# Patient Record
Sex: Male | Born: 1965 | Race: White | Hispanic: No | Marital: Married | Smoking: Never smoker
Health system: Southern US, Community
[De-identification: ages and names within clinical notes are randomized; demographics above are authoritative.]

## PROBLEM LIST (undated history)

## (undated) DIAGNOSIS — Z9989 Dependence on other enabling machines and devices: Secondary | ICD-10-CM

## (undated) DIAGNOSIS — G4730 Sleep apnea, unspecified: Secondary | ICD-10-CM

## (undated) DIAGNOSIS — E785 Hyperlipidemia, unspecified: Secondary | ICD-10-CM

## (undated) DIAGNOSIS — E669 Obesity, unspecified: Secondary | ICD-10-CM

## (undated) DIAGNOSIS — G4733 Obstructive sleep apnea (adult) (pediatric): Secondary | ICD-10-CM

## (undated) DIAGNOSIS — E119 Type 2 diabetes mellitus without complications: Secondary | ICD-10-CM

## (undated) DIAGNOSIS — T7840XA Allergy, unspecified, initial encounter: Secondary | ICD-10-CM

## (undated) HISTORY — DX: Obesity, unspecified: E66.9

## (undated) HISTORY — DX: Sleep apnea, unspecified: G47.30

## (undated) HISTORY — PX: TRIGGER FINGER RELEASE: SHX641

## (undated) HISTORY — DX: Type 2 diabetes mellitus without complications: E11.9

## (undated) HISTORY — DX: Hyperlipidemia, unspecified: E78.5

## (undated) HISTORY — DX: Allergy, unspecified, initial encounter: T78.40XA

## (undated) HISTORY — PX: ANAL FISSURE REPAIR: SHX2312

## (undated) HISTORY — DX: Obstructive sleep apnea (adult) (pediatric): G47.33

## (undated) HISTORY — DX: Dependence on other enabling machines and devices: Z99.89

---
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
## 2004-09-01 ENCOUNTER — Other Ambulatory Visit: Payer: Self-pay

## 2004-09-08 ENCOUNTER — Ambulatory Visit: Payer: Self-pay | Admitting: Specialist

## 2007-01-29 DIAGNOSIS — I10 Essential (primary) hypertension: Secondary | ICD-10-CM | POA: Insufficient documentation

## 2007-01-29 DIAGNOSIS — E785 Hyperlipidemia, unspecified: Secondary | ICD-10-CM | POA: Insufficient documentation

## 2007-01-29 HISTORY — DX: Essential (primary) hypertension: I10

## 2007-11-19 ENCOUNTER — Ambulatory Visit: Payer: Self-pay | Admitting: Family Medicine

## 2007-11-19 DIAGNOSIS — L709 Acne, unspecified: Secondary | ICD-10-CM | POA: Insufficient documentation

## 2008-05-21 DIAGNOSIS — Z8042 Family history of malignant neoplasm of prostate: Secondary | ICD-10-CM | POA: Insufficient documentation

## 2010-06-12 DIAGNOSIS — E119 Type 2 diabetes mellitus without complications: Secondary | ICD-10-CM | POA: Insufficient documentation

## 2010-12-01 ENCOUNTER — Ambulatory Visit: Payer: Self-pay | Admitting: Gastroenterology

## 2010-12-01 LAB — HM COLONOSCOPY

## 2010-12-05 LAB — PATHOLOGY REPORT

## 2011-04-11 ENCOUNTER — Ambulatory Visit: Payer: Self-pay | Admitting: Gastroenterology

## 2012-06-13 ENCOUNTER — Ambulatory Visit: Payer: Self-pay | Admitting: Gastroenterology

## 2012-06-18 LAB — PATHOLOGY REPORT

## 2013-08-11 ENCOUNTER — Ambulatory Visit: Payer: Self-pay | Admitting: Family Medicine

## 2013-10-09 ENCOUNTER — Ambulatory Visit: Payer: Self-pay | Admitting: Family Medicine

## 2014-01-22 LAB — HEMOGLOBIN A1C: Hgb A1c MFr Bld: 7.4 % — AB (ref 4.0–6.0)

## 2014-08-01 HISTORY — PX: OTHER SURGICAL HISTORY: SHX169

## 2014-10-18 LAB — LIPID PANEL
Cholesterol: 99 mg/dL (ref 0–200)
HDL: 25 mg/dL — AB (ref 35–70)
LDL Cholesterol: 43 mg/dL
Triglycerides: 154 mg/dL (ref 40–160)

## 2015-03-05 ENCOUNTER — Other Ambulatory Visit: Payer: Self-pay | Admitting: Family Medicine

## 2015-03-14 ENCOUNTER — Encounter: Payer: Self-pay | Admitting: Family Medicine

## 2015-03-14 ENCOUNTER — Encounter (INDEPENDENT_AMBULATORY_CARE_PROVIDER_SITE_OTHER): Payer: Self-pay

## 2015-03-14 ENCOUNTER — Ambulatory Visit (INDEPENDENT_AMBULATORY_CARE_PROVIDER_SITE_OTHER): Payer: BC Managed Care – PPO | Admitting: Family Medicine

## 2015-03-14 VITALS — BP 122/82 | HR 78 | Temp 98.8°F | Resp 16 | Ht 72.0 in | Wt 256.1 lb

## 2015-03-14 DIAGNOSIS — E785 Hyperlipidemia, unspecified: Secondary | ICD-10-CM | POA: Diagnosis not present

## 2015-03-14 DIAGNOSIS — E349 Endocrine disorder, unspecified: Secondary | ICD-10-CM

## 2015-03-14 DIAGNOSIS — I10 Essential (primary) hypertension: Secondary | ICD-10-CM | POA: Diagnosis not present

## 2015-03-14 DIAGNOSIS — Z8371 Family history of colonic polyps: Secondary | ICD-10-CM | POA: Insufficient documentation

## 2015-03-14 DIAGNOSIS — E669 Obesity, unspecified: Secondary | ICD-10-CM

## 2015-03-14 DIAGNOSIS — E119 Type 2 diabetes mellitus without complications: Secondary | ICD-10-CM

## 2015-03-14 DIAGNOSIS — E668 Other obesity: Secondary | ICD-10-CM | POA: Insufficient documentation

## 2015-03-14 DIAGNOSIS — K2100 Gastro-esophageal reflux disease with esophagitis, without bleeding: Secondary | ICD-10-CM | POA: Insufficient documentation

## 2015-03-14 DIAGNOSIS — Z8639 Personal history of other endocrine, nutritional and metabolic disease: Secondary | ICD-10-CM

## 2015-03-14 DIAGNOSIS — R809 Proteinuria, unspecified: Secondary | ICD-10-CM | POA: Insufficient documentation

## 2015-03-14 HISTORY — DX: Obesity, unspecified: E66.9

## 2015-03-14 LAB — GLUCOSE, POCT (MANUAL RESULT ENTRY): POC Glucose: 169 mg/dl — AB (ref 70–99)

## 2015-03-14 LAB — POCT GLYCOSYLATED HEMOGLOBIN (HGB A1C): Hemoglobin A1C: 7.2

## 2015-03-14 MED ORDER — LOSARTAN POTASSIUM 100 MG PO TABS
100.0000 mg | ORAL_TABLET | Freq: Every day | ORAL | Status: DC
Start: 2015-03-14 — End: 2015-12-05

## 2015-03-14 MED ORDER — ICOSAPENT ETHYL 1 G PO CAPS: 2.0000 mg | ORAL_CAPSULE | Freq: Two times a day (BID) | ORAL | Status: DC

## 2015-03-14 MED ORDER — GLIMEPIRIDE 2 MG PO TABS: 2.0000 mg | ORAL_TABLET | Freq: Every day | ORAL | Status: DC

## 2015-03-14 MED ORDER — INVOKANA 300 MG PO TABS
300.0000 mg | ORAL_TABLET | Freq: Every day | ORAL | Status: DC
Start: 2015-03-14 — End: 2015-11-15

## 2015-03-14 NOTE — Patient Instructions (Signed)
sietDiabetes Mellitus and Food It is important for you to manage your blood sugar (glucose) level. Your blood glucose level can be greatly affected by what you eat. Eating healthier foods in the appropriate amounts throughout the day at about the same time each day will help you control your blood glucose level. It can also help slow or prevent worsening of your diabetes mellitus. Healthy eating may even help you improve the level of your blood pressure and reach or maintain a healthy weight.  HOW CAN FOOD AFFECT ME? Carbohydrates Carbohydrates affect your blood glucose level more than any other type of food. Your dietitian will help you determine how many carbohydrates to eat at each meal and teach you how to count carbohydrates. Counting carbohydrates is important to keep your blood glucose at a healthy level, especially if you are using insulin or taking certain medicines for diabetes mellitus. Alcohol Alcohol can cause sudden decreases in blood glucose (hypoglycemia), especially if you use insulin or take certain medicines for diabetes mellitus. Hypoglycemia can be a life-threatening condition. Symptoms of hypoglycemia (sleepiness, dizziness, and disorientation) are similar to symptoms of having too much alcohol.  If your health care provider has given you approval to drink alcohol, do so in moderation and use the following guidelines:  Women should not have more than one drink per day, and men should not have more than two drinks per day. One drink is equal to:  12 oz of beer.  5 oz of wine.  1 oz of hard liquor.  Do not drink on an empty stomach.  Keep yourself hydrated. Have water, diet soda, or unsweetened iced tea.  Regular soda, juice, and other mixers might contain a lot of carbohydrates and should be counted. WHAT FOODS ARE NOT RECOMMENDED? As you make food choices, it is important to remember that all foods are not the same. Some foods have fewer nutrients per serving than other  foods, even though they might have the same number of calories or carbohydrates. It is difficult to get your body what it needs when you eat foods with fewer nutrients. Examples of foods that you should avoid that are high in calories and carbohydrates but low in nutrients include:  Trans fats (most processed foods list trans fats on the Nutrition Facts label).  Regular soda.  Juice.  Candy.  Sweets, such as cake, pie, doughnuts, and cookies.  Fried foods. WHAT FOODS CAN I EAT? Have nutrient-rich foods, which will nourish your body and keep you healthy. The food you should eat also will depend on several factors, including:  The calories you need.  The medicines you take.  Your weight.  Your blood glucose level.  Your blood pressure level.  Your cholesterol level. You also should eat a variety of foods, including:  Protein, such as meat, poultry, fish, tofu, nuts, and seeds (lean animal proteins are best).  Fruits.  Vegetables.  Dairy products, such as milk, cheese, and yogurt (low fat is best).  Breads, grains, pasta, cereal, rice, and beans.  Fats such as olive oil, trans fat-free margarine, canola oil, avocado, and olives. DOES EVERYONE WITH DIABETES MELLITUS HAVE THE SAME MEAL PLAN? Because every person with diabetes mellitus is different, there is not one meal plan that works for everyone. It is very important that you meet with a dietitian who will help you create a meal plan that is just right for you. Document Released: 06/14/2005 Document Revised: 09/22/2013 Document Reviewed: 08/14/2013 [facility] Patient Information 2015 [place], [place]. This  information is not intended to replace advice given to you by your health care provider. Make sure you discuss any questions you have with your health care provider.  

## 2015-03-14 NOTE — Progress Notes (Signed)
Name: Ryan Ritter   MRN: [id number]    DOB: [birth year]   Date:03/14/2015       Progress Note  Subjective  Chief Complaint  Chief Complaint  Patient presents with  . Hypertension  . Diabetes  . Hyperlipidemia  . Obesity  . Sleep Apnea    Hypertension This is a chronic problem. The current episode started more than 1 year ago. The problem is controlled. Pertinent negatives include no blurred vision, chest pain, headaches, neck pain, orthopnea, palpitations or shortness of breath. There are no associated agents to hypertension. Risk factors for coronary artery disease include diabetes mellitus, dyslipidemia, obesity and sedentary lifestyle. Past treatments include angiotensin blockers. The current treatment provides moderate improvement. There is no history of kidney disease, CAD/MI, CVA or PVD.  Diabetes He presents for his follow-up diabetic visit. He has type 2 diabetes mellitus. His disease course has been worsening. There are no hypoglycemic associated symptoms. Pertinent negatives for hypoglycemia include no dizziness, headaches, nervousness/anxiousness, seizures or tremors. There are no diabetic associated symptoms. Pertinent negatives for diabetes include no blurred vision, no chest pain, no weakness and no weight loss. Symptoms are worsening. There are no diabetic complications. Pertinent negatives for diabetic complications include no CVA or PVD. Current diabetic treatment includes oral agent (triple therapy). He is compliant with treatment all of the time. His weight is decreasing steadily. He is following a diabetic diet. He rarely participates in exercise. His home blood glucose trend is decreasing steadily. His breakfast blood glucose range is generally 130-140 mg/dl. His highest blood glucose is 180-200 mg/dl. An ACE inhibitor/angiotensin II receptor blocker is being taken. He does not see a podiatrist.Eye exam is current.  Hyperlipidemia This is a chronic problem. The current episode  started more than 1 year ago. The problem is uncontrolled. Recent lipid tests were reviewed and are variable. Exacerbating diseases include diabetes and obesity. Pertinent negatives include no chest pain, focal weakness, myalgias or shortness of breath. Current antihyperlipidemic treatment includes statins (VASCEPA).      Past Medical History  Diagnosis Date  . Sleep apnea   . Allergy   . Hyperlipidemia   . Diabetes mellitus without complication   . Hypertension     History  Substance Use Topics  . Smoking status: Never Smoker   . Smokeless tobacco: Not on file  . Alcohol Use: No     Current outpatient prescriptions:  .  ANDROGEL PUMP 20.25 MG/ACT (1.62%) GEL, , Disp: , Rfl:  .  CRESTOR 20 MG tablet, , Disp: , Rfl:  .  doxycycline (VIBRAMYCIN) 100 MG capsule, , Disp: , Rfl:  .  glimepiride (AMARYL) 2 MG tablet, , Disp: , Rfl:  .  INVOKANA 300 MG TABS tablet, , Disp: , Rfl:  .  KOMBIGLYZE XR 01-999 MG TB24, , Disp: , Rfl:  .  levocetirizine (XYZAL) 5 MG tablet, , Disp: , Rfl:  .  losartan (COZAAR) 100 MG tablet, , Disp: , Rfl:  .  VASCEPA 1 G CAPS, TAKE 2 CAPSULES EVERY MORNING AND 2 CAPSULES EVERY EVENING, Disp: 120 capsule, Rfl: 1 .  VICTOZA 18 MG/3ML SOPN, , Disp: , Rfl:   Allergies  Allergen Reactions  . Benicar [Olmesartan] Other (See Comments)  . Lisinopril Other (See Comments)    Review of Systems  Constitutional: Negative for fever, chills and weight loss.  HENT: Negative for congestion, hearing loss, sore throat and tinnitus.   Eyes: Negative for blurred vision, double vision and redness.  Respiratory: Negative for  cough, hemoptysis and shortness of breath.   Cardiovascular: Negative for chest pain, palpitations, orthopnea, claudication and leg swelling.  Gastrointestinal: Negative for heartburn, nausea, vomiting, diarrhea, constipation and blood in stool.  Genitourinary: Negative for dysuria, urgency, frequency and hematuria.  Musculoskeletal: Negative for  myalgias, back pain, joint pain, falls and neck pain.  Skin: Negative for itching.  Neurological: Negative for dizziness, tingling, tremors, focal weakness, seizures, loss of consciousness, weakness and headaches.  Endo/Heme/Allergies: Does not bruise/bleed easily.  Psychiatric/Behavioral: Negative for depression and substance abuse. The patient is not nervous/anxious and does not have insomnia.      Objective  Filed Vitals:   03/14/15 0835  BP: 122/82  Pulse: 78  Temp: 98.8 F (37.1 C)  Resp: 16  Height: 6' (1.829 m)  Weight: 256 lb 1 oz (116.149 kg)  SpO2: 99%     Physical Exam  Constitutional: He is oriented to person, place, and time and well-developed, well-nourished, and in no distress.  HENT:  Head: Normocephalic.  Eyes: EOM are normal. Pupils are equal, round, and reactive to light.  Neck: Normal range of motion. Neck supple. No thyromegaly present.  Cardiovascular: Normal rate, regular rhythm and normal heart sounds.   No murmur heard. Pulmonary/Chest: Effort normal and breath sounds normal. No respiratory distress. He has no wheezes.  Abdominal: Soft. Bowel sounds are normal.  Musculoskeletal: Normal range of motion. He exhibits no edema.  Lymphadenopathy:    He has no cervical adenopathy.  Neurological: He is alert and oriented to person, place, and time. No cranial nerve deficit. Gait normal. Coordination normal.  Skin: Skin is warm and dry. No rash noted.  Psychiatric: Affect and judgment normal.      Assessment & Plan   1. Type 2 diabetes mellitus without complication Encourage diet compliance - POCT HgB A1C - POCT Glucose (CBG)  2. Benign essential HTN stable   3. Esophagitis, reflux stable   4. Hypogonadal obesity Encourage diet compliance

## 2015-04-08 ENCOUNTER — Ambulatory Visit: Payer: BC Managed Care – PPO | Admitting: Family Medicine

## 2015-04-08 ENCOUNTER — Ambulatory Visit (INDEPENDENT_AMBULATORY_CARE_PROVIDER_SITE_OTHER): Payer: BC Managed Care – PPO | Admitting: Family Medicine

## 2015-04-08 ENCOUNTER — Telehealth: Payer: Self-pay | Admitting: Emergency Medicine

## 2015-04-08 ENCOUNTER — Ambulatory Visit
Admission: RE | Admit: 2015-04-08 | Discharge: 2015-04-08 | Disposition: A | Discharge status: Home / Self Care | Payer: BC Managed Care – PPO | Source: Ambulatory Visit | Attending: Family Medicine | Admitting: Family Medicine

## 2015-04-08 ENCOUNTER — Encounter: Payer: Self-pay | Admitting: Family Medicine

## 2015-04-08 VITALS — BP 144/84 | HR 85 | Temp 97.8°F | Resp 16 | Ht 72.0 in | Wt 247.0 lb

## 2015-04-08 DIAGNOSIS — R109 Unspecified abdominal pain: Secondary | ICD-10-CM | POA: Diagnosis not present

## 2015-04-08 DIAGNOSIS — R1013 Epigastric pain: Secondary | ICD-10-CM

## 2015-04-08 DIAGNOSIS — R197 Diarrhea, unspecified: Secondary | ICD-10-CM

## 2015-04-08 LAB — POCT URINALYSIS DIPSTICK
Bilirubin, UA: NEGATIVE
Glucose, UA: 100
Ketones, UA: 2
Leukocytes, UA: NEGATIVE
Nitrite, UA: NEGATIVE
Protein, UA: NEGATIVE
Spec Grav, UA: 1.01
Urobilinogen, UA: 0.2
pH, UA: 5.5

## 2015-04-08 MED ORDER — LEVOFLOXACIN 500 MG PO TABS: 500.0000 mg | ORAL_TABLET | Freq: Every day | ORAL | Status: DC

## 2015-04-08 NOTE — Patient Instructions (Addendum)
DASH Eating Plan DASH stands for "Dietary Approaches to Stop Hypertension." The DASH eating plan is a healthy eating plan that has been shown to reduce high blood pressure (hypertension). Additional health benefits may include reducing the risk of type 2 diabetes mellitus, heart disease, and stroke. The DASH eating plan may also help with weight loss. WHAT DO I NEED TO KNOW ABOUT THE DASH EATING PLAN? For the DASH eating plan, you will follow these general guidelines:  Choose foods with a percent daily value for sodium of less than 5% (as listed on the food label).  Use salt-free seasonings or herbs instead of table salt or sea salt.  Check with your health care provider or pharmacist before using salt substitutes.  Eat lower-sodium products, often labeled as "lower sodium" or "no salt added."  Eat fresh foods.  Eat more vegetables, fruits, and low-fat dairy products.  Choose whole grains. Look for the word "whole" as the first word in the ingredient list.  Choose fish and skinless chicken or turkey more often than red meat. Limit fish, poultry, and meat to 6 oz (170 g) each day.  Limit sweets, desserts, sugars, and sugary drinks.  Choose heart-healthy fats.  Limit cheese to 1 oz (28 g) per day.  Eat more home-cooked food and less restaurant, buffet, and fast food.  Limit fried foods.  Cook foods using methods other than frying.  Limit canned vegetables. If you do use them, rinse them well to decrease the sodium.  When eating at a restaurant, ask that your food be prepared with less salt, or no salt if possible. WHAT FOODS CAN I EAT? Seek help from a dietitian for individual calorie needs. Grains Whole grain or whole wheat bread. Brown rice. Whole grain or whole wheat pasta. Quinoa, bulgur, and whole grain cereals. Low-sodium cereals. Corn or whole wheat flour tortillas. Whole grain cornbread. Whole grain crackers. Low-sodium crackers. Vegetables Fresh or frozen vegetables  (raw, steamed, roasted, or grilled). Low-sodium or reduced-sodium tomato and vegetable juices. Low-sodium or reduced-sodium tomato sauce and paste. Low-sodium or reduced-sodium canned vegetables.  Fruits All fresh, canned (in natural juice), or frozen fruits. Meat and Other Protein Products Ground beef (85% or leaner), grass-fed beef, or beef trimmed of fat. Skinless chicken or turkey. Ground chicken or turkey. Pork trimmed of fat. All fish and seafood. Eggs. Dried beans, peas, or lentils. Unsalted nuts and seeds. Unsalted canned beans. Dairy Low-fat dairy products, such as skim or 1% milk, 2% or reduced-fat cheeses, low-fat ricotta or cottage cheese, or plain low-fat yogurt. Low-sodium or reduced-sodium cheeses. Fats and Oils Tub margarines without trans fats. Light or reduced-fat mayonnaise and salad dressings (reduced sodium). Avocado. Safflower, olive, or canola oils. Natural peanut or almond butter. Other Unsalted popcorn and pretzels. The items listed above may not be a complete list of recommended foods or beverages. Contact your dietitian for more options. WHAT FOODS ARE NOT RECOMMENDED? Grains White bread. White pasta. White rice. Refined cornbread. Bagels and croissants. Crackers that contain trans fat. Vegetables Creamed or fried vegetables. Vegetables in a cheese sauce. Regular canned vegetables. Regular canned tomato sauce and paste. Regular tomato and vegetable juices. Fruits Dried fruits. Canned fruit in light or heavy syrup. Fruit juice. Meat and Other Protein Products Fatty cuts of meat. Ribs, chicken wings, bacon, sausage, bologna, salami, chitterlings, fatback, hot dogs, bratwurst, and packaged luncheon meats. Salted nuts and seeds. Canned beans with salt. Dairy Whole or 2% milk, cream, half-and-half, and cream cheese. Whole-fat or sweetened yogurt. Full-fat   cheeses or blue cheese. Nondairy creamers and whipped toppings. Processed cheese, cheese spreads, or cheese  curds. Condiments Onion and garlic salt, seasoned salt, table salt, and sea salt. Canned and packaged gravies. Worcestershire sauce. Tartar sauce. Barbecue sauce. Teriyaki sauce. Soy sauce, including reduced sodium. Steak sauce. Fish sauce. Oyster sauce. Cocktail sauce. Horseradish. Ketchup and mustard. Meat flavorings and tenderizers. Bouillon cubes. Hot sauce. Tabasco sauce. Marinades. Taco seasonings. Relishes. Fats and Oils Butter, stick margarine, lard, shortening, ghee, and bacon fat. Coconut, palm kernel, or palm oils. Regular salad dressings. Other Pickles and olives. Salted popcorn and pretzels. The items listed above may not be a complete list of foods and beverages to avoid. Contact your dietitian for more information. WHERE CAN I FIND MORE INFORMATION? National Heart, Lung, and Blood Institute: [employer].itwww.nhlbi.nih.gov/health/health-topics/topics/dash/ Document Released: 09/06/2011 Document Revised: 02/01/2014 Document Reviewed: 07/22/2013 [facility] Patient Information 2015 [place], [place]. This information is not intended to replace advice given to you by your health care provider. Make sure you discuss any questions you have with your health care provider. Chronic Diarrhea Diarrhea is frequent loose and watery bowel movements. It can cause you to feel weak and dehydrated. Dehydration can cause you to become tired and thirsty and to have a dry mouth, decreased urination, and dark yellow urine. Diarrhea is a sign of another problem, most often an infection that will not last long. In most cases, diarrhea lasts 2-3 days. Diarrhea that lasts longer than 4 weeks is called long-lasting (chronic) diarrhea. It is important to treat your diarrhea as directed by your health care provider to lessen or prevent future episodes of diarrhea.  CAUSES  There are many causes of chronic diarrhea. The following are some possible causes:   Gastrointestinal infections caused by viruses, bacteria, or parasites.    Food poisoning or food allergies.   Certain medicines, such as antibiotics, chemotherapy, and laxatives.   Artificial sweeteners and fructose.   Digestive disorders, such as celiac disease and inflammatory bowel diseases.   Irritable bowel syndrome.  Some disorders of the pancreas.  Disorders of the thyroid.  Reduced blood flow to the intestines.  Cancer. Sometimes the cause of chronic diarrhea is unknown. RISK FACTORS  Having a severely weakened immune system, such as from HIV or AIDS.   Taking certain types of cancer-fighting drugs (such as with chemotherapy) or other medicines.   Having had a recent organ transplant.   Having a portion of the stomach or small bowel removed.   Traveling to countries where food and water supplies are often contaminated.  SYMPTOMS  In addition to frequent, loose stools, diarrhea may cause:   Cramping.   Abdominal pain.   Nausea.   Fever.  Fatigue.  Urgent need to use the bathroom.  Loss of bowel control. DIAGNOSIS  Your health care provider must take a careful history and perform a physical exam. Tests given are based on your symptoms and history. Tests may include:   Blood or stool tests. Three or more stool samples may be examined. Stool cultures may be used to test for bacteria or parasites.   X-rays.   A procedure in which a thin tube is inserted into the mouth or rectum (endoscopy). This allows the health care provider to look inside the intestine.  TREATMENT   Treatment is aimed at correcting the cause of the diarrhea when possible.  Diarrhea caused by an infection can often be treated with antibiotic medicines.  Diarrhea not caused by an infection may require you to take long-term  medicine or have surgery. Specific treatment should be discussed with your health care provider.  If the cause cannot be determined, treatment aims to relieve symptoms and prevent dehydration. Serious health problems  can occur if you do not maintain proper fluid levels. Treatment may include:  Taking an oral rehydration solution (ORS).  Not drinking beverages that contain caffeine (such as tea, coffee, and soft drinks).  Not drinking alcohol.  Maintaining well-balanced nutrition to help you recover faster. HOME CARE INSTRUCTIONS   Drink enough fluids to keep urine clear or pale yellow. Drink 1 cup (8 oz) of fluid for each diarrhea episode. Avoid fluids that contain simple sugars, fruit juices, whole milk products, and sodas. Hydrate with an ORS. You may purchase the ORS or prepare it at home by mixing the following ingredients together:   - tsp (1.7-3  mL) table salt.   tsp (3  mL) baking soda.   tsp (1.7 mL) salt substitute containing potassium chloride.  1 tbsp (20 mL) sugar.  4.2 c (1 L) of water.   Certain foods and beverages may increase the speed at which food moves through the gastrointestinal (GI) tract. These foods and beverages should be avoided. They include:  Caffeinated and alcoholic beverages.  High-fiber foods, such as raw fruits and vegetables, nuts, seeds, and whole grain breads and cereals.  Foods and beverages sweetened with sugar alcohols, such as xylitol, sorbitol, and mannitol.   Some foods may be well tolerated and may help thicken stool. These include:  Starchy foods, such as rice, toast, pasta, low-sugar cereal, oatmeal, grits, baked potatoes, crackers, and bagels.  Bananas.  Applesauce.  Add probiotic-rich foods to help increase healthy bacteria in the GI tract. These include yogurt and fermented milk products.  Wash your hands well after each diarrhea episode.  Only take over-the-counter or prescription medicines as directed by your health care provider.  Take a warm bath to relieve any burning or pain from frequent diarrhea episodes. SEEK MEDICAL CARE IF:   You are not urinating as often.  Your urine is a dark color.  You become very tired or  dizzy.  You have severe pain in the abdomen or rectum.  Your have blood or pus in your stools.  Your stools look black and tarry. SEEK IMMEDIATE MEDICAL CARE IF:   You are unable to keep fluids down.  You have persistent vomiting.  You have blood in your stool.  Your stools are black and tarry.  You do not urinate in 6-8 hours, or there is only a small amount of very dark urine.  You have abdominal pain that increases or localizes.  You have weakness, dizziness, confusion, or lightheadedness.  You have a severe headache.  Your diarrhea gets worse or does not get better.  You have a fever or persistent symptoms for more than 2-3 days.  You have a fever and your symptoms suddenly get worse. MAKE SURE YOU:   Understand these instructions.  Will watch your condition.  Will get help right away if you are not doing well or get worse. Document Released: 12/08/2003 Document Revised: 09/22/2013 Document Reviewed: 03/12/2013 [facility] Patient Information 2015 [place], [place]. This information is not intended to replace advice given to you by your health care provider. Make sure you discuss any questions you have with your health care provider.

## 2015-04-08 NOTE — Progress Notes (Signed)
Name: Ryan Ritter   MRN: [id number]    DOB: [birth year]   Date:04/08/2015       Progress Note  Subjective  Chief Complaint  Chief Complaint  Patient presents with  . Abdominal Pain    pt family has had stomach bug within the last week  . Nausea  . Fever    Abdominal Pain This is a new problem. The current episode started in the past 7 days. The onset quality is sudden. The problem occurs constantly. The problem has been gradually worsening. The pain is located in the generalized abdominal region. The pain is moderate. The quality of the pain is aching. Associated symptoms include constipation, diarrhea, a fever and weight loss. Pertinent negatives include no dysuria, frequency, headaches, hematochezia, hematuria, melena, myalgias, nausea or vomiting. The pain is aggravated by eating. The pain is relieved by nothing. He has tried antacids for the symptoms. The treatment provided no relief.  Fever  This is a new problem. The current episode started in the past 7 days. The problem occurs daily. The problem has been unchanged. The maximum temperature noted was 100 to 100.9 F. The temperature was taken using an oral thermometer. Associated symptoms include abdominal pain and diarrhea. Pertinent negatives include no chest pain, congestion, coughing, headaches, nausea, sore throat, urinary pain or vomiting. Associated symptoms comments: Diarrhea. The treatment provided no relief.      Past Medical History  Diagnosis Date  . Sleep apnea   . Allergy   . Hyperlipidemia   . Diabetes mellitus without complication   . Hypertension     History  Substance Use Topics  . Smoking status: Never Smoker   . Smokeless tobacco: Not on file  . Alcohol Use: No     Current outpatient prescriptions:  .  ANDROGEL PUMP 20.25 MG/ACT (1.62%) GEL, , Disp: , Rfl:  .  CRESTOR 20 MG tablet, , Disp: , Rfl:  .  doxycycline (VIBRAMYCIN) 100 MG capsule, , Disp: , Rfl:  .  glimepiride (AMARYL) 2 MG tablet, Take 1  tablet (2 mg total) by mouth daily with breakfast., Disp: 30 tablet, Rfl: 5 .  Icosapent Ethyl (VASCEPA) 1 G CAPS, Take 2 mg by mouth 2 (two) times daily., Disp: 120 capsule, Rfl: 1 .  INVOKANA 300 MG TABS tablet, Take 300 mg by mouth daily before breakfast., Disp: 30 tablet, Rfl: 5 .  KOMBIGLYZE XR 01-999 MG TB24, , Disp: , Rfl:  .  levocetirizine (XYZAL) 5 MG tablet, , Disp: , Rfl:  .  losartan (COZAAR) 100 MG tablet, Take 1 tablet (100 mg total) by mouth daily., Disp: 30 tablet, Rfl: 5 .  triamcinolone cream (KENALOG) 0.1 %, APPLY A SMALL AMOUNT TO RASH OR INSECT BITES FOUR TIMES A DAY, Disp: , Rfl: 0 .  VICTOZA 18 MG/3ML SOPN, , Disp: , Rfl:   Allergies  Allergen Reactions  . Benicar [Olmesartan] Other (See Comments)  . Lisinopril Other (See Comments)    Review of Systems  Constitutional: Positive for fever and weight loss. Negative for chills.  HENT: Negative for congestion, hearing loss, sore throat and tinnitus.   Eyes: Negative for blurred vision, double vision and redness.  Respiratory: Negative for cough, hemoptysis and shortness of breath.   Cardiovascular: Negative for chest pain, palpitations, orthopnea, claudication and leg swelling.  Gastrointestinal: Positive for abdominal pain, diarrhea and constipation. Negative for heartburn, nausea, vomiting, blood in stool, melena and hematochezia.  Genitourinary: Negative for dysuria, urgency, frequency and hematuria.  Musculoskeletal: Negative  for myalgias, back pain, joint pain, falls and neck pain.  Skin: Negative for itching.  Neurological: Negative for dizziness, tingling, tremors, focal weakness, seizures, loss of consciousness, weakness and headaches.  Endo/Heme/Allergies: Does not bruise/bleed easily.  Psychiatric/Behavioral: Negative for depression and substance abuse. The patient is not nervous/anxious and does not have insomnia.      Objective  Filed Vitals:   04/08/15 0830  BP: 144/84  Pulse: 85  Temp: 97.8 F  (36.6 C)  Resp: 16  Height: 6' (1.829 m)  Weight: 247 lb (112.038 kg)  SpO2: 97%     Physical Exam  Constitutional: He is oriented to person, place, and time and well-developed, well-nourished, and in no distress.  Obese  HENT:  Head: Normocephalic.  Eyes: EOM are normal. Pupils are equal, round, and reactive to light.  Neck: Normal range of motion. Neck supple. No thyromegaly present.  Cardiovascular: Normal rate, regular rhythm and normal heart sounds.   No murmur heard. Pulmonary/Chest: Effort normal and breath sounds normal. No respiratory distress. He has no wheezes.  Abdominal: He exhibits no mass. There is tenderness. There is no rebound.  Bowel sounds hyperactive mild generalized tenderness no rebound  Genitourinary: Rectum normal. Guaiac negative stool.  Musculoskeletal: Normal range of motion. He exhibits no edema.  Lymphadenopathy:    He has no cervical adenopathy.  Neurological: He is alert and oriented to person, place, and time. No cranial nerve deficit. Gait normal. Coordination normal.  Skin: Skin is warm and dry. No rash noted.  Psychiatric: Affect and judgment normal.      Assessment & Plan  1. Epigastric pain See below - POC Hemoccult Bld/Stl (1-Cd Office Dx) - POCT urinalysis dipstick - Stool C-Diff Toxin Assay - Stool Culture - CBC - Sed Rate (ESR) - DG Abd 2 Views; Future  2. Diarrhea Differential diagnosis includes infectious process versus inflammatory bowel disease - POC Hemoccult Bld/Stl (1-Cd Office Dx) - POCT urinalysis dipstick - Stool C-Diff Toxin Assay - Stool Culture - CBC - Sed Rate (ESR) - levofloxacin (LEVAQUIN) 500 MG tablet; Take 1 tablet (500 mg total) by mouth daily.  Dispense: 7 tablet; Refill: 0 - DG Abd 2 Views; Future

## 2015-04-08 NOTE — Telephone Encounter (Signed)
Patient notified of xray results and OTC.

## 2015-04-09 LAB — CBC
Hematocrit: 46.1 % (ref 37.5–51.0)
Hemoglobin: 16 g/dL (ref 12.6–17.7)
MCH: 29.9 pg (ref 26.6–33.0)
MCHC: 34.7 g/dL (ref 31.5–35.7)
MCV: 86 fL (ref 79–97)
Platelets: 137 10*3/uL — ABNORMAL LOW (ref 150–379)
RBC: 5.36 x10E6/uL (ref 4.14–5.80)
RDW: 13.7 % (ref 12.3–15.4)
WBC: 5.6 10*3/uL (ref 3.4–10.8)

## 2015-04-09 LAB — SEDIMENTATION RATE: Sed Rate: 2 mm/hr (ref 0–15)

## 2015-04-10 LAB — CLOSTRIDIUM DIFFICILE EIA: C difficile Toxins A+B, EIA: NEGATIVE

## 2015-04-11 ENCOUNTER — Encounter: Payer: Self-pay | Admitting: Family Medicine

## 2015-04-11 ENCOUNTER — Telehealth: Payer: Self-pay | Admitting: Family Medicine

## 2015-04-11 ENCOUNTER — Ambulatory Visit (INDEPENDENT_AMBULATORY_CARE_PROVIDER_SITE_OTHER): Payer: BC Managed Care – PPO | Admitting: Family Medicine

## 2015-04-11 VITALS — BP 118/70 | HR 100 | Temp 97.9°F | Resp 16 | Ht 72.0 in | Wt 248.4 lb

## 2015-04-11 DIAGNOSIS — K5909 Other constipation: Secondary | ICD-10-CM

## 2015-04-11 DIAGNOSIS — K5904 Chronic idiopathic constipation: Secondary | ICD-10-CM

## 2015-04-11 MED ORDER — LINACLOTIDE 145 MCG PO CAPS: 145.0000 ug | ORAL_CAPSULE | Freq: Every day | ORAL | Status: DC

## 2015-04-11 NOTE — Patient Instructions (Signed)

## 2015-04-11 NOTE — Progress Notes (Signed)
Name: Ryan Ritter   MRN: [id number]    DOB: 07-11-1966   Date:04/11/2015       Progress Note  Subjective  Chief Complaint  Chief Complaint  Patient presents with  . Abdominal Pain    pt here to discuss options his abdominal discomfort    Abdominal Pain This is a new problem. The current episode started 1 to 4 weeks ago. The onset quality is gradual. The problem occurs constantly. The problem has been gradually improving. The pain is located in the generalized abdominal region. The pain is mild. Associated symptoms include constipation, diarrhea and nausea. Pertinent negatives include no dysuria, fever, headaches, melena, vomiting or weight loss. The pain is aggravated by eating. The pain is relieved by bowel movements. Treatments tried: Magnesium citrate and MiraLAX. The treatment provided mild relief.      Past Medical History  Diagnosis Date  . Sleep apnea   . Allergy   . Hyperlipidemia   . Diabetes mellitus without complication   . Hypertension     History  Substance Use Topics  . Smoking status: Never Smoker   . Smokeless tobacco: Not on file  . Alcohol Use: No     Current outpatient prescriptions:  .  ANDROGEL PUMP 20.25 MG/ACT (1.62%) GEL, , Disp: , Rfl:  .  CRESTOR 20 MG tablet, , Disp: , Rfl:  .  doxycycline (VIBRAMYCIN) 100 MG capsule, , Disp: , Rfl:  .  glimepiride (AMARYL) 2 MG tablet, Take 1 tablet (2 mg total) by mouth daily with breakfast., Disp: 30 tablet, Rfl: 5 .  Icosapent Ethyl (VASCEPA) 1 G CAPS, Take 2 mg by mouth 2 (two) times daily., Disp: 120 capsule, Rfl: 1 .  INVOKANA 300 MG TABS tablet, Take 300 mg by mouth daily before breakfast., Disp: 30 tablet, Rfl: 5 .  KOMBIGLYZE XR 01-999 MG TB24, , Disp: , Rfl:  .  levocetirizine (XYZAL) 5 MG tablet, , Disp: , Rfl:  .  levofloxacin (LEVAQUIN) 500 MG tablet, Take 1 tablet (500 mg total) by mouth daily., Disp: 7 tablet, Rfl: 0 .  losartan (COZAAR) 100 MG tablet, Take 1 tablet (100 mg total) by mouth  daily., Disp: 30 tablet, Rfl: 5 .  triamcinolone cream (KENALOG) 0.1 %, APPLY A SMALL AMOUNT TO RASH OR INSECT BITES FOUR TIMES A DAY, Disp: , Rfl: 0 .  VICTOZA 18 MG/3ML SOPN, , Disp: , Rfl:   Allergies  Allergen Reactions  . Benicar [Olmesartan] Other (See Comments)  . Lisinopril Other (See Comments)    Review of Systems  Constitutional: Negative for fever, chills and weight loss.  Respiratory: Negative.   Cardiovascular: Negative.   Gastrointestinal: Positive for nausea, abdominal pain, diarrhea and constipation. Negative for vomiting, blood in stool and melena.  Genitourinary: Negative for dysuria.  Musculoskeletal: Negative for neck pain.  Neurological: Negative for headaches.  Endo/Heme/Allergies: Does not bruise/bleed easily.     Objective  Filed Vitals:   04/11/15 1509  BP: 118/70  Pulse: 100  Temp: 97.9 F (36.6 C)  Resp: 16  Height: 6' (1.829 m)  Weight: 248 lb 7 oz (112.691 kg)  SpO2: 98%     Physical Exam  Constitutional: He is well-developed, well-nourished, and in no distress.  Cardiovascular: Normal rate and regular rhythm.   Pulmonary/Chest: Effort normal and breath sounds normal.  Abdominal: Bowel sounds are normal.  Palpable stool in the transverse and descending colon      Assessment & Plan  1. Constipation - functional DD INCLUDES  CIS, AUTONOMIC DYSFUNCTION, OBSTRUCTING LESION LESS LIKELY 2ND TO RECENT COLONSOCOPY - Linaclotide (LINZESS) 145 MCG CAPS capsule; Take 1 capsule (145 mcg total) by mouth daily.  Dispense: 30 capsule; Refill: 0

## 2015-04-11 NOTE — Telephone Encounter (Signed)
Informed pt that we had a cancellation for 3 pm and pt agreed to come in at that time and discuss his concerns.

## 2015-04-11 NOTE — Telephone Encounter (Signed)
Pt was seen Friday and was told his colon was full. He is requesting an appointment today (because he is off work) to discuss this. Patient did take magnesium citrate but isn't for sure if it helped. Please [phone]

## 2015-04-25 ENCOUNTER — Encounter: Payer: Self-pay | Admitting: Family Medicine

## 2015-04-25 ENCOUNTER — Encounter (INDEPENDENT_AMBULATORY_CARE_PROVIDER_SITE_OTHER): Payer: Self-pay

## 2015-04-25 ENCOUNTER — Ambulatory Visit (INDEPENDENT_AMBULATORY_CARE_PROVIDER_SITE_OTHER): Payer: BC Managed Care – PPO | Admitting: Family Medicine

## 2015-04-25 VITALS — BP 118/76 | HR 84 | Temp 98.0°F | Resp 18 | Ht 72.0 in | Wt 245.0 lb

## 2015-04-25 DIAGNOSIS — K5901 Slow transit constipation: Secondary | ICD-10-CM | POA: Diagnosis not present

## 2015-04-25 NOTE — Patient Instructions (Addendum)
About Constipation  Constipation Overview Constipation is the most common gastrointestinal complaint - about 4 million Americans experience constipation and make 2.5 million physician visits a year to get help for the problem.  Constipation can occur when the colon absorbs too much water, the colon's muscle contraction is slow or sluggish, and/or there is delayed transit time through the colon.  The result is stool that is hard and dry.  Indicators of constipation include straining during bowel movements greater than 25% of the time, having fewer than three bowel movements per week, and/or the feeling of incomplete evacuation.  There are established guidelines ([place] II ) for defining constipation. A person needs to have two or more of the following symptoms for at least 12 weeks (not necessarily consecutive) in the preceding 12 months: . Straining in  greater than 25% of bowel movements . Lumpy or hard stools in greater than 25% of bowel movements . Sensation of incomplete emptying in greater than 25% of bowel movements . Sensation of anorectal obstruction/blockade in greater than 25% of bowel movements . Manual maneuvers to help empty greater than 25% of bowel movements (e.g., digital evacuation, support of the pelvic floor)  . Less than  3 bowel movements/week . Loose stools are not present, and criteria for irritable bowel syndrome are insufficient  Common Causes of Constipation . Lack of fiber in your diet . Lack of physical activity . Medications, including iron and calcium supplements  . Dairy intake . Dehydration . Abuse of laxatives  Travel  Irritable Bowel Syndrome  Pregnancy  Luteal phase of menstruation (after ovulation and before menses)  Colorectal problems  Intestinal Dysfunction  Treating Constipation  There are several ways of treating constipation, including changes to diet and exercise, use of laxatives, adjustments to the pelvic floor, and scheduled toileting.   These treatments include: . increasing fiber and fluids in the diet  . increasing physical activity . learning muscle coordination   learning proper toileting techniques and toileting modifications   designing and sticking  to a toileting schedule   OTC METAMUCIL QD-BID  2007, [employer] Doc.22

## 2015-04-25 NOTE — Progress Notes (Signed)
Name: Ryan Ritter   MRN: [id number]    DOB: [birth year]   Date:04/25/2015       Progress Note  Subjective  Chief Complaint  Chief Complaint  Patient presents with  . Abdominal Pain    2 week follow up    Abdominal Pain This is a new problem. The current episode started 1 to 4 weeks ago. The onset quality is gradual. Associated symptoms include constipation and diarrhea.  Constipation This is a new problem. The current episode started 1 to 4 weeks ago. The problem has been resolved since onset. His stool frequency is 2 to 3 times per week. The stool is described as formed and loose. The patient is on a high fiber diet. He does not exercise regularly. There has been adequate water intake. Associated symptoms include abdominal pain and diarrhea. Risk factors include dietary change. He has tried stool softeners for the symptoms. The treatment provided moderate relief.      Past Medical History  Diagnosis Date  . Sleep apnea   . Allergy   . Hyperlipidemia   . Diabetes mellitus without complication   . Hypertension     History  Substance Use Topics  . Smoking status: Never Smoker   . Smokeless tobacco: Not on file  . Alcohol Use: No     Current outpatient prescriptions:  .  ANDROGEL PUMP 20.25 MG/ACT (1.62%) GEL, , Disp: , Rfl:  .  CRESTOR 20 MG tablet, , Disp: , Rfl:  .  doxycycline (VIBRAMYCIN) 100 MG capsule, , Disp: , Rfl:  .  glimepiride (AMARYL) 2 MG tablet, Take 1 tablet (2 mg total) by mouth daily with breakfast., Disp: 30 tablet, Rfl: 5 .  Icosapent Ethyl (VASCEPA) 1 G CAPS, Take 2 mg by mouth 2 (two) times daily., Disp: 120 capsule, Rfl: 1 .  INVOKANA 300 MG TABS tablet, Take 300 mg by mouth daily before breakfast., Disp: 30 tablet, Rfl: 5 .  KOMBIGLYZE XR 01-999 MG TB24, , Disp: , Rfl:  .  levocetirizine (XYZAL) 5 MG tablet, , Disp: , Rfl:  .  levofloxacin (LEVAQUIN) 500 MG tablet, Take 1 tablet (500 mg total) by mouth daily., Disp: 7 tablet, Rfl: 0 .  Linaclotide  (LINZESS) 145 MCG CAPS capsule, Take 1 capsule (145 mcg total) by mouth daily., Disp: 30 capsule, Rfl: 0 .  losartan (COZAAR) 100 MG tablet, Take 1 tablet (100 mg total) by mouth daily., Disp: 30 tablet, Rfl: 5 .  triamcinolone cream (KENALOG) 0.1 %, APPLY A SMALL AMOUNT TO RASH OR INSECT BITES FOUR TIMES A DAY, Disp: , Rfl: 0 .  VICTOZA 18 MG/3ML SOPN, , Disp: , Rfl:   Allergies  Allergen Reactions  . Benicar [Olmesartan] Other (See Comments)  . Lisinopril Other (See Comments)    Review of Systems  Gastrointestinal: Positive for abdominal pain, diarrhea and constipation.     Objective  Filed Vitals:   04/25/15 0843  BP: 118/76  Pulse: 84  Temp: 98 F (36.7 C)  TempSrc: Oral  Resp: 18  Height: 6' (1.829 m)  Weight: 245 lb (111.131 kg)  SpO2: 98%     Physical Exam  Constitutional: He is oriented to person, place, and time and well-developed, well-nourished, and in no distress.  HENT:  Head: Normocephalic.  Eyes: EOM are normal. Pupils are equal, round, and reactive to light.  Neck: Normal range of motion. Neck supple. No thyromegaly present.  Cardiovascular: Normal rate, regular rhythm and normal heart sounds.   No murmur  heard. Pulmonary/Chest: Effort normal and breath sounds normal. No respiratory distress. He has no wheezes.  Abdominal: Soft. Bowel sounds are normal. He exhibits distension. He exhibits no mass. There is no tenderness. There is no rebound and no guarding.  Musculoskeletal: Normal range of motion. He exhibits no edema.  Lymphadenopathy:    He has no cervical adenopathy.  Neurological: He is alert and oriented to person, place, and time. No cranial nerve deficit. Gait normal. Coordination normal.  Skin: Skin is warm and dry. No rash noted.  Psychiatric: Affect and judgment normal.      Assessment & Plan   1. Constipation by delayed colonic transit Handout METAMUCIL OTC PRN

## 2015-06-07 ENCOUNTER — Other Ambulatory Visit: Payer: Self-pay | Admitting: Family Medicine

## 2015-06-20 ENCOUNTER — Ambulatory Visit: Payer: BC Managed Care – PPO | Admitting: Family Medicine

## 2015-06-27 ENCOUNTER — Ambulatory Visit: Payer: BC Managed Care – PPO | Admitting: Family Medicine

## 2015-06-30 ENCOUNTER — Ambulatory Visit: Payer: BC Managed Care – PPO | Admitting: Family Medicine

## 2015-07-04 ENCOUNTER — Encounter: Payer: Self-pay | Admitting: Family Medicine

## 2015-07-04 ENCOUNTER — Ambulatory Visit (INDEPENDENT_AMBULATORY_CARE_PROVIDER_SITE_OTHER): Payer: BC Managed Care – PPO | Admitting: Family Medicine

## 2015-07-04 ENCOUNTER — Other Ambulatory Visit: Payer: Self-pay | Admitting: Family Medicine

## 2015-07-04 VITALS — BP 128/64 | HR 97 | Temp 99.6°F | Resp 16 | Ht 72.0 in | Wt 254.4 lb

## 2015-07-04 DIAGNOSIS — E0829 Diabetes mellitus due to underlying condition with other diabetic kidney complication: Secondary | ICD-10-CM

## 2015-07-04 DIAGNOSIS — Z125 Encounter for screening for malignant neoplasm of prostate: Secondary | ICD-10-CM | POA: Diagnosis not present

## 2015-07-04 DIAGNOSIS — E785 Hyperlipidemia, unspecified: Secondary | ICD-10-CM

## 2015-07-04 DIAGNOSIS — E291 Testicular hypofunction: Secondary | ICD-10-CM | POA: Diagnosis not present

## 2015-07-04 DIAGNOSIS — R809 Proteinuria, unspecified: Secondary | ICD-10-CM

## 2015-07-04 LAB — POCT UA - MICROALBUMIN: Microalbumin Ur, POC: 20 mg/L

## 2015-07-04 LAB — POCT GLYCOSYLATED HEMOGLOBIN (HGB A1C): Hemoglobin A1C: 7.4

## 2015-07-04 LAB — GLUCOSE, POCT (MANUAL RESULT ENTRY): POC Glucose: 175 mg/dl — AB (ref 70–99)

## 2015-07-04 MED ORDER — LIRAGLUTIDE 18 MG/3ML ~~LOC~~ SOPN: PEN_INJECTOR | SUBCUTANEOUS | Status: DC

## 2015-07-04 MED ORDER — SAXAGLIPTIN-METFORMIN ER 5-1000 MG PO TB24: ORAL_TABLET | ORAL | Status: DC

## 2015-07-04 MED ORDER — TESTOSTERONE 12.5 MG/ACT (1%) TD GEL: 6.0000 | Freq: Every day | TRANSDERMAL | Status: DC

## 2015-07-04 MED ORDER — DOXYCYCLINE HYCLATE 100 MG PO CAPS: 100.0000 mg | ORAL_CAPSULE | Freq: Two times a day (BID) | ORAL | Status: DC

## 2015-07-04 MED ORDER — ANDROGEL PUMP 20.25 MG/ACT (1.62%) TD GEL: 1.0000 | TRANSDERMAL | Status: DC

## 2015-07-04 NOTE — Progress Notes (Signed)
Name: Ryan Ritter   MRN: [id number]    DOB: [birth year]   Date:07/04/2015       Progress Note  Subjective  Chief Complaint  Chief Complaint  Patient presents with  . Diabetes    pt here for 3 month follow up  . Hyperlipidemia    HPI   Diabetes  Patient presents for follow-up of diabetes which is present for over 5 years. Is currently on a regimen of Kombiglyze XR of 01-999 daily Victoza 18 mg IM  daily . Patient states partially compliant with their diet and exercise. There's been no hypoglycemic episodes and there is no polyuria polydipsia polyphagia. His average fasting glucoses been in the low around 1:30 with a high around 170 . There is no end organ disease.  Last diabetic eye exam was earlier this year.   Last visit with dietitian was earlier this year. Last microalbumin was obtained today and is normal at 20.   Hypertension   Patient presents for follow-up of hypertension. It has been present for over 5 years.  Patient states that there is compliance with medical regimen which consists of losartan 100 mg daily . There is no end organ disease. Cardiac risk factors include hypertension hyperlipidemia and diabetes.  Exercise regimen consist of minimal .  Diet consist of ADA is followed intermittently .  Hyperlipidemia  Patient has a history of hyperlipidemia for over 5 years.  Current medical regimen consist of Cipro, Crestor 20 mg daily .  Compliance is good .  Diet and exercise are currently followed intermittently .  Risk factors for cardiovascular disease include hyperlipidemia , hypertension, obesity, relative Lee's sedentary lifestyle .   There have been no side effects from the medication.    Hypogonadism  Patient has been on AndroGel for several years. Has not had his level checked within the last 6 months as his PSA being checked the last 6 months. He is informed that prescription benefit plans are less and less likely to refill prior authorizations for AndroGel as her  pattern is the past     Past Medical History  Diagnosis Date  . Sleep apnea   . Allergy   . Hyperlipidemia   . Diabetes mellitus without complication (HCC)   . Hypertension     Social History  Substance Use Topics  . Smoking status: Never Smoker   . Smokeless tobacco: Not on file  . Alcohol Use: No     Current outpatient prescriptions:  .  ANDROGEL PUMP 20.25 MG/ACT (1.62%) GEL, , Disp: , Rfl:  .  CRESTOR 20 MG tablet, , Disp: , Rfl:  .  doxycycline (VIBRAMYCIN) 100 MG capsule, , Disp: , Rfl:  .  glimepiride (AMARYL) 2 MG tablet, Take 1 tablet (2 mg total) by mouth daily with breakfast., Disp: 30 tablet, Rfl: 5 .  INVOKANA 300 MG TABS tablet, Take 300 mg by mouth daily before breakfast., Disp: 30 tablet, Rfl: 5 .  KOMBIGLYZE XR 01-999 MG TB24, TAKE TWO TABLETS EVERY DAY, Disp: 60 tablet, Rfl: 1 .  levocetirizine (XYZAL) 5 MG tablet, , Disp: , Rfl:  .  levofloxacin (LEVAQUIN) 500 MG tablet, Take 1 tablet (500 mg total) by mouth daily., Disp: 7 tablet, Rfl: 0 .  Linaclotide (LINZESS) 145 MCG CAPS capsule, Take 1 capsule (145 mcg total) by mouth daily., Disp: 30 capsule, Rfl: 0 .  losartan (COZAAR) 100 MG tablet, Take 1 tablet (100 mg total) by mouth daily., Disp: 30 tablet, Rfl: 5 .  triamcinolone  cream (KENALOG) 0.1 %, APPLY A SMALL AMOUNT TO RASH OR INSECT BITES FOUR TIMES A DAY, Disp: , Rfl: 0 .  VASCEPA 1 G CAPS, TAKE 2 CAPSULES BY MOUTH TWICE DAILY, Disp: 120 capsule, Rfl: 0 .  VICTOZA 18 MG/3ML SOPN, INJCET 1.8MG  SUBCUTANEOUSLY EVERY DAY, Disp: 9 mL, Rfl: 2  Allergies  Allergen Reactions  . Benicar [Olmesartan] Other (See Comments)  . Lisinopril Other (See Comments)    Review of Systems  Constitutional: Positive for malaise/fatigue. Negative for fever, chills and weight loss.  HENT: Positive for congestion. Negative for hearing loss, sore throat and tinnitus.   Eyes: Negative for blurred vision, double vision and redness.  Respiratory: Negative for cough, hemoptysis  and shortness of breath.   Cardiovascular: Negative for chest pain, palpitations, orthopnea, claudication and leg swelling.  Gastrointestinal: Negative for heartburn, nausea, vomiting, diarrhea, constipation and blood in stool.  Genitourinary: Negative for dysuria, urgency, frequency and hematuria.       Erectile dysfunction  Musculoskeletal: Positive for joint pain. Negative for myalgias, back pain, falls and neck pain.  Skin: Negative for itching.  Neurological: Negative for dizziness, tingling, tremors, focal weakness, seizures, loss of consciousness, weakness and headaches.  Endo/Heme/Allergies: Does not bruise/bleed easily.  Psychiatric/Behavioral: Negative for depression and substance abuse. The patient is not nervous/anxious and does not have insomnia.      Objective  Filed Vitals:   07/04/15 1359  BP: 128/64  Pulse: 97  Temp: 99.6 F (37.6 C)  Resp: 16  Height: 6' (1.829 m)  Weight: 254 lb 6 oz (115.384 kg)  SpO2: 98%     Physical Exam    Assessment & Plan   1. 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 Prostate cancer screening Labs today - PSA - Testosterone  3. Hyperlipidemia Labs needed - Comprehensive Metabolic Panel (CMET) - Lipid Profile - TSH  4. Hypogonadism, male PSA and testosterone levels today - PSA - Testosterone

## 2015-07-09 LAB — COMPREHENSIVE METABOLIC PANEL
ALT: 26 IU/L (ref 0–44)
AST: 23 IU/L (ref 0–40)
Albumin/Globulin Ratio: 1.9 (ref 1.1–2.5)
Albumin: 4.6 g/dL (ref 3.5–5.5)
Alkaline Phosphatase: 35 IU/L — ABNORMAL LOW (ref 39–117)
BUN/Creatinine Ratio: 18 (ref 9–20)
BUN: 16 mg/dL (ref 6–24)
Bilirubin Total: 1.2 mg/dL (ref 0.0–1.2)
CO2: 25 mmol/L (ref 18–29)
Calcium: 9.4 mg/dL (ref 8.7–10.2)
Chloride: 98 mmol/L (ref 97–108)
Creatinine, Ser: 0.87 mg/dL (ref 0.76–1.27)
GFR calc Af Amer: 117 mL/min/{1.73_m2} (ref >59)
GFR calc non Af Amer: 101 mL/min/{1.73_m2} (ref >59)
Globulin, Total: 2.4 g/dL (ref 1.5–4.5)
Glucose: 208 mg/dL — ABNORMAL HIGH (ref 65–99)
Potassium: 4.3 mmol/L (ref 3.5–5.2)
Sodium: 137 mmol/L (ref 134–144)
Total Protein: 7 g/dL (ref 6.0–8.5)

## 2015-07-09 LAB — PSA: Prostate Specific Ag, Serum: 0.5 ng/mL (ref 0.0–4.0)

## 2015-07-09 LAB — TESTOSTERONE: Testosterone: 126 ng/dL — ABNORMAL LOW (ref 348–1197)

## 2015-07-09 LAB — LIPID PANEL
Chol/HDL Ratio: 4 ratio units (ref 0.0–5.0)
Cholesterol, Total: 109 mg/dL (ref 100–199)
HDL: 27 mg/dL — ABNORMAL LOW (ref >39)
LDL Calculated: 46 mg/dL (ref 0–99)
Triglycerides: 180 mg/dL — ABNORMAL HIGH (ref 0–149)
VLDL Cholesterol Cal: 36 mg/dL (ref 5–40)

## 2015-07-09 LAB — TSH: TSH: 1.87 u[IU]/mL (ref 0.450–4.500)

## 2015-07-15 ENCOUNTER — Telehealth: Payer: Self-pay | Admitting: Emergency Medicine

## 2015-07-15 ENCOUNTER — Encounter: Payer: BC Managed Care – PPO | Attending: Family Medicine | Admitting: *Deleted

## 2015-07-15 ENCOUNTER — Encounter: Payer: Self-pay | Admitting: *Deleted

## 2015-07-15 VITALS — BP 120/70 | Ht 73.0 in | Wt 253.5 lb

## 2015-07-15 DIAGNOSIS — E119 Type 2 diabetes mellitus without complications: Secondary | ICD-10-CM | POA: Insufficient documentation

## 2015-07-15 MED ORDER — ANDROGEL PUMP 20.25 MG/ACT (1.62%) TD GEL: 1.0000 | TRANSDERMAL | Status: DC

## 2015-07-15 NOTE — Telephone Encounter (Signed)
Called into pharmacy

## 2015-07-15 NOTE — Patient Instructions (Addendum)
Check blood sugars 2 x day before breakfast and 2 hrs after supper every day Exercise: Begin walking  for  20   minutes   3  days a week and gradually increase to 30 minutes 5 x week Eat 3 meals day,  2-3  snacks a day Space meals 4-6 hours apart Limit fried foods and desserts/sweets Bring blood sugar records to the next class Return for classes on:  Mondays Jul 25, 2015   5:30 - 8:30 pm           Aug 08, 2015   5:30 - 8:30 pm           Aug 15, 2015  5:30 - 8:30 pm

## 2015-07-15 NOTE — Progress Notes (Signed)
Diabetes Self-Management Education  Visit Type: First/Initial  Appt. Start Time: 1110 Appt. End Time: 1230  07/15/2015  [name], identified by name and date of birth, is a 49 y.o. male with a diagnosis of Diabetes: Type 2.   ASSESSMENT  Blood pressure 120/70, height  (1.854 m), weight 253 lb 8 oz (114.987 kg). Body mass index is 33.45 kg/(m^2).      Diabetes Self-Management Education - 07/15/15 1529    Visit Information   Visit Type First/Initial   Initial Visit   Diabetes Type Type 2   Are you currently following a meal plan? No   Are you taking your medications as prescribed? Yes   Date Diagnosed 15 years ago   Health Coping   How would you rate your overall health? Good   Psychosocial Assessment   Patient Belief/Attitude about Diabetes Motivated to manage diabetes   Self-care barriers None   Self-management support Doctor's office;Family   Patient Concerns Nutrition/Meal planning;Medication;Monitoring;Healthy Lifestyle;Problem Solving;Glycemic Control;Weight Control   Special Needs None   Preferred Learning Style Auditory;Visual;Hands on   Learning Readiness Ready   How often do you need to have someone help you when you read instructions, pamphlets, or other written materials from your doctor or pharmacy? 1 - Never   What is the last grade level you completed in school? college   Complications   Last HgB A1C per patient/outside source 7.4 %  07/04/15   How often do you check your blood sugar? 1-2 times/day   Fasting Blood glucose range (mg/dL) 14-782;956-213  pt reports FBG's 120-180's mg/dL   Postprandial Blood glucose range (mg/dL) 08-657  pt reports pp 120's mg/dL   Have you had a dilated eye exam in the past 12 months? Yes   Have you had a dental exam in the past 12 months? Yes   Are you checking your feet? Yes   How many days per week are you checking your feet? 7   Dietary Intake   Breakfast canadian bacon, 3 eggs with cheese   Snack (morning)  peanut butter crackers; popcorn; peanut butter cookies   Lunch fast food   Snack (afternoon) peanut butter crackers   Dinner meat and vegetables   Snack (evening) popcorn, ice cream   Beverage(s) water, unsweetened coffee and tea   Exercise   Exercise Type ADL's   Patient Education   Previous Diabetes Education Yes (please comment)  Pt reports 1 class when first diagnosed   Disease state  Definition of diabetes, type 1 and 2, and the diagnosis of diabetes;Explored patient's options for treatment of their diabetes   Nutrition management  Role of diet in the treatment of diabetes and the relationship between the three main macronutrients and blood glucose level;Food label reading, portion sizes and measuring food.   Physical activity and exercise  Role of exercise on diabetes management, blood pressure control and cardiac health.   Medications Reviewed patients medication for diabetes, action, purpose, timing of dose and side effects.   Monitoring Purpose and frequency of SMBG.;Identified appropriate SMBG and/or A1C goals.   Chronic complications Relationship between chronic complications and blood glucose control   Psychosocial adjustment Identified and addressed patients feelings and concerns about diabetes   Individualized Goals (developed by patient)   Reducing Risk Improve blood sugars Decrease medications Prevent diabetes complications Lose weight Lead a healthier lifestyle Become more fit   Outcomes   Expected Outcomes Demonstrated interest in learning. Expect positive outcomes      Individualized  Plan for Diabetes Self-Management Training:   Learning Objective:  Patient will have a greater understanding of diabetes self-management. Patient education plan is to attend individual and/or group sessions per assessed needs and concerns.   Plan:   Patient Instructions  Check blood sugars 2 x day before breakfast and 2 hrs after supper every day Exercise: Begin walking  for  20    minutes   3  days a week and gradually increase to 30 minutes 5 x week Eat 3 meals day,  2-3  snacks a day Space meals 4-6 hours apart Limit fried foods and desserts/sweets Bring blood sugar records to the next class Return for classes on:  Mondays Jul 25, 2015   5:30 - 8:30 pm           Aug 08, 2015   5:30 - 8:30 pm           Aug 15, 2015  5:30 - 8:30 pm  Expected Outcomes:  Demonstrated interest in learning. Expect positive outcomes  Education material provided:  General Meal Planning Guidelines  If problems or questions, patient to contact team via:   Sharion SettlerSheila Shotwell, RN, CCM, CDE [phone]  Future DSME appointment:  July 25, 2015 for Class 1

## 2015-07-25 ENCOUNTER — Encounter: Payer: BC Managed Care – PPO | Admitting: Dietician

## 2015-07-25 VITALS — Ht 72.0 in | Wt 255.2 lb

## 2015-07-25 DIAGNOSIS — E119 Type 2 diabetes mellitus without complications: Secondary | ICD-10-CM | POA: Diagnosis not present

## 2015-07-25 NOTE — Progress Notes (Signed)

## 2015-08-04 ENCOUNTER — Other Ambulatory Visit: Payer: Self-pay | Admitting: Family Medicine

## 2015-08-08 ENCOUNTER — Encounter: Payer: BC Managed Care – PPO | Attending: Family Medicine | Admitting: Dietician

## 2015-08-08 VITALS — Wt 256.8 lb

## 2015-08-08 DIAGNOSIS — E119 Type 2 diabetes mellitus without complications: Secondary | ICD-10-CM | POA: Diagnosis not present

## 2015-08-08 NOTE — Progress Notes (Signed)
Appt. Start Time: 1730 Appt. End Time: 2030  Class 2 Diabetes Overview - define DM; state own type of DM; identify functions of pancreas and insulin; define insulin deficiency vs insulin resistance  Psychosocial - identify DM as a source of stress; state the effects of stress on BG control; verbalize appropriate stress management techniques; identify personal stress issues   Nutritional Management - describe effects of food on blood glucose; identify sources of carbohydrate, protein and fat; verbalize the importance of balance meals in controlling blood glucose; identify meals as well balanced or not; estimate servings of carbohydrate from menus; use food labels to identify servings size, content of carbohydrate, fiber, protein, fat, saturated fat and sodium; recognize food sources of fat, saturated fat, trans fat, sodium and verbalize goals for intake; describe healthful appropriate food choices when dining out -encouraged to eat 3 meals/day and no more than 3 snacks  Exercise - describe the effects of exercise on blood glucose and importance of regular exercise in controlling diabetes; state a plan for personal exercise; verbalize contraindications for exercise  Medications - state name, dose, timing of currently prescribed medications; describe types of medications available for diabetes  Self-Monitoring - state importance of HBGM and demo procedure accurately; use HBGM results to effectively manage diabetes; identify importance of regular HbA1C testing and goals for results  Acute Complications/Sick Day Guidelines - recognize hyperglycemia and hypoglycemia with causes and effects; identify blood glucose results as high, low or in control; list steps in treating and preventing high and low blood glucose; state appropriate measure to manage blood glucose when ill (need for meds, HBGM plan, when to call physician, need for fluids)  Chronic Complications/Foot, Skin, Eye Dental Care - identify  possible long-term complications of diabetes (retinopathy, neuropathy, nephropathy, cardiovascular disease, infections); explain steps in prevention and treatment of chronic complications; state importance of daily self-foot exams; describe how to examine feet and what to look for; explain appropriate eye and dental care  Lifestyle Changes/Goals & Health/Community Resources - state benefits of making appropriate lifestyle changes; identify habits that need to change (meals, tobacco, alcohol); identify strategies to reduce risk factors for personal health; set goals for proper diabetes care; state need for and frequency of healthcare follow-up; describe appropriate community resources for good health (ADA, web sites, apps)   Pregnancy/Sexual Health - define gestational diabetes; state importance of good blood glucose control and birth control prior to pregnancy; state importance of good blood glucose control in preventing sexual problems (impotence, vaginal dryness, infections, loss of desire); state relationship of blood glucose control and pregnancy outcome; describe risk of maternal and fetal complications  Teaching Materials Used: Class 2 Slide Packet A1C Pamphlet Foot Care Literature Menu Ideas Goals for Class 2

## 2015-08-15 ENCOUNTER — Encounter: Payer: BC Managed Care – PPO | Admitting: Dietician

## 2015-08-15 VITALS — BP 120/78 | Ht 72.0 in | Wt 257.4 lb

## 2015-08-15 DIAGNOSIS — E119 Type 2 diabetes mellitus without complications: Secondary | ICD-10-CM

## 2015-08-15 NOTE — Progress Notes (Signed)

## 2015-08-18 ENCOUNTER — Encounter: Payer: Self-pay | Admitting: *Deleted

## 2015-09-05 ENCOUNTER — Other Ambulatory Visit: Payer: Self-pay | Admitting: Family Medicine

## 2015-09-29 ENCOUNTER — Ambulatory Visit (INDEPENDENT_AMBULATORY_CARE_PROVIDER_SITE_OTHER): Payer: BC Managed Care – PPO | Admitting: Podiatry

## 2015-09-29 ENCOUNTER — Encounter: Payer: Self-pay | Admitting: Podiatry

## 2015-09-29 VITALS — BP 104/65 | HR 80 | Resp 18

## 2015-09-29 DIAGNOSIS — M21969 Unspecified acquired deformity of unspecified lower leg: Secondary | ICD-10-CM

## 2015-09-29 DIAGNOSIS — M722 Plantar fascial fibromatosis: Secondary | ICD-10-CM | POA: Diagnosis not present

## 2015-09-29 DIAGNOSIS — E119 Type 2 diabetes mellitus without complications: Secondary | ICD-10-CM | POA: Diagnosis not present

## 2015-09-29 NOTE — Patient Instructions (Signed)

## 2015-09-30 ENCOUNTER — Encounter: Payer: Self-pay | Admitting: Podiatry

## 2015-09-30 DIAGNOSIS — M722 Plantar fascial fibromatosis: Secondary | ICD-10-CM | POA: Insufficient documentation

## 2015-09-30 HISTORY — DX: Plantar fascial fibromatosis: M72.2

## 2015-09-30 NOTE — Progress Notes (Signed)
Subjective:     Patient ID: Ryan Ritter, male   DOB: Jul 14, 1966, 49 y.o.   MRN: [id number]  HPI  49 year old male presents the office today for diabetic risk assessment and her foot exam as well as requesting new orthotics. He states he wears his orthotics in his work boots. Over the last several months she has would have some intermittent discomfort to the arch of the foot mostly on the left side on the bottom. No swelling or redness. No recent injury or trauma. He can perform daily activities without any pain of her after he stands for prolonged periods of time he does get some pain to the area. He was pretty treated for plantar fasciitis that shockwave. He denies any numbness or tingling to his feet denies any claudication symptoms. No history of ulceration. No other complaints.  Review of Systems  All other systems reviewed and are negative.      Objective:   Physical Exam General: AAO x3, NAD  Dermatological: Skin is warm, dry and supple bilateral. Nails x 10 are well manicured; remaining integument appears unremarkable at this time. There are no open sores, no preulcerative lesions, no rash or signs of infection present.  Vascular: Dorsalis Pedis artery and Posterior Tibial artery pedal pulses are 2/4 bilateral with immedate capillary fill time. Pedal hair growth present. No varicosities and no lower extremity edema present bilateral. There is no pain with calf compression, swelling, warmth, erythema.   Neruologic: Grossly intact via light touch bilateral. Vibratory intact via tuning fork bilateral. Protective threshold with Semmes Wienstein monofilament intact to all pedal sites bilateral. Patellar and Achilles deep tendon reflexes 2+ bilateral. No Babinski or clonus noted bilateral.   Musculoskeletal:  There is a mild decrease in medial arch height more on the right side and the left. There is no specific area of pinpoint bony tenderness or pain the vibratory sensation of bilateral lower  extremity is. Subjectively there is some tenderness on the medial band of the plantar fascia within the arch the foot however he is having no pain at this time but this is where he does get some discomfort. There is no pain on the heel. Range of motion intact. No pain, crepitus, or limitation noted with foot and ankle range of motion bilateral. Muscular strength 5/5 in all groups tested bilateral.  Gait: Unassisted, Nonantalgic.      Assessment:      49 year old male presents for diabetic risk assessment/foot exam requesting new orthotics for his work boots    Plan:     -Treatment options discussed including all alternatives, risks, and complications -Etiology of symptoms were discussed - Currently no open sores to his feet and his feet are overall doing well. Continue daily foot inspection. - He was scanned for orthotics to present to [facility]. Given his history of plantar fasciitis was intermittent arch pain which appears to be on the plantar fascia discussed construction exercises icing in shoe gear modifications. He only wears the orthotics in his work boots and I recommended him to wear them in his everyday shoes as well. At the symptoms worsen or continue to follow-up however he is having no pain at this times oh were outlined steroid injections or any further invasive treatment. -Follow-up in 3 weeks to [facility] or sooner if any problems arise. In the meantime, encouraged to call the office with any questions, concerns, change in symptoms.   Ovid CurdMatthew Wagoner, DPM

## 2015-10-20 ENCOUNTER — Encounter: Payer: BC Managed Care – PPO | Admitting: Podiatry

## 2015-11-07 ENCOUNTER — Ambulatory Visit: Payer: BC Managed Care – PPO | Admitting: Family Medicine

## 2015-11-08 ENCOUNTER — Other Ambulatory Visit: Payer: Self-pay

## 2015-11-10 ENCOUNTER — Encounter: Payer: Self-pay | Admitting: Family Medicine

## 2015-11-14 ENCOUNTER — Telehealth: Payer: Self-pay

## 2015-11-14 NOTE — Telephone Encounter (Signed)
Pt called and I see a message had been sent to you,told him that you would call him tomorrow.

## 2015-11-15 MED ORDER — DAPAGLIFLOZIN PROPANEDIOL 10 MG PO TABS: 10.0000 mg | ORAL_TABLET | Freq: Every day | ORAL | Status: DC

## 2015-11-15 MED ORDER — SITAGLIPTIN PHOS-METFORMIN HCL 50-1000 MG PO TABS: 1.0000 | ORAL_TABLET | Freq: Two times a day (BID) | ORAL | Status: DC

## 2015-11-15 NOTE — Telephone Encounter (Signed)
Spoke to patient by phone regarding medication. Per Dr.Morrisey may change Kombiglyze to Janumet 50/1000 twice a day. Invokana changed to Farxiga 10 mg qd. 1 month supply sent to [facility] to make sure patient can tolerate. Will give refills in 1 month

## 2015-11-30 ENCOUNTER — Ambulatory Visit: Payer: BC Managed Care – PPO | Admitting: Family Medicine

## 2015-12-05 ENCOUNTER — Other Ambulatory Visit: Payer: Self-pay | Admitting: Family Medicine

## 2015-12-07 ENCOUNTER — Ambulatory Visit (INDEPENDENT_AMBULATORY_CARE_PROVIDER_SITE_OTHER): Payer: BC Managed Care – PPO | Admitting: Podiatry

## 2015-12-07 ENCOUNTER — Ambulatory Visit (INDEPENDENT_AMBULATORY_CARE_PROVIDER_SITE_OTHER): Payer: BC Managed Care – PPO

## 2015-12-07 ENCOUNTER — Encounter: Payer: Self-pay | Admitting: Podiatry

## 2015-12-07 VITALS — BP 127/62 | HR 75 | Resp 16

## 2015-12-07 DIAGNOSIS — M722 Plantar fascial fibromatosis: Secondary | ICD-10-CM

## 2015-12-07 DIAGNOSIS — M7752 Other enthesopathy of left foot: Secondary | ICD-10-CM

## 2015-12-07 MED ORDER — MELOXICAM 15 MG PO TABS: 15.0000 mg | ORAL_TABLET | Freq: Every day | ORAL | Status: DC

## 2015-12-07 NOTE — Progress Notes (Signed)
He presents today with a history of diabetes. He states that his left ankle has been bothering him for quite some time from an old injury a long time ago. He states he recently has become more swollen and more painful on the anterolateral aspect as he points to the area. Also relates heel pain. He denies any trauma.  Objective: Vital signs stable alert and oriented 3. Pulses are palpable. Neurologic sensorium is intact. He has full range of motion of the ankle joint and of the subtalar joint pain but he does have pain on palpation of the medial calcaneal tubercle of the left heel. Radiographs of the ankle and heel today do demonstrate for appears to be a spur to the anterolateral aspect of the ankle though it is hard to tell and a 2-dimensional view. It also demonstrates what appears to be a periostitis on the talus that corresponds with the exostosis from the tibia. Soft tissue increase in density at the plantar fascial calcaneal insertion site is indicative of plantar fasciitis.  Assessment: Plantar fasciitis left. Capsulitis of the anterolateral ankle left.  Plan: I started him on meloxicam 15 mg 1 by mouth daily. I injected the ankle joint after sterile Betadine skin prep with Kenalog and local anesthetic as I did the plantar fascia. Also put him in a plantar fascial brace and will follow up with him in 1 month. We discussed appropriate shoe gear stretching exercises ice therapy modification.

## 2015-12-15 ENCOUNTER — Other Ambulatory Visit: Payer: Self-pay | Admitting: Family Medicine

## 2016-01-05 ENCOUNTER — Other Ambulatory Visit: Payer: Self-pay | Admitting: Family Medicine

## 2016-01-13 ENCOUNTER — Ambulatory Visit: Payer: BC Managed Care – PPO | Admitting: Family Medicine

## 2016-01-18 ENCOUNTER — Ambulatory Visit (INDEPENDENT_AMBULATORY_CARE_PROVIDER_SITE_OTHER): Payer: BC Managed Care – PPO | Admitting: Podiatry

## 2016-01-18 ENCOUNTER — Encounter: Payer: Self-pay | Admitting: Podiatry

## 2016-01-18 DIAGNOSIS — M722 Plantar fascial fibromatosis: Secondary | ICD-10-CM

## 2016-01-18 DIAGNOSIS — M7752 Other enthesopathy of left foot: Secondary | ICD-10-CM

## 2016-01-18 NOTE — Progress Notes (Signed)
He presents today for follow-up of his plantar fasciitis left foot and his painful ankle left foot. He states that he is approximately 40-50% improved. The heel is less improved but is nowhere near as painful as it was prior to his last injection. He states that he has refilled his meloxicam and continues to take it.  Objective: Vital signs are stable alert and oriented 3. Pulses are strongly palpable. Neurologic sensorium is intact. Pain on palpation medial continued tubercle of the left heel. No reproducible pain on the ankle joint.  Assessment: Pain limb secondary to plantar fasciitis left foot. Ankle joint capsulitis secondary to trauma in the past.  Plan: Injected the left heel once again today. Should this not resolve his symptoms we will consider MRI of the ankle and heel next visit. Need to also consider orthotics. They are covered by his insurance

## 2016-02-01 ENCOUNTER — Other Ambulatory Visit: Payer: Self-pay | Admitting: Family Medicine

## 2016-02-20 ENCOUNTER — Ambulatory Visit: Payer: BC Managed Care – PPO | Admitting: Podiatry

## 2016-03-03 ENCOUNTER — Other Ambulatory Visit: Payer: Self-pay | Admitting: Family Medicine

## 2016-04-02 ENCOUNTER — Telehealth: Payer: Self-pay | Admitting: Family Medicine

## 2016-04-02 ENCOUNTER — Other Ambulatory Visit: Payer: Self-pay | Admitting: Family Medicine

## 2016-04-02 NOTE — Telephone Encounter (Signed)
Dr Thana AtesMorrisey patient has scheduled appointment with you for 04-20-16. He is needing refills on Farxiga 10mg , losartan 100mg , and rosuvastatin 20mg . Please send enough to last until appointment to [facility].

## 2016-04-03 MED ORDER — ROSUVASTATIN CALCIUM 20 MG PO TABS: 20.0000 mg | ORAL_TABLET | Freq: Every day | ORAL | Status: DC

## 2016-04-03 MED ORDER — LOSARTAN POTASSIUM 100 MG PO TABS: 100.0000 mg | ORAL_TABLET | Freq: Every day | ORAL | Status: DC

## 2016-04-03 MED ORDER — DAPAGLIFLOZIN PROPANEDIOL 10 MG PO TABS: 10.0000 mg | ORAL_TABLET | Freq: Every day | ORAL | Status: DC

## 2016-04-03 NOTE — Telephone Encounter (Signed)
SGPT and Cr from October 2016 reviewed; Rxs approved Patient has two different combination pills containing metformin in his med list He also has sitagliptin and saxagliptin in his med list (two DPP-4 inhibitors) He also has a GLP-1 agonist (Victoza) in his list and it is not FDA approved to give this with a DPP-4 inhibitor -------------------- I talked with patient; he says he is not taking Kombiglyze anymore That takes care of the duplication of metformin and DPP-4 inhibitors I advised him to stop the Victoza; risk of pancreatitis with not much lowering of A1c if given with DPP-4 inhibitor I look forward to seeing him soon

## 2016-04-20 ENCOUNTER — Ambulatory Visit (INDEPENDENT_AMBULATORY_CARE_PROVIDER_SITE_OTHER): Payer: BC Managed Care – PPO | Admitting: Family Medicine

## 2016-04-20 ENCOUNTER — Encounter: Payer: Self-pay | Admitting: Family Medicine

## 2016-04-20 VITALS — BP 116/70 | HR 77 | Temp 98.4°F | Resp 14 | Wt 263.0 lb

## 2016-04-20 DIAGNOSIS — E669 Obesity, unspecified: Secondary | ICD-10-CM | POA: Diagnosis not present

## 2016-04-20 DIAGNOSIS — I10 Essential (primary) hypertension: Secondary | ICD-10-CM

## 2016-04-20 DIAGNOSIS — E291 Testicular hypofunction: Secondary | ICD-10-CM

## 2016-04-20 DIAGNOSIS — E119 Type 2 diabetes mellitus without complications: Secondary | ICD-10-CM

## 2016-04-20 DIAGNOSIS — E782 Mixed hyperlipidemia: Secondary | ICD-10-CM | POA: Diagnosis not present

## 2016-04-20 DIAGNOSIS — Z5181 Encounter for therapeutic drug level monitoring: Secondary | ICD-10-CM

## 2016-04-20 DIAGNOSIS — E349 Endocrine disorder, unspecified: Secondary | ICD-10-CM

## 2016-04-20 MED ORDER — LOSARTAN POTASSIUM 50 MG PO TABS: 50.0000 mg | ORAL_TABLET | Freq: Every day | ORAL | Status: DC

## 2016-04-20 NOTE — Patient Instructions (Addendum)
Read about Clomid for stimulation of production of testosterone If you would like a referral to a urologist, just let me know  Return next week for fasting labs  Please do see your eye doctor regularly, and have your eyes examined every year (or more often per his or her recommendation) Check your feet every night and let me know right away of any sores, infections, numbness, etc. Try to limit sweets, white bread, white rice, white potatoes It is okay with me for you to not check your fingerstick blood sugars (per [employer] Best Practices), unless you are interested and feel it would be helpful for you  Try to limit saturated fats in your diet (bologna, hot dogs, barbeque, cheeseburgers, hamburgers, steak, bacon, sausage, cheese, etc.) and get more fresh fruits, vegetables, and whole grains  Check out the information at familydoctor.org entitled "Nutrition for Weight Loss: What You Need to Know about Fad Diets" Try to lose between 1-2 pounds per week by taking in fewer calories and burning off more calories You can succeed by limiting portions, limiting foods dense in calories and fat, becoming more active, and drinking 8 glasses of water a day (64 ounces) Don't skip meals, especially breakfast, as skipping meals may alter your metabolism Do not use over-the-counter weight loss pills or gimmicks that claim rapid weight loss A healthy BMI (or body mass index) is between 18.5 and 24.9 You can calculate your ideal BMI at the NIH website [employer].huhttp://www.nhlbi.nih.gov/health/educational/lose_wt/BMI/bmicalc.htm

## 2016-04-20 NOTE — Progress Notes (Signed)
BP 116/70   Pulse 77   Temp 98.4 F (36.9 C) (Oral)   Resp 14   Wt 263 lb (119.3 kg)   SpO2 97%   BMI 35.67 kg/m     Subjective:    Patient ID: Ryan Ritter, male    DOB: 1966/09/27, 50 y.o.   MRN: [id number]  HPI: Ryan Ritter is a 50 y.o. male  Chief Complaint  Patient presents with  . Medication Refill   Patient is new to me; his usual provider is out of the office  Type 2 diabetes mellitus; runs in the family; dx 10+ years; no complications due to diabetes to his knowledge; on four oral agents; does not check sugars at home, just checking A1c; no blurred vision; no low sugars; no numbness in the feet; has gained about 5 pounds  High cholesterol; on crestor and vascepa; not fasting for labs and will return for that; he does not eat as much regular bacon, canadian bacon instead, tried to make little changes  He is on lorsartan on kidney protection; not checking BP away from the doctor; he does not recall any allergy to benicar; he is tolerating losartan just fine; he says that he does not really have high blood pressure  Low testosterone; was seeing Dr. Thana AtesMorrisey and was on androgel and it was not helping very much; then the first of this year, [employer] quit covering it  Obesity; got up 299; decided to not hit 300  Depression screen [facility] 04/20/2016 07/15/2015 07/04/2015 04/08/2015  Decreased Interest 0 0 0 0  Down, Depressed, Hopeless 0 0 0 0  PHQ - 2 Score 0 0 0 0   Relevant past medical, surgical, family and social history reviewed Past Medical History:  Diagnosis Date  . Allergy   . Diabetes mellitus without complication (HCC)   . Hyperlipidemia   . Hypertension   . Obesity 03/14/2015  . Sleep apnea    Past Surgical History:  Procedure Laterality Date  . tendinitis Left   . vascectomy  08/2014   Family History  Problem Relation Age of Onset  . Breast cancer Mother   . Colon polyps Mother   . Diabetes Father   . Pancreatic cancer Father   . Diabetes  Brother   . Diabetes Maternal Grandmother    Social History  Substance Use Topics  . Smoking status: Never Smoker  . Smokeless tobacco: Former NeurosurgeonUser    Types: Chew    Quit date: 10/01/1988  . Alcohol use No   Interim medical history since last visit reviewed. Allergies and medications reviewed  Review of Systems Per HPI unless specifically indicated above     Objective:    BP 116/70   Pulse 77   Temp 98.4 F (36.9 C) (Oral)   Resp 14   Wt 263 lb (119.3 kg)   SpO2 97%   BMI 35.67 kg/m    Wt Readings from Last 3 Encounters:  04/20/16 263 lb (119.3 kg)  08/15/15 257 lb 6.4 oz (116.8 kg)  08/08/15 256 lb 12.8 oz (116.5 kg)    Physical Exam  Constitutional: He appears well-developed and well-nourished. No distress.  HENT:  Head: Normocephalic and atraumatic.  Eyes: EOM are normal. No scleral icterus.  Neck: No thyromegaly present.  Cardiovascular: Normal rate and regular rhythm.   Pulmonary/Chest: Effort normal and breath sounds normal.  Abdominal: Soft. Bowel sounds are normal. He exhibits no distension.  Musculoskeletal: He exhibits no edema.  Neurological: He  is alert.  Skin: Skin is warm and dry. No pallor.  Psychiatric: He has a normal mood and affect. His behavior is normal. Judgment and thought content normal.   Diabetic Foot Form - Detailed   Diabetic Foot Exam - detailed Diabetic Foot exam was performed with the following findings:  Yes 04/20/2016  8:02 AM  Visual Foot Exam completed.:  Yes  Are the toenails ingrown?:  No Normal Range of Motion:  Yes Pulse Foot Exam completed.:  Yes  Right Dorsalis Pedis:  Present Left Dorsalis Pedis:  Present  Sensory Foot Exam Completed.:  Yes Swelling:  No Semmes-Weinstein Monofilament Test R Site 1-Great Toe:  Pos L Site 1-Great Toe:  Pos  R Site 4:  Pos L Site 4:  Pos  R Site 5:  Pos L Site 5:  Pos       Results for orders placed or performed in visit on 07/04/15  Comprehensive Metabolic Panel (CMET)  Result  Value Ref Range   Glucose 208 (H) 65 - 99 mg/dL   BUN 16 6 - 24 mg/dL   Creatinine, Ser 4.09 0.76 - 1.27 mg/dL   GFR calc non Af Amer 101 >59 mL/min/1.73   GFR calc Af Amer 117 >59 mL/min/1.73   BUN/Creatinine Ratio 18 9 - 20   Sodium 137 134 - 144 mmol/L   Potassium 4.3 3.5 - 5.2 mmol/L   Chloride 98 97 - 108 mmol/L   CO2 25 18 - 29 mmol/L   Calcium 9.4 8.7 - 10.2 mg/dL   Total Protein 7.0 6.0 - 8.5 g/dL   Albumin 4.6 3.5 - 5.5 g/dL   Globulin, Total 2.4 1.5 - 4.5 g/dL   Albumin/Globulin Ratio 1.9 1.1 - 2.5   Bilirubin Total 1.2 0.0 - 1.2 mg/dL   Alkaline Phosphatase 35 (L) 39 - 117 IU/L   AST 23 0 - 40 IU/L   ALT 26 0 - 44 IU/L  Lipid Profile  Result Value Ref Range   Cholesterol, Total 109 100 - 199 mg/dL   Triglycerides 811 (H) 0 - 149 mg/dL   HDL 27 (L) >91 mg/dL   VLDL Cholesterol Cal 36 5 - 40 mg/dL   LDL Calculated 46 0 - 99 mg/dL   Chol/HDL Ratio 4.0 0.0 - 5.0 ratio units  PSA  Result Value Ref Range   Prostate Specific Ag, Serum 0.5 0.0 - 4.0 ng/mL  Testosterone  Result Value Ref Range   Testosterone 126 (L) 348 - 1,197 ng/dL   Comment, Testosterone Comment   TSH  Result Value Ref Range   TSH 1.870 0.450 - 4.500 uIU/mL  POCT Glucose (CBG)  Result Value Ref Range   POC Glucose 175 (A) 70 - 99 mg/dl  POCT HgB Y7W  Result Value Ref Range   Hemoglobin A1C 7.4   POCT UA - Microalbumin  Result Value Ref Range   Microalbumin Ur, POC 20 mg/L   Creatinine, POC  mg/dL   Albumin/Creatinine Ratio, Urine, POC        Assessment & Plan:   Problem List Items Addressed This Visit      Cardiovascular and Mediastinum   Benign essential HTN    Limit salt; try DASH guidelines; weight loss encouraged; continue med      Relevant Medications   losartan (COZAAR) 50 MG tablet     Endocrine   Diabetes mellitus, type 2 (HCC) - Primary    Check A1c and microalbumin:Cr; foot exam by MD today; weight loss encouraged; healthy eating;  no need to check FSBS per ACE Best  Practices at this time      Relevant Medications   losartan (COZAAR) 50 MG tablet   Other Relevant Orders   Hemoglobin A1c   Lipid panel   Microalbumin / creatinine urine ratio     Other   Obesity    Encouraged weight loss; see AVS      Medication monitoring encounter   Relevant Orders   Comprehensive Metabolic Panel (CMET)   Hypotestosteronism    Suggested he see urologist to consider clomid      Combined fat and carbohydrate induced hyperlipemia    Limit saturated fats; monitor lipids      Relevant Medications   losartan (COZAAR) 50 MG tablet    Other Visit Diagnoses   None.     Follow up plan: Return in about 3 months (around 07/30/2016) for fasting lipids and visit.  An after-visit summary was printed and given to the patient at check-out.  Please see the patient instructions which may contain other information and recommendations beyond what is mentioned above in the assessment and plan.  Meds ordered this encounter  Medications  . doxycycline (VIBRAMYCIN) 100 MG capsule  . losartan (COZAAR) 50 MG tablet    Sig: Take 1 tablet (50 mg total) by mouth daily.    Dispense:  30 tablet    Refill:  5    Lowering his dose from 100 mg to 50 mg daily    Orders Placed This Encounter  Procedures  . Hemoglobin A1c  . Comprehensive Metabolic Panel (CMET)  . Lipid panel  . Microalbumin / creatinine urine ratio

## 2016-04-20 NOTE — Assessment & Plan Note (Addendum)
Check A1c and microalbumin:Cr; foot exam by MD today; weight loss encouraged; healthy eating; no need to check FSBS per [facility] Best Practices at this time

## 2016-04-23 ENCOUNTER — Encounter: Payer: Self-pay | Admitting: Family Medicine

## 2016-04-23 NOTE — Assessment & Plan Note (Signed)
Encouraged weight loss; see AVS 

## 2016-04-23 NOTE — Assessment & Plan Note (Signed)
Limit saturated fats; monitor lipids

## 2016-04-23 NOTE — Assessment & Plan Note (Signed)
Limit salt; try DASH guidelines; weight loss encouraged; continue med

## 2016-04-23 NOTE — Assessment & Plan Note (Signed)
Suggested he see urologist to consider clomid

## 2016-04-27 LAB — COMPREHENSIVE METABOLIC PANEL
ALT: 24 IU/L (ref 0–44)
AST: 18 IU/L (ref 0–40)
Albumin/Globulin Ratio: 1.9 (ref 1.2–2.2)
Albumin: 4.2 g/dL (ref 3.5–5.5)
Alkaline Phosphatase: 33 IU/L — ABNORMAL LOW (ref 39–117)
BUN/Creatinine Ratio: 18 (ref 9–20)
BUN: 16 mg/dL (ref 6–24)
Bilirubin Total: 0.7 mg/dL (ref 0.0–1.2)
CO2: 22 mmol/L (ref 18–29)
Calcium: 9.1 mg/dL (ref 8.7–10.2)
Chloride: 96 mmol/L (ref 96–106)
Creatinine, Ser: 0.91 mg/dL (ref 0.76–1.27)
GFR calc Af Amer: 113 mL/min/{1.73_m2} (ref >59)
GFR calc non Af Amer: 98 mL/min/{1.73_m2} (ref >59)
Globulin, Total: 2.2 g/dL (ref 1.5–4.5)
Glucose: 274 mg/dL — ABNORMAL HIGH (ref 65–99)
Potassium: 4.4 mmol/L (ref 3.5–5.2)
Sodium: 135 mmol/L (ref 134–144)
Total Protein: 6.4 g/dL (ref 6.0–8.5)

## 2016-04-27 LAB — HEMOGLOBIN A1C
Est. average glucose Bld gHb Est-mCnc: 186 mg/dL
Hgb A1c MFr Bld: 8.1 % — ABNORMAL HIGH (ref 4.8–5.6)

## 2016-04-27 LAB — LIPID PANEL
Chol/HDL Ratio: 3.9 ratio units (ref 0.0–5.0)
Cholesterol, Total: 93 mg/dL — ABNORMAL LOW (ref 100–199)
HDL: 24 mg/dL — ABNORMAL LOW (ref >39)
LDL Calculated: 29 mg/dL (ref 0–99)
Triglycerides: 202 mg/dL — ABNORMAL HIGH (ref 0–149)
VLDL Cholesterol Cal: 40 mg/dL (ref 5–40)

## 2016-04-27 LAB — MICROALBUMIN / CREATININE URINE RATIO
Creatinine, Urine: 77.3 mg/dL
MICROALB/CREAT RATIO: 36.6 mg/g creat — ABNORMAL HIGH (ref 0.0–30.0)
Microalbumin, Urine: 28.3 ug/mL

## 2016-04-29 ENCOUNTER — Other Ambulatory Visit: Payer: Self-pay | Admitting: Family Medicine

## 2016-04-29 MED ORDER — ROSUVASTATIN CALCIUM 20 MG PO TABS: 10.0000 mg | ORAL_TABLET | Freq: Every day | ORAL | 2 refills | Status: DC

## 2016-05-01 ENCOUNTER — Other Ambulatory Visit: Payer: Self-pay | Admitting: Family Medicine

## 2016-05-01 ENCOUNTER — Telehealth: Payer: Self-pay | Admitting: Family Medicine

## 2016-05-01 MED ORDER — GLIMEPIRIDE 4 MG PO TABS: 4.0000 mg | ORAL_TABLET | Freq: Every day | ORAL | 5 refills | Status: DC

## 2016-05-11 ENCOUNTER — Telehealth: Payer: Self-pay | Admitting: Family Medicine

## 2016-05-11 MED ORDER — SITAGLIPTIN PHOS-METFORMIN HCL 50-1000 MG PO TABS: 1.0000 | ORAL_TABLET | Freq: Two times a day (BID) | ORAL | 1 refills | Status: DC

## 2016-05-11 NOTE — Telephone Encounter (Signed)
rx sent, creatinine normal

## 2016-05-15 ENCOUNTER — Other Ambulatory Visit: Payer: Self-pay | Admitting: Family Medicine

## 2016-05-15 NOTE — Telephone Encounter (Signed)
COMPLETED

## 2016-05-23 ENCOUNTER — Encounter: Payer: Self-pay | Admitting: Family Medicine

## 2016-05-23 ENCOUNTER — Ambulatory Visit (INDEPENDENT_AMBULATORY_CARE_PROVIDER_SITE_OTHER): Payer: BC Managed Care – PPO | Admitting: Family Medicine

## 2016-05-23 VITALS — BP 130/68 | HR 87 | Temp 99.5°F | Resp 14 | Wt 261.4 lb

## 2016-05-23 DIAGNOSIS — J029 Acute pharyngitis, unspecified: Secondary | ICD-10-CM

## 2016-05-23 DIAGNOSIS — R509 Fever, unspecified: Secondary | ICD-10-CM

## 2016-05-23 LAB — POCT RAPID STREP A (OFFICE): Rapid Strep A Screen: NEGATIVE

## 2016-05-23 LAB — CBC WITH DIFFERENTIAL/PLATELET
Basophils Absolute: 61 cells/uL (ref 0–200)
Basophils Relative: 1 %
Eosinophils Absolute: 305 cells/uL (ref 15–500)
Eosinophils Relative: 5 %
HCT: 45.1 % (ref 38.5–50.0)
Hemoglobin: 15.7 g/dL (ref 13.2–17.1)
Lymphocytes Relative: 23 %
Lymphs Abs: 1403 cells/uL (ref 850–3900)
MCH: 30.3 pg (ref 27.0–33.0)
MCHC: 34.8 g/dL (ref 32.0–36.0)
MCV: 87.1 fL (ref 80.0–100.0)
MPV: 10.7 fL (ref 7.5–12.5)
Monocytes Absolute: 488 cells/uL (ref 200–950)
Monocytes Relative: 8 %
Neutro Abs: 3843 cells/uL (ref 1500–7800)
Neutrophils Relative %: 63 %
Platelets: 145 10*3/uL (ref 140–400)
RBC: 5.18 MIL/uL (ref 4.20–5.80)
RDW: 13.8 % (ref 11.0–15.0)
WBC: 6.1 10*3/uL (ref 3.8–10.8)

## 2016-05-23 LAB — COMPLETE METABOLIC PANEL WITH GFR
ALT: 27 U/L (ref 9–46)
AST: 19 U/L (ref 10–35)
Albumin: 4.3 g/dL (ref 3.6–5.1)
Alkaline Phosphatase: 32 U/L — ABNORMAL LOW (ref 40–115)
BUN: 18 mg/dL (ref 7–25)
CO2: 22 mmol/L (ref 20–31)
Calcium: 9.5 mg/dL (ref 8.6–10.3)
Chloride: 102 mmol/L (ref 98–110)
Creat: 0.91 mg/dL (ref 0.70–1.33)
GFR, Est African American: >89 mL/min (ref >=60)
GFR, Est Non African American: >89 mL/min (ref >=60)
Glucose, Bld: 194 mg/dL — ABNORMAL HIGH (ref 65–99)
Potassium: 4.3 mmol/L (ref 3.5–5.3)
Sodium: 135 mmol/L (ref 135–146)
Total Bilirubin: 0.8 mg/dL (ref 0.2–1.2)
Total Protein: 6.7 g/dL (ref 6.1–8.1)

## 2016-05-23 NOTE — Patient Instructions (Addendum)
We'll get some labs today If you have not heard anything from my staff in a week about any orders/referrals/studies from today, please contact us here to follow-up [phone] Call me if swelling gets bigger  Sore Throat A sore throat is pain, burning, irritation, or scratchiness of the throat. There is often pain or tenderness when swallowing or talking. A sore throat may be accompanied by other symptoms, such as coughing, sneezing, fever, and swollen neck glands. A sore throat is often the first sign of another sickness, such as a cold, flu, strep throat, or mononucleosis (commonly known as mono). Most sore throats go away without medical treatment. CAUSES  The most common causes of a sore throat include:  A viral infection, such as a cold, flu, or mono.  A bacterial infection, such as strep throat, tonsillitis, or whooping cough.  Seasonal allergies.  Dryness in the air.  Irritants, such as smoke or pollution.  Gastroesophageal reflux disease (GERD). HOME CARE INSTRUCTIONS   Only take over-the-counter medicines as directed by your caregiver.  Drink enough fluids to keep your urine clear or pale yellow.  Rest as needed.  Try using throat sprays, lozenges, or sucking on hard candy to ease any pain (if older than 4 years or as directed).  Sip warm liquids, such as broth, herbal tea, or warm water with honey to relieve pain temporarily. You may also eat or drink cold or frozen liquids such as frozen ice pops.  Gargle with salt water (mix 1 tsp salt with 8 oz of water).  Do not smoke and avoid secondhand smoke.  Put a cool-mist humidifier in your bedroom at night to moisten the air. You can also turn on a hot shower and sit in the bathroom with the door closed for 5-10 minutes. SEEK IMMEDIATE MEDICAL CARE IF:  You have difficulty breathing.  You are unable to swallow fluids, soft foods, or your saliva.  You have increased swelling in the throat.  Your sore throat does  not get better in 7 days.  You have nausea and vomiting.  You have a fever or persistent symptoms for more than 2-3 days.  You have a fever and your symptoms suddenly get worse. MAKE SURE YOU:   Understand these instructions.  Will watch your condition.  Will get help right away if you are not doing well or get worse.   This information is not intended to replace advice given to you by your health care provider. Make sure you discuss any questions you have with your health care provider.   Document Released: 10/25/2004 Document Revised: 10/08/2014 Document Reviewed: 05/25/2012 Elsevier Interactive Patient Education [employer]2016 Elsevier Inc.

## 2016-05-23 NOTE — Progress Notes (Signed)
BP 130/68   Pulse 87   Temp 99.5 F (37.5 C) (Oral)   Resp 14   Wt 261 lb 6.4 oz (118.6 kg)   SpO2 97%   BMI 35.45 kg/m    Subjective:    Patient ID: Ryan Ritter, male    DOB: [birth year], 50 y.o.   MRN: [id number]  HPI: Ryan Rawracy L Reagor is a 50 y.o. male  Chief Complaint  Patient presents with  . Sore Throat    2 weeks   Sore throat when swallowing; tender toward the back; he has had a little cough in the mornings; brings up a little bit of phlegm, not regular He has had some swollen lymph nodes in the front of the neck He has not felt well Has not checked temperature; little bit of night sweats, nothing new No rash No travel No ear pain, no siginficant sinus drainage He has felt tired lately No nausea or vomiting He is working on weight loss; grilling chicken now and having salads Taking nexium for reflux  Relevant past medical, surgical, family and social history reviewed Past Medical History:  Diagnosis Date  . Allergy   . Diabetes mellitus without complication (HCC)   . Hyperlipidemia   . Hypertension   . Obesity 03/14/2015  . Sleep apnea    Social History  Substance Use Topics  . Smoking status: Never Smoker  . Smokeless tobacco: Former NeurosurgeonUser    Types: Chew    Quit date: 10/01/1988  . Alcohol use No   Interim medical history since last visit reviewed. Allergies and medications reviewed  Review of Systems Per HPI unless specifically indicated above     Objective:    BP 130/68   Pulse 87   Temp 99.5 F (37.5 C) (Oral)   Resp 14   Wt 261 lb 6.4 oz (118.6 kg)   SpO2 97%   BMI 35.45 kg/m   Wt Readings from Last 3 Encounters:  05/23/16 261 lb 6.4 oz (118.6 kg)  04/20/16 263 lb (119.3 kg)  08/15/15 257 lb 6.4 oz (116.8 kg)    Physical Exam  Constitutional: He appears well-developed and well-nourished.  HENT:  Right Ear: Hearing, tympanic membrane, external ear and ear canal normal. Tympanic membrane is not erythematous.  Left Ear: Hearing,  tympanic membrane, external ear and ear canal normal. Tympanic membrane is not erythematous.  Nose: No rhinorrhea.  Mouth/Throat: Mucous membranes are normal. Posterior oropharyngeal erythema (very mild) present. No oropharyngeal exudate, posterior oropharyngeal edema or tonsillar abscesses.  Eyes: Right eye exhibits no discharge. Left eye exhibits no discharge. No scleral icterus.  Cardiovascular: Normal rate and regular rhythm.   No extrasystoles are present.  Pulmonary/Chest: Effort normal and breath sounds normal.  Abdominal: Soft. Normal appearance. There is no hepatosplenomegaly. There is no tenderness.  Lymphadenopathy:    He has cervical adenopathy (shoddy).  Neurological: He is alert.  Skin: No rash noted. He is not diaphoretic.  Psychiatric: He has a normal mood and affect. His speech is normal and behavior is normal. Judgment and thought content normal. Cognition and memory are normal.      Assessment & Plan:   Problem List Items Addressed This Visit    None    Visit Diagnoses    Low grade fever    -  Primary   discussed ddx, including mono, strep, CMV; if not getting better, or worsening significantly, let me know or seek medical treatment over weekend   Relevant Orders   POCT  rapid strep A (Completed)   Epstein-Barr virus VCA antibody panel (Completed)   Cytomegalovirus antibody, IgG (Completed)   CMV IgM (Completed)   CBC with Differential/Platelet (Completed)   COMPLETE METABOLIC PANEL WITH GFR (Completed)   Sore throat       ddx includes strep, viral etiology, mono, CMV, acid reflux (but would not cause fever), leukemia; sx treatment, call me if not improving   Relevant Orders   POCT rapid strep A (Completed)   Culture, Group A Strep (Completed)   Epstein-Barr virus VCA antibody panel (Completed)   Cytomegalovirus antibody, IgG (Completed)   CMV IgM (Completed)   CBC with Differential/Platelet (Completed)   COMPLETE METABOLIC PANEL WITH GFR (Completed)        Follow up plan: Return if symptoms worsen or fail to improve.  An after-visit summary was printed and given to the patient at check-out.  Please see the patient instructions which may contain other information and recommendations beyond what is mentioned above in the assessment and plan.  No orders of the defined types were placed in this encounter.   Orders Placed This Encounter  Procedures  . Culture, Group A Strep  . Epstein-Barr virus VCA antibody panel  . Cytomegalovirus antibody, IgG  . CMV IgM  . CBC with Differential/Platelet  . COMPLETE METABOLIC PANEL WITH GFR  . POCT rapid strep A

## 2016-05-24 LAB — EPSTEIN-BARR VIRUS VCA ANTIBODY PANEL
EBV NA IgG: <18 U/mL
EBV VCA IgG: <18 U/mL
EBV VCA IgM: <36 U/mL

## 2016-05-25 LAB — CULTURE, GROUP A STREP: Organism ID, Bacteria: NORMAL

## 2016-05-25 LAB — CYTOMEGALOVIRUS ANTIBODY, IGG: Cytomegalovirus Ab-IgG: <0.6 U/mL (ref <0.60)

## 2016-05-25 LAB — CMV IGM: CMV IgM: <30 AU/mL (ref <30.00)

## 2016-05-29 ENCOUNTER — Ambulatory Visit (INDEPENDENT_AMBULATORY_CARE_PROVIDER_SITE_OTHER): Payer: BC Managed Care – PPO | Admitting: Family Medicine

## 2016-05-29 ENCOUNTER — Encounter: Payer: Self-pay | Admitting: Family Medicine

## 2016-05-29 DIAGNOSIS — J312 Chronic pharyngitis: Secondary | ICD-10-CM

## 2016-05-29 DIAGNOSIS — E119 Type 2 diabetes mellitus without complications: Secondary | ICD-10-CM

## 2016-05-29 DIAGNOSIS — L708 Other acne: Secondary | ICD-10-CM | POA: Diagnosis not present

## 2016-05-29 MED ORDER — AMOXICILLIN-POT CLAVULANATE 875-125 MG PO TABS
1.0000 | ORAL_TABLET | Freq: Two times a day (BID) | ORAL | 0 refills | Status: AC
Start: 2016-05-29 — End: 2016-06-08

## 2016-05-29 NOTE — Assessment & Plan Note (Signed)
Now in its fourth week; reviewed previous tests; since no improvement, still with mildly enlarged bilateral symmetric cervical lymphadenopathy, will treat with augmentin x 10 days; discussed risk of C diff; if symptoms not improving by end of this week or early next week, contact me; he agrees

## 2016-05-29 NOTE — Patient Instructions (Addendum)
Start the antibiotics Please do eat yogurt daily or take a probiotic daily for the next month or two We want to replace the healthy germs in the gut If you notice foul, watery diarrhea in the next two months, schedule an appointment RIGHT AWAY If not feeling better later this week or early next, call me  Try vitamin C (vitamin C tablets) and drink green tea to help your immune system during your illness Get plenty of rest and hydration

## 2016-05-29 NOTE — Progress Notes (Signed)
BP 116/60   Pulse 82   Temp 98.5 F (36.9 C) (Oral)   Resp 14   Wt 259 lb (117.5 kg)   SpO2 97%   BMI 35.13 kg/m    Subjective:    Patient ID: Ryan Ritter, male    DOB: April 18, 1966, 50 y.o.   MRN: [id number]  HPI: Ryan Ritter is a 50 y.o. male  Chief Complaint  Patient presents with  . Sore Throat    never went away   He is still having sore throat; he was seen last week; had labs done (reviewed); still having sore lymph nodes anterior neck; no bleeding, no unusual rash; no one else sick; does get occasional sinus infections; he had a little cough, nothing major; low grade fevers; no antibiotics yet, but he does take doxy daily for recurrent boils; he has not checked his sugars to see if glucose up with infection  Relevant past medical, surgical, family and social history reviewed Past Medical History:  Diagnosis Date  . Allergy   . Diabetes mellitus without complication ([place])   . Hyperlipidemia   . Hypertension   . Obesity 03/14/2015  . Sleep apnea    Family History  Problem Relation Age of Onset  . Breast cancer Mother   . Colon polyps Mother   . Diabetes Father   . Pancreatic cancer Father   . Diabetes Brother   . Diabetes Maternal Grandmother    Social History  Substance Use Topics  . Smoking status: Never Smoker  . Smokeless tobacco: Former Systems developer    Types: Chew    Quit date: 10/01/1988  . Alcohol use No   Interim medical history since last visit reviewed. Allergies and medications reviewed  Review of Systems Per HPI unless specifically indicated above     Objective:    BP 116/60   Pulse 82   Temp 98.5 F (36.9 C) (Oral)   Resp 14   Wt 259 lb (117.5 kg)   SpO2 97%   BMI 35.13 kg/m   Wt Readings from Last 3 Encounters:  05/29/16 259 lb (117.5 kg)  05/23/16 261 lb 6.4 oz (118.6 kg)  04/20/16 263 lb (119.3 kg)    Physical Exam  Constitutional: He appears well-developed and well-nourished.  Non-toxic appearance. He does not have a sickly  appearance. He does not appear ill. No distress.  HENT:  Right Ear: Hearing, tympanic membrane, external ear and ear canal normal. Tympanic membrane is not erythematous.  Left Ear: Hearing, tympanic membrane, external ear and ear canal normal. Tympanic membrane is not erythematous.  Nose: No rhinorrhea.  Mouth/Throat: Mucous membranes are normal. Posterior oropharyngeal erythema (very mild) present. No oropharyngeal exudate, posterior oropharyngeal edema or tonsillar abscesses.  Eyes: Right eye exhibits no discharge. Left eye exhibits no discharge. No scleral icterus.  Cardiovascular: Normal rate and regular rhythm.   No extrasystoles are present.  Pulmonary/Chest: Effort normal and breath sounds normal.  Abdominal: Normal appearance. There is no hepatosplenomegaly.  Lymphadenopathy:    He has cervical adenopathy (shoddy).  Skin: No bruising, no ecchymosis, no petechiae, no purpura and no rash noted. He is not diaphoretic.  Psychiatric: He has a normal mood and affect. His speech is normal. Cognition and memory are normal.   Results for orders placed or performed in visit on 05/23/16  Culture, Group A Strep  Result Value Ref Range   Organism ID, Bacteria Normal Upper Respiratory Flora    Organism ID, Bacteria No Beta Hemolytic Streptococci Isolated  Epstein-Barr virus VCA antibody panel  Result Value Ref Range   EBV VCA IgG <18.00 U/mL   EBV VCA IgM <36.00 U/mL   EBV NA IgG <18.00 U/mL   Interpretation SEE NOTE   Cytomegalovirus antibody, IgG  Result Value Ref Range   Cytomegalovirus Ab-IgG <0.60 <0.60 U/mL  CMV IgM  Result Value Ref Range   CMV IgM <30.00 <30.00 AU/mL  CBC with Differential/Platelet  Result Value Ref Range   WBC 6.1 3.8 - 10.8 K/uL   RBC 5.18 4.20 - 5.80 MIL/uL   Hemoglobin 15.7 13.2 - 17.1 g/dL   HCT 45.1 38.5 - 50.0 %   MCV 87.1 80.0 - 100.0 fL   MCH 30.3 27.0 - 33.0 pg   MCHC 34.8 32.0 - 36.0 g/dL   RDW 13.8 11.0 - 15.0 %   Platelets 145 140 - 400 K/uL     MPV 10.7 7.5 - 12.5 fL   Neutro Abs 3,843 1,500 - 7,800 cells/uL   Lymphs Abs 1,403 850 - 3,900 cells/uL   Monocytes Absolute 488 200 - 950 cells/uL   Eosinophils Absolute 305 15 - 500 cells/uL   Basophils Absolute 61 0 - 200 cells/uL   Neutrophils Relative % 63 %   Lymphocytes Relative 23 %   Monocytes Relative 8 %   Eosinophils Relative 5 %   Basophils Relative 1 %   Smear Review Criteria for review not met   COMPLETE METABOLIC PANEL WITH GFR  Result Value Ref Range   Sodium 135 135 - 146 mmol/L   Potassium 4.3 3.5 - 5.3 mmol/L   Chloride 102 98 - 110 mmol/L   CO2 22 20 - 31 mmol/L   Glucose, Bld 194 (H) 65 - 99 mg/dL   BUN 18 7 - 25 mg/dL   Creat 0.91 0.70 - 1.33 mg/dL   Total Bilirubin 0.8 0.2 - 1.2 mg/dL   Alkaline Phosphatase 32 (L) 40 - 115 U/L   AST 19 10 - 35 U/L   ALT 27 9 - 46 U/L   Total Protein 6.7 6.1 - 8.1 g/dL   Albumin 4.3 3.6 - 5.1 g/dL   Calcium 9.5 8.6 - 10.3 mg/dL   GFR, Est African American >89 >=60 mL/min   GFR, Est Non African American >89 >=60 mL/min  POCT rapid strep A  Result Value Ref Range   Rapid Strep A Screen Negative Negative      Assessment & Plan:   Problem List Items Addressed This Visit      Respiratory   Chronic pharyngitis    Now in its fourth week; reviewed previous tests; since no improvement, still with mildly enlarged bilateral symmetric cervical lymphadenopathy, will treat with augmentin x 10 days; discussed risk of C diff; if symptoms not improving by end of this week or early next week, contact me; he agrees        Endocrine   Diabetes mellitus, type 2 ([place])    Not sure if sugars have gone up with infection, he reports; if symptoms persist, check glucose        Musculoskeletal and Integument   Acne    Stop the doxyxycline while on the augmentin; he agrees       Other Visit Diagnoses   None.     Follow up plan: No Follow-up on file.  An after-visit summary was printed and given to the patient at [facility].   Please see the patient instructions which may contain other information and recommendations beyond what is  mentioned above in the assessment and plan.  Meds ordered this encounter  Medications  . amoxicillin-clavulanate (AUGMENTIN) 875-125 MG tablet    Sig: Take 1 tablet by mouth 2 (two) times daily.    Dispense:  20 tablet    Refill:  0    No orders of the defined types were placed in this encounter.

## 2016-05-29 NOTE — Assessment & Plan Note (Signed)
Not sure if sugars have gone up with infection, he reports; if symptoms persist, check glucose

## 2016-05-29 NOTE — Assessment & Plan Note (Signed)
Stop the doxyxycline while on the augmentin; he agrees

## 2016-07-10 ENCOUNTER — Ambulatory Visit (INDEPENDENT_AMBULATORY_CARE_PROVIDER_SITE_OTHER): Payer: BC Managed Care – PPO | Admitting: Family Medicine

## 2016-07-10 ENCOUNTER — Encounter: Payer: Self-pay | Admitting: Family Medicine

## 2016-07-10 VITALS — BP 120/68 | HR 82 | Temp 99.4°F | Resp 18 | Ht 72.0 in | Wt 261.9 lb

## 2016-07-10 DIAGNOSIS — J029 Acute pharyngitis, unspecified: Secondary | ICD-10-CM

## 2016-07-10 DIAGNOSIS — J019 Acute sinusitis, unspecified: Secondary | ICD-10-CM | POA: Diagnosis not present

## 2016-07-10 LAB — POCT RAPID STREP A (OFFICE): Rapid Strep A Screen: NEGATIVE

## 2016-07-10 MED ORDER — AZITHROMYCIN 250 MG PO TABS: ORAL_TABLET | ORAL | 0 refills | Status: DC

## 2016-07-10 NOTE — Progress Notes (Signed)
Name: Ryan Ritter   MRN: [id number]    DOB: 1966-05-28   Date:07/10/2016       Progress Note  Subjective  Chief Complaint  Chief Complaint  Patient presents with  . Sinusitis  . Sore Throat  This patient is usually followed by Dr. Thana Ates, new to me  Sinusitis  This is a new problem. The current episode started in the past 7 days. The problem is unchanged. There has been no fever. His pain is at a severity of 6/10. Associated symptoms include congestion, coughing, headaches and sinus pressure. Pertinent negatives include no chills, ear pain or shortness of breath. Past treatments include nothing.  Sore Throat   This is a new problem. The current episode started in the past 7 days. There has been no fever. The pain is at a severity of 5/10. Associated symptoms include congestion, coughing and headaches. Pertinent negatives include no ear pain or shortness of breath.     Past Medical History:  Diagnosis Date  . Allergy   . Diabetes mellitus without complication (HCC)   . Hyperlipidemia   . Hypertension   . Obesity 03/14/2015  . Sleep apnea     Past Surgical History:  Procedure Laterality Date  . tendinitis Left   . vascectomy  08/2014    Family History  Problem Relation Age of Onset  . Diabetes Father   . Pancreatic cancer Father   . Diabetes Brother   . Diabetes Maternal Grandmother   . Breast cancer Mother   . Colon polyps Mother     Social History   Social History  . Marital status: Married    Spouse name: N/A  . Number of children: N/A  . Years of education: N/A   Occupational History  . Not on file.   Social History Main Topics  . Smoking status: Never Smoker  . Smokeless tobacco: Former Neurosurgeon    Types: Chew    Quit date: 10/01/1988  . Alcohol use No  . Drug use: No  . Sexual activity: Yes    Partners: Female   Other Topics Concern  . Not on file   Social History Narrative  . No narrative on file     Current Outpatient Prescriptions:  .   aspirin 81 MG tablet, Take 81 mg by mouth daily., Disp: , Rfl:  .  CALCIUM-MAGNESIUM-ZINC PO, Take 1 tablet by mouth daily., Disp: , Rfl:  .  doxycycline (VIBRAMYCIN) 100 MG capsule, , Disp: , Rfl:  .  FARXIGA 10 MG TABS tablet, TAKE ONE TABLET BY MOUTH EVERY DAY, Disp: 30 tablet, Rfl: 6 .  fluticasone (FLONASE) 50 MCG/ACT nasal spray, Place 2 sprays into both nostrils daily., Disp: , Rfl:  .  glimepiride (AMARYL) 4 MG tablet, Take 1 tablet (4 mg total) by mouth daily before breakfast., Disp: 30 tablet, Rfl: 5 .  levocetirizine (XYZAL) 5 MG tablet, Take 5 mg by mouth every evening. , Disp: , Rfl:  .  losartan (COZAAR) 50 MG tablet, Take 1 tablet (50 mg total) by mouth daily., Disp: 30 tablet, Rfl: 5 .  POTASSIUM GLUCONATE PO, Take 1 tablet by mouth daily., Disp: , Rfl:  .  rosuvastatin (CRESTOR) 20 MG tablet, Take 0.5 tablets (10 mg total) by mouth at bedtime., Disp: 15 tablet, Rfl: 2 .  sitaGLIPtin-metformin (JANUMET) 50-1000 MG tablet, Take 1 tablet by mouth 2 (two) times daily with a meal., Disp: 180 tablet, Rfl: 1 .  VASCEPA 1 g CAPS, TAKE 2 CAPSULES BY  MOUTH TWICE DAILY, Disp: 120 capsule, Rfl: 5  Allergies  Allergen Reactions  . Benicar [Olmesartan] Other (See Comments)  . Lisinopril Other (See Comments)     Review of Systems  Constitutional: Positive for fever. Negative for chills.  HENT: Positive for congestion and sinus pressure. Negative for ear pain.   Respiratory: Positive for cough. Negative for shortness of breath.   Cardiovascular: Negative for chest pain.  Neurological: Positive for headaches.    Objective  Vitals:   07/10/16 1505  BP: 120/68  Pulse: 82  Resp: 18  Temp: 99.4 F (37.4 C)  TempSrc: Oral  SpO2: 98%  Weight: 261 lb 14.4 oz (118.8 kg)  Height: 6' (1.829 m)    Physical Exam  Constitutional: He is well-developed, well-nourished, and in no distress.  HENT:  Head: Normocephalic and atraumatic.  Right Ear: Tympanic membrane and ear canal normal.   Left Ear: Tympanic membrane and ear canal normal.  Mouth/Throat: No posterior oropharyngeal erythema.  Nasal turbinate hypertrophy, mucosal inflammation.  Cardiovascular: Normal rate, regular rhythm, S1 normal and S2 normal.   No murmur heard. Pulmonary/Chest: Effort normal and breath sounds normal. He has no wheezes.  Psychiatric: Mood, memory, affect and judgment normal.  Nursing note and vitals reviewed.     Assessment & Plan  1. Sore throat Rapid strep testing is negative - POCT rapid strep A  2. Acute sinusitis, recurrence not specified, unspecified location Recommended conservative therapy including rinsingwith warm water, using Flonase etc. If still symptomatic, may start taking azithromycin. - azithromycin (ZITHROMAX) 250 MG tablet; 2 tabs po day 1, then 1 tab po q day x 4 days  Dispense: 6 tablet; Refill: 0   Syed Asad A. Faylene KurtzShah [facility] 07/10/2016 3:31 PM

## 2016-07-23 ENCOUNTER — Ambulatory Visit: Payer: BC Managed Care – PPO | Admitting: Family Medicine

## 2016-07-27 ENCOUNTER — Encounter: Payer: Self-pay | Admitting: Family Medicine

## 2016-07-27 ENCOUNTER — Ambulatory Visit (INDEPENDENT_AMBULATORY_CARE_PROVIDER_SITE_OTHER): Payer: BC Managed Care – PPO | Admitting: Family Medicine

## 2016-07-27 VITALS — BP 118/68 | HR 95 | Temp 98.4°F | Resp 14 | Wt 262.0 lb

## 2016-07-27 DIAGNOSIS — J312 Chronic pharyngitis: Secondary | ICD-10-CM | POA: Diagnosis not present

## 2016-07-27 DIAGNOSIS — E119 Type 2 diabetes mellitus without complications: Secondary | ICD-10-CM | POA: Diagnosis not present

## 2016-07-27 DIAGNOSIS — Z6835 Body mass index (BMI) 35.0-35.9, adult: Secondary | ICD-10-CM | POA: Diagnosis not present

## 2016-07-27 DIAGNOSIS — E349 Endocrine disorder, unspecified: Secondary | ICD-10-CM

## 2016-07-27 DIAGNOSIS — R590 Localized enlarged lymph nodes: Secondary | ICD-10-CM | POA: Diagnosis not present

## 2016-07-27 DIAGNOSIS — E6609 Other obesity due to excess calories: Secondary | ICD-10-CM

## 2016-07-27 DIAGNOSIS — E782 Mixed hyperlipidemia: Secondary | ICD-10-CM

## 2016-07-27 DIAGNOSIS — IMO0001 Reserved for inherently not codable concepts without codable children: Secondary | ICD-10-CM

## 2016-07-27 NOTE — Patient Instructions (Addendum)
We'll get a scan of your neck If you have not heard anything from my staff in a week about any orders/referrals/studies from today, please contact us here to follow-up [phone] Don't take the metformin for 2 days before or 2 days after your IV contrast / scan Stay well-hydrated Return next week for labs (fasting) Try to limit saturated fats in your diet (bologna, hot dogs, barbeque, cheeseburgers, hamburgers, steak, bacon, sausage, cheese, etc.) and get more fresh fruits, vegetables, and whole grains Check out the information at familydoctor.org entitled "Nutrition for Weight Loss: What You Need to Know about Fad Diets" Try to lose between 1-2 pounds per week by taking in fewer calories and burning off more calories You can succeed by limiting portions, limiting foods dense in calories and fat, becoming more active, and drinking 8 glasses of water a day (64 ounces) Don't skip meals, especially breakfast, as skipping meals may alter your metabolism Do not use over-the-counter weight loss pills or gimmicks that claim rapid weight loss A healthy BMI (or body mass index) is between 18.5 and 24.9 You can calculate your ideal BMI at the NIH website [employer]huhttp://www.nhlbi.nih.gov/health/educational/lose_wt/BMI/bmicalc.htm

## 2016-07-27 NOTE — Progress Notes (Signed)
BP 118/68   Pulse 95   Temp 98.4 F (36.9 C) (Oral)   Resp 14   Wt 262 lb (118.8 kg)   SpO2 95%   BMI 35.53 kg/m    Subjective:    Patient ID: Ryan Ritter, male    DOB: 1966-02-24, 50 y.o.   MRN: [id number]  HPI: Ryan Ritter is a 50 y.o. male  Chief Complaint  Patient presents with  . Follow-up   Since I saw him, patient saw colleague here for sore throat; treated with azithromycin, helped whatever it was that he had; wife and child both sick; both wife and duaghter also had sinus infection; still feels like there is swelling in the neck; left side worse than right; occasional night sweats, nothing new; no fevers; no unexplained weight loss; energy level is not much better  Type 2 diabetes; not much dry mouth; occasionally might get up once at night to urinate, not often; no problems with feet; eye exam UTD  No fam hx of liver disease  High blood pressure; well-controlled today  Obesity; not successful with weight loss  Depression screen [facility] 2/9 07/27/2016 04/20/2016 07/15/2015 07/04/2015 04/08/2015  Decreased Interest 0 0 0 0 0  Down, Depressed, Hopeless 0 0 0 0 0  PHQ - 2 Score 0 0 0 0 0   Relevant past medical, surgical, family and social history reviewed Past Medical History:  Diagnosis Date  . Allergy   . Diabetes mellitus without complication (HCC)   . Hyperlipidemia   . Obesity 03/14/2015  . Sleep apnea    Past Surgical History:  Procedure Laterality Date  . tendinitis Left   . vascectomy  08/2014   Family History  Problem Relation Age of Onset  . Diabetes Father   . Pancreatic cancer Father   . Diabetes Brother   . Diabetes Maternal Grandmother   . Breast cancer Mother   . Colon polyps Mother    Social History  Substance Use Topics  . Smoking status: Never Smoker  . Smokeless tobacco: Former Neurosurgeon    Types: Chew    Quit date: 10/01/1988  . Alcohol use No   Interim medical history since last visit reviewed. Allergies and medications  reviewed  Review of Systems Per HPI unless specifically indicated above     Objective:    BP 118/68   Pulse 95   Temp 98.4 F (36.9 C) (Oral)   Resp 14   Wt 262 lb (118.8 kg)   SpO2 95%   BMI 35.53 kg/m   Wt Readings from Last 3 Encounters:  07/27/16 262 lb (118.8 kg)  07/10/16 261 lb 14.4 oz (118.8 kg)  05/29/16 259 lb (117.5 kg)    Physical Exam  Constitutional: He appears well-developed and well-nourished.  Non-toxic appearance. He does not have a sickly appearance. He does not appear ill. No distress.  HENT:  Right Ear: Hearing, tympanic membrane, external ear and ear canal normal. Tympanic membrane is not erythematous.  Left Ear: Hearing, tympanic membrane, external ear and ear canal normal. Tympanic membrane is not erythematous.  Nose: No mucosal edema or rhinorrhea.  Mouth/Throat: Mucous membranes are normal. No oropharyngeal exudate, posterior oropharyngeal edema, posterior oropharyngeal erythema (very mild) or tonsillar abscesses.  Eyes: Right eye exhibits no discharge. Left eye exhibits no discharge. No scleral icterus.  Cardiovascular: Normal rate and regular rhythm.   No extrasystoles are present.  Pulmonary/Chest: Effort normal and breath sounds normal.  Abdominal: Normal appearance. There is no hepatosplenomegaly.  Lymphadenopathy:    He has cervical adenopathy (shoddy).  Skin: No bruising, no ecchymosis, no petechiae, no purpura and no rash noted. He is not diaphoretic.  Psychiatric: He has a normal mood and affect. His speech is normal. His mood appears not anxious. Cognition and memory are normal. He does not exhibit a depressed mood.   Diabetic Foot Form - Detailed   Diabetic Foot Exam - detailed Diabetic Foot exam was performed with the following findings:  Yes 07/27/2016  5:39 PM  Visual Foot Exam completed.:  Yes  Are the toenails ingrown?:  No Normal Range of Motion:  Yes Pulse Foot Exam completed.:  Yes  Right Dorsalis Pedis:  Present Left Dorsalis  Pedis:  Present  Sensory Foot Exam Completed.:  Yes Semmes-Weinstein Monofilament Test R Site 1-Great Toe:  Pos L Site 1-Great Toe:  Pos  R Site 4:  Pos L Site 4:  Pos  R Site 5:  Pos L Site 5:  Pos           Assessment & Plan:   Problem List Items Addressed This Visit      Respiratory   Chronic pharyngitis    With swollen glands x 2 months      Relevant Orders   CT Soft Tissue Neck W Contrast (Completed)   Lactate Dehydrogenase (LDH) (Completed)   CBC with Differential/Platelet (Completed)     Endocrine   Diabetes mellitus, type 2 (HCC)    Check A1c; limit whites; avoiding sugary drinks; foot exam by MD today; eye exams yearly; continue aspirin      Relevant Orders   Hemoglobin A1c (Completed)   COMPLETE METABOLIC PANEL WITH GFR (Completed)     Immune and Lymphatic   Cervical lymphadenopathy    Going on for 2 months now; no night sweats or weight loss, but will get neck CT      Relevant Orders   CT Soft Tissue Neck W Contrast (Completed)   Lactate Dehydrogenase (LDH) (Completed)   CBC with Differential/Platelet (Completed)     Other   Obesity - Primary    Try to lose 10-15 pounds every 3 months      Hypotestosteronism    Pt not interested in checking or seeing urologist at this time; open invitation; risk associated with testosterone      Combined fat and carbohydrate induced hyperlipemia    Check lipids next week when fasting; on crestor lower dose, limit saturated      Relevant Orders   Lipid panel (Completed)      Follow up plan: Return in about 4 months (around 11/12/2016) for fasting labs and visit.  An after-visit summary was printed and given to the patient at check-out.  Please see the patient instructions which may contain other information and recommendations beyond what is mentioned above in the assessment and plan.  No orders of the defined types were placed in this encounter.   Orders Placed This Encounter  Procedures  . CT Soft  Tissue Neck W Contrast  . Lactate Dehydrogenase (LDH)  . Lipid panel  . CBC with Differential/Platelet  . Hemoglobin A1c  . COMPLETE METABOLIC PANEL WITH GFR

## 2016-07-27 NOTE — Assessment & Plan Note (Signed)
Check A1c; limit whites; avoiding sugary drinks; foot exam by MD today; eye exams yearly; continue aspirin

## 2016-07-27 NOTE — Assessment & Plan Note (Signed)
Going on for 2 months now; no night sweats or weight loss, but will get neck CT

## 2016-07-27 NOTE — Assessment & Plan Note (Signed)
Pt not interested in checking or seeing urologist at this time; open invitation; risk associated with testosterone

## 2016-07-27 NOTE — Assessment & Plan Note (Signed)
Check lipids next week when fasting; on crestor lower dose, limit saturated

## 2016-07-27 NOTE — Assessment & Plan Note (Signed)
With swollen glands x 2 months

## 2016-07-27 NOTE — Assessment & Plan Note (Signed)
Try to lose 10-15 pounds every 3 months

## 2016-08-01 LAB — CBC WITH DIFFERENTIAL/PLATELET
Basophils Absolute: 0 cells/uL (ref 0–200)
Basophils Relative: 0 %
Eosinophils Absolute: 300 cells/uL (ref 15–500)
Eosinophils Relative: 6 %
HCT: 45.7 % (ref 38.5–50.0)
Hemoglobin: 15.6 g/dL (ref 13.2–17.1)
Lymphocytes Relative: 22 %
Lymphs Abs: 1100 cells/uL (ref 850–3900)
MCH: 29.8 pg (ref 27.0–33.0)
MCHC: 34.1 g/dL (ref 32.0–36.0)
MCV: 87.4 fL (ref 80.0–100.0)
MPV: 10 fL (ref 7.5–12.5)
Monocytes Absolute: 450 cells/uL (ref 200–950)
Monocytes Relative: 9 %
Neutro Abs: 3150 cells/uL (ref 1500–7800)
Neutrophils Relative %: 63 %
Platelets: 133 10*3/uL — ABNORMAL LOW (ref 140–400)
RBC: 5.23 MIL/uL (ref 4.20–5.80)
RDW: 14.2 % (ref 11.0–15.0)
WBC: 5 10*3/uL (ref 3.8–10.8)

## 2016-08-01 LAB — LIPID PANEL
Cholesterol: 108 mg/dL — ABNORMAL LOW (ref 125–200)
HDL: 24 mg/dL — ABNORMAL LOW (ref >=40)
LDL Cholesterol: 35 mg/dL (ref <130)
Total CHOL/HDL Ratio: 4.5 Ratio (ref <=5.0)
Triglycerides: 246 mg/dL — ABNORMAL HIGH (ref <150)
VLDL: 49 mg/dL — ABNORMAL HIGH (ref <30)

## 2016-08-01 LAB — COMPLETE METABOLIC PANEL WITH GFR
ALT: 24 U/L (ref 9–46)
AST: 18 U/L (ref 10–35)
Albumin: 4.2 g/dL (ref 3.6–5.1)
Alkaline Phosphatase: 31 U/L — ABNORMAL LOW (ref 40–115)
BUN: 14 mg/dL (ref 7–25)
CO2: 27 mmol/L (ref 20–31)
Calcium: 9 mg/dL (ref 8.6–10.3)
Chloride: 101 mmol/L (ref 98–110)
Creat: 0.89 mg/dL (ref 0.70–1.33)
GFR, Est African American: >89 mL/min (ref >=60)
GFR, Est Non African American: >89 mL/min (ref >=60)
Glucose, Bld: 245 mg/dL — ABNORMAL HIGH (ref 65–99)
Potassium: 4.2 mmol/L (ref 3.5–5.3)
Sodium: 137 mmol/L (ref 135–146)
Total Bilirubin: 0.9 mg/dL (ref 0.2–1.2)
Total Protein: 6.4 g/dL (ref 6.1–8.1)

## 2016-08-02 LAB — HEMOGLOBIN A1C
Hgb A1c MFr Bld: 8.2 % — ABNORMAL HIGH (ref <5.7)
Mean Plasma Glucose: 189 mg/dL

## 2016-08-02 LAB — LACTATE DEHYDROGENASE: LDH: 145 U/L (ref 120–250)

## 2016-08-03 ENCOUNTER — Ambulatory Visit
Admission: RE | Admit: 2016-08-03 | Discharge: 2016-08-03 | Disposition: A | Discharge status: Home / Self Care | Payer: BC Managed Care – PPO | Source: Ambulatory Visit | Attending: Family Medicine | Admitting: Family Medicine

## 2016-08-03 ENCOUNTER — Other Ambulatory Visit: Payer: Self-pay | Admitting: Family Medicine

## 2016-08-03 DIAGNOSIS — J312 Chronic pharyngitis: Secondary | ICD-10-CM | POA: Insufficient documentation

## 2016-08-03 DIAGNOSIS — R590 Localized enlarged lymph nodes: Secondary | ICD-10-CM | POA: Insufficient documentation

## 2016-08-03 MED ORDER — IOPAMIDOL (ISOVUE-370) INJECTION 76%
75.0000 mL | Freq: Once | INTRAVENOUS | Status: AC | PRN
  Administered 2016-08-03: 75 mL via INTRAVENOUS

## 2016-08-03 MED ORDER — EMPAGLIFLOZIN 10 MG PO TABS: 10.0000 mg | ORAL_TABLET | Freq: Every day | ORAL | 2 refills | Status: DC

## 2016-08-03 NOTE — Progress Notes (Signed)
Adding Jardiance. 

## 2016-08-06 ENCOUNTER — Other Ambulatory Visit: Payer: Self-pay | Admitting: Family Medicine

## 2016-08-08 ENCOUNTER — Other Ambulatory Visit: Payer: Self-pay | Admitting: Family Medicine

## 2016-08-08 DIAGNOSIS — M4302 Spondylolysis, cervical region: Secondary | ICD-10-CM | POA: Insufficient documentation

## 2016-08-08 NOTE — Progress Notes (Signed)
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

## 2016-08-08 NOTE — Assessment & Plan Note (Signed)
Refer to ortho spine specialist

## 2016-08-15 DIAGNOSIS — M542 Cervicalgia: Secondary | ICD-10-CM

## 2016-09-06 ENCOUNTER — Other Ambulatory Visit: Payer: Self-pay | Admitting: Family Medicine

## 2016-09-06 NOTE — Telephone Encounter (Signed)
I am not going to prescribe BID doxy; I dont' see an indication for that high of a dose; Janumet approved

## 2016-09-20 ENCOUNTER — Other Ambulatory Visit: Payer: Self-pay | Admitting: Family Medicine

## 2016-10-02 ENCOUNTER — Other Ambulatory Visit: Payer: Self-pay | Admitting: Family Medicine

## 2016-10-02 NOTE — Telephone Encounter (Signed)
Glitch in computer system; I was not getting refill requests; addressing today ---------- rx approved 

## 2016-10-02 NOTE — Telephone Encounter (Signed)
I don't see a reason to treat chronically with BID doxycycline rx denied ------------------------- Glitch in computer system; I was not getting refill requests; addressing today

## 2016-11-05 ENCOUNTER — Telehealth: Payer: Self-pay

## 2016-11-05 NOTE — Telephone Encounter (Signed)
Called pt regarding a notice that was sent to Dr. lada about not having an diabetic exam done. Talked to patient and mention he should of got a letter in the mail stating that Dr. Charlette CaffeyMorrissey is retired. Pt stated he did. I mention to patient we have a new provided by the name of dr. Shaaron AdlerMelidna lada and would like to meet with to discuss his diabetes and the eye exam.

## 2016-11-08 ENCOUNTER — Ambulatory Visit (INDEPENDENT_AMBULATORY_CARE_PROVIDER_SITE_OTHER): Payer: BC Managed Care – PPO | Admitting: Family Medicine

## 2016-11-08 ENCOUNTER — Encounter: Payer: Self-pay | Admitting: Family Medicine

## 2016-11-08 VITALS — BP 126/62 | HR 82 | Temp 98.2°F | Resp 16 | Wt 259.5 lb

## 2016-11-08 DIAGNOSIS — E1165 Type 2 diabetes mellitus with hyperglycemia: Secondary | ICD-10-CM | POA: Diagnosis not present

## 2016-11-08 DIAGNOSIS — L708 Other acne: Secondary | ICD-10-CM

## 2016-11-08 DIAGNOSIS — I10 Essential (primary) hypertension: Secondary | ICD-10-CM

## 2016-11-08 DIAGNOSIS — R0989 Other specified symptoms and signs involving the circulatory and respiratory systems: Secondary | ICD-10-CM | POA: Diagnosis not present

## 2016-11-08 DIAGNOSIS — Z6835 Body mass index (BMI) 35.0-35.9, adult: Secondary | ICD-10-CM | POA: Diagnosis not present

## 2016-11-08 DIAGNOSIS — J312 Chronic pharyngitis: Secondary | ICD-10-CM

## 2016-11-08 DIAGNOSIS — R590 Localized enlarged lymph nodes: Secondary | ICD-10-CM

## 2016-11-08 DIAGNOSIS — E6609 Other obesity due to excess calories: Secondary | ICD-10-CM

## 2016-11-08 DIAGNOSIS — E786 Lipoprotein deficiency: Secondary | ICD-10-CM | POA: Diagnosis not present

## 2016-11-08 DIAGNOSIS — IMO0001 Reserved for inherently not codable concepts without codable children: Secondary | ICD-10-CM

## 2016-11-08 LAB — LIPID PANEL
Cholesterol: 114 mg/dL (ref <200)
HDL: 28 mg/dL — ABNORMAL LOW (ref >40)
LDL Cholesterol: 51 mg/dL (ref <100)
Total CHOL/HDL Ratio: 4.1 Ratio (ref <5.0)
Triglycerides: 177 mg/dL — ABNORMAL HIGH (ref <150)
VLDL: 35 mg/dL — ABNORMAL HIGH (ref <30)

## 2016-11-08 MED ORDER — EMPAGLIFLOZIN 10 MG PO TABS: 10.0000 mg | ORAL_TABLET | Freq: Every day | ORAL | 5 refills | Status: DC

## 2016-11-08 MED ORDER — DOXYCYCLINE HYCLATE 50 MG PO CAPS: 50.0000 mg | ORAL_CAPSULE | Freq: Every day | ORAL | 5 refills | Status: DC

## 2016-11-08 NOTE — Assessment & Plan Note (Signed)
Previous doctor had him on doxy 100 mg BID; I am not comfortable with that; will use doxy 50 mg daily

## 2016-11-08 NOTE — Assessment & Plan Note (Signed)
Encouragement given to lose weight 

## 2016-11-08 NOTE — Progress Notes (Signed)
BP 126/62   Pulse 82   Temp 98.2 F (36.8 C) (Oral)   Resp 16   Wt 259 lb 8 oz (117.7 kg)   SpO2 97%   BMI 35.19 kg/m    Subjective:    Patient ID: Ryan Ritter, male    DOB: 1965-10-13, 51 y.o.   MRN: [id number]  HPI: Ryan Ritter is a 51 y.o. male  Chief Complaint  Patient presents with  . Follow-up   Type 2 diabetes Last A1c 8.2 Little bit of yellow on great toenails; no other foot problems Last eye exam about a year ago; no blurred vision No dry mouth; nocturia x 1 only if sleeping more than 6 hours Walking dog 30-45 minutes so hoping that's helping; he's lost a few pounds over the holidays  HTN; not really checking BP away from doctor, not necessary; eats salt, just a little  Sinuses are a mess this time of year; no fevers; not blowing anything out really; using decongestant (warned)  Throat isn't sore, but glands were a little swollen; still thinks they are a little  Not much problem with reflux  High cholesterol; on medicines; there is room for improvement; low HDL; has three eggs and bacon for breakfast Lab Results  Component Value Date   CHOL 108 (L) 08/01/2016   HDL 24 (L) 08/01/2016   LDLCALC 35 08/01/2016   TRIG 246 (H) 08/01/2016   CHOLHDL 4.5 08/01/2016   He has acne; has been using doxy 100 mg BID from previous doctor  Depression screen [facility] 2/9 11/08/2016 07/27/2016 04/20/2016 07/15/2015 07/04/2015  Decreased Interest 0 0 0 0 0  Down, Depressed, Hopeless 0 0 0 0 0  PHQ - 2 Score 0 0 0 0 0   Relevant past medical, surgical, family and social history reviewed Past Medical History:  Diagnosis Date  . Allergy   . Diabetes mellitus without complication ([place])   . Hyperlipidemia   . Obesity 03/14/2015  . Sleep apnea    Past Surgical History:  Procedure Laterality Date  . tendinitis Left   . vascectomy  08/2014   Family History  Problem Relation Age of Onset  . Diabetes Father   . Pancreatic cancer Father   . Diabetes Brother   . Diabetes  Maternal Grandmother   . Breast cancer Mother   . Colon polyps Mother    Social History  Substance Use Topics  . Smoking status: Never Smoker  . Smokeless tobacco: Former Systems developer    Types: Chew    Quit date: 10/01/1988  . Alcohol use No   Interim medical history since last visit reviewed. Allergies and medications reviewed  Review of Systems Per HPI unless specifically indicated above     Objective:    BP 126/62   Pulse 82   Temp 98.2 F (36.8 C) (Oral)   Resp 16   Wt 259 lb 8 oz (117.7 kg)   SpO2 97%   BMI 35.19 kg/m   Wt Readings from Last 3 Encounters:  11/08/16 259 lb 8 oz (117.7 kg)  07/27/16 262 lb (118.8 kg)  07/10/16 261 lb 14.4 oz (118.8 kg)    Physical Exam  Constitutional: He appears well-developed and well-nourished.  Non-toxic appearance. He does not have a sickly appearance. He does not appear ill. No distress.  HENT:  Right Ear: Hearing, tympanic membrane, external ear and ear canal normal. Tympanic membrane is not erythematous.  Left Ear: Hearing, tympanic membrane, external ear and ear canal normal.  Tympanic membrane is not erythematous.  Nose: No mucosal edema or rhinorrhea. Right sinus exhibits no maxillary sinus tenderness and no frontal sinus tenderness. Left sinus exhibits no maxillary sinus tenderness and no frontal sinus tenderness.  Mouth/Throat: Mucous membranes are normal. No oropharyngeal exudate, posterior oropharyngeal edema, posterior oropharyngeal erythema or tonsillar abscesses.  Eyes: Right eye exhibits no discharge. Left eye exhibits no discharge. No scleral icterus.  Cardiovascular: Normal rate and regular rhythm.   No extrasystoles are present.  Pulmonary/Chest: Effort normal and breath sounds normal.  Abdominal: Normal appearance. There is no hepatosplenomegaly.  Lymphadenopathy:    He has cervical adenopathy (shoddy).  Skin: No bruising, no ecchymosis, no petechiae, no purpura and no rash noted. He is not diaphoretic.  Psychiatric: He  has a normal mood and affect. His speech is normal. His mood appears not anxious. Cognition and memory are normal. He does not exhibit a depressed mood.   Diabetic Foot Form - Detailed   Diabetic Foot Exam - detailed Diabetic Foot exam was performed with the following findings:  Yes 11/08/2016  9:13 AM  Visual Foot Exam completed.:  Yes  Are the toenails ingrown?:  No Normal Range of Motion:  Yes Pulse Foot Exam completed.:  Yes  Right Dorsalis Pedis:  Present Left Dorsalis Pedis:  Present  Sensory Foot Exam Completed.:  Yes Swelling:  No Semmes-Weinstein Monofilament Test R Site 1-Great Toe:  Pos L Site 1-Great Toe:  Pos  R Site 4:  Pos L Site 4:  Pos  R Site 5:  Pos L Site 5:  Pos        Results for orders placed or performed in visit on 07/27/16  Lactate Dehydrogenase (LDH)  Result Value Ref Range   LDH 145 120 - 250 U/L  Lipid panel  Result Value Ref Range   Cholesterol 108 (L) 125 - 200 mg/dL   Triglycerides 246 (H) <150 mg/dL   HDL 24 (L) >=40 mg/dL   Total CHOL/HDL Ratio 4.5 <=5.0 Ratio   VLDL 49 (H) <30 mg/dL   LDL Cholesterol 35 <130 mg/dL  CBC with Differential/Platelet  Result Value Ref Range   WBC 5.0 3.8 - 10.8 K/uL   RBC 5.23 4.20 - 5.80 MIL/uL   Hemoglobin 15.6 13.2 - 17.1 g/dL   HCT 45.7 38.5 - 50.0 %   MCV 87.4 80.0 - 100.0 fL   MCH 29.8 27.0 - 33.0 pg   MCHC 34.1 32.0 - 36.0 g/dL   RDW 14.2 11.0 - 15.0 %   Platelets 133 (L) 140 - 400 K/uL   MPV 10.0 7.5 - 12.5 fL   Neutro Abs 3,150 1,500 - 7,800 cells/uL   Lymphs Abs 1,100 850 - 3,900 cells/uL   Monocytes Absolute 450 200 - 950 cells/uL   Eosinophils Absolute 300 15 - 500 cells/uL   Basophils Absolute 0 0 - 200 cells/uL   Neutrophils Relative % 63 %   Lymphocytes Relative 22 %   Monocytes Relative 9 %   Eosinophils Relative 6 %   Basophils Relative 0 %   Smear Review Criteria for review not met   Hemoglobin A1c  Result Value Ref Range   Hgb A1c MFr Bld 8.2 (H) <5.7 %   Mean Plasma Glucose 189  mg/dL  COMPLETE METABOLIC PANEL WITH GFR  Result Value Ref Range   Sodium 137 135 - 146 mmol/L   Potassium 4.2 3.5 - 5.3 mmol/L   Chloride 101 98 - 110 mmol/L   CO2 27 20 -  31 mmol/L   Glucose, Bld 245 (H) 65 - 99 mg/dL   BUN 14 7 - 25 mg/dL   Creat 0.89 0.70 - 1.33 mg/dL   Total Bilirubin 0.9 0.2 - 1.2 mg/dL   Alkaline Phosphatase 31 (L) 40 - 115 U/L   AST 18 10 - 35 U/L   ALT 24 9 - 46 U/L   Total Protein 6.4 6.1 - 8.1 g/dL   Albumin 4.2 3.6 - 5.1 g/dL   Calcium 9.0 8.6 - 10.3 mg/dL   GFR, Est African American >89 >=60 mL/min   GFR, Est Non African American >89 >=60 mL/min      Assessment & Plan:   Problem List Items Addressed This Visit      Cardiovascular and Mediastinum   Benign essential HTN - Primary    Excellent control        Respiratory   Chronic sinus complaints    Refer to ENT; avoid decongestants      Relevant Medications   doxycycline (VIBRAMYCIN) 50 MG capsule   Other Relevant Orders   Ambulatory referral to ENT   Chronic pharyngitis    With enlarged lymph nodes; will refer to ENT      Relevant Orders   Ambulatory referral to ENT     Endocrine   Diabetes mellitus, type 2 ([place])    Foot exam by MD; keep working on weight loss; check A1c      Relevant Medications   empagliflozin (JARDIANCE) 10 MG TABS tablet   Other Relevant Orders   Hemoglobin A1c     Musculoskeletal and Integument   Acne    Previous doctor had him on doxy 100 mg BID; I am not comfortable with that; will use doxy 50 mg daily      Relevant Medications   doxycycline (VIBRAMYCIN) 50 MG capsule     Immune and Lymphatic   Cervical lymphadenopathy    Refer to ENT      Relevant Orders   Ambulatory referral to ENT     Other   Obesity    Encouragement given to lose weight      Relevant Medications   empagliflozin (JARDIANCE) 10 MG TABS tablet   Low HDL (under 40)    Walking dog should help; keep working on weight loss      Relevant Orders   Lipid panel        Follow up plan: Return in about 3 months (around 02/05/2017) for diabetes if A1c is 7 or higher; 6 months if under 7.  An after-visit summary was printed and given to the patient at [facility].  Please see the patient instructions which may contain other information and recommendations beyond what is mentioned above in the assessment and plan.  Meds ordered this encounter  Medications  . meloxicam (MOBIC) 15 MG tablet    Sig: Take 15 mg by mouth as needed.  . doxycycline (VIBRAMYCIN) 50 MG capsule    Sig: Take 1 capsule (50 mg total) by mouth daily.    Dispense:  30 capsule    Refill:  5  . empagliflozin (JARDIANCE) 10 MG TABS tablet    Sig: Take 10 mg by mouth daily. For diabetes    Dispense:  30 tablet    Refill:  5    Orders Placed This Encounter  Procedures  . Hemoglobin A1c  . Lipid panel  . Ambulatory referral to ENT

## 2016-11-08 NOTE — Assessment & Plan Note (Signed)
Excellent control.   

## 2016-11-08 NOTE — Patient Instructions (Addendum)
Try to use PLAIN allergy medicine without the decongestant Avoid: phenylephrine, phenylpropanolamine, and pseudoephredine  Try to limit eggs (yolk) to no more than 3 per week Try to eat less than 20 grams of saturated fat a day Consider oatmeal or other whole grain for breakfast Keep up the great efforts with walking and weight loss Let's get labs today I've referred you to the Ear Nose Throat doctor  If you have not heard anything from my staff in a week about any orders/referrals/studies from today, please contact us here to follow-up [phone]

## 2016-11-08 NOTE — Assessment & Plan Note (Signed)
Walking dog should help; keep working on weight loss

## 2016-11-08 NOTE — Assessment & Plan Note (Signed)
Foot exam by MD; keep working on weight loss; check A1c

## 2016-11-08 NOTE — Assessment & Plan Note (Signed)
With enlarged lymph nodes; will refer to ENT

## 2016-11-08 NOTE — Assessment & Plan Note (Signed)
Refer to ENT

## 2016-11-08 NOTE — Assessment & Plan Note (Signed)
Refer to ENT; avoid decongestants

## 2016-11-09 LAB — HEMOGLOBIN A1C
Hgb A1c MFr Bld: 7.5 % — ABNORMAL HIGH (ref <5.7)
Mean Plasma Glucose: 169 mg/dL

## 2017-01-01 ENCOUNTER — Other Ambulatory Visit: Payer: Self-pay | Admitting: Family Medicine

## 2017-01-01 NOTE — Telephone Encounter (Signed)
rxs approved 

## 2017-02-02 ENCOUNTER — Other Ambulatory Visit: Payer: Self-pay | Admitting: Family Medicine

## 2017-02-02 NOTE — Telephone Encounter (Signed)
Duplicate request for amaryl; I approved 7 month supply on 01/01/17 Spoke with Zella Ballobin; he has plenty of refills

## 2017-02-21 ENCOUNTER — Ambulatory Visit: Payer: BC Managed Care – PPO | Admitting: Family Medicine

## 2017-02-21 ENCOUNTER — Other Ambulatory Visit: Payer: Self-pay

## 2017-02-21 DIAGNOSIS — I10 Essential (primary) hypertension: Secondary | ICD-10-CM

## 2017-02-21 DIAGNOSIS — E1165 Type 2 diabetes mellitus with hyperglycemia: Secondary | ICD-10-CM

## 2017-02-21 DIAGNOSIS — E786 Lipoprotein deficiency: Secondary | ICD-10-CM

## 2017-02-21 LAB — COMPLETE METABOLIC PANEL WITH GFR
ALT: 24 U/L (ref 9–46)
AST: 19 U/L (ref 10–35)
Albumin: 4.3 g/dL (ref 3.6–5.1)
Alkaline Phosphatase: 27 U/L — ABNORMAL LOW (ref 40–115)
BUN: 17 mg/dL (ref 7–25)
CO2: 25 mmol/L (ref 20–31)
Calcium: 8.9 mg/dL (ref 8.6–10.3)
Chloride: 103 mmol/L (ref 98–110)
Creat: 0.87 mg/dL (ref 0.70–1.33)
GFR, Est African American: >89 mL/min (ref >=60)
GFR, Est Non African American: >89 mL/min (ref >=60)
Glucose, Bld: 255 mg/dL — ABNORMAL HIGH (ref 65–99)
Potassium: 4.2 mmol/L (ref 3.5–5.3)
Sodium: 136 mmol/L (ref 135–146)
Total Bilirubin: 0.8 mg/dL (ref 0.2–1.2)
Total Protein: 6.5 g/dL (ref 6.1–8.1)

## 2017-02-21 LAB — LIPID PANEL
Cholesterol: 97 mg/dL (ref <200)
HDL: 24 mg/dL — ABNORMAL LOW (ref >40)
LDL Cholesterol: 37 mg/dL (ref <100)
Total CHOL/HDL Ratio: 4 Ratio (ref <5.0)
Triglycerides: 182 mg/dL — ABNORMAL HIGH (ref <150)
VLDL: 36 mg/dL — ABNORMAL HIGH (ref <30)

## 2017-02-22 LAB — HEMOGLOBIN A1C
Hgb A1c MFr Bld: 8 % — ABNORMAL HIGH (ref <5.7)
Mean Plasma Glucose: 183 mg/dL

## 2017-02-26 ENCOUNTER — Ambulatory Visit (INDEPENDENT_AMBULATORY_CARE_PROVIDER_SITE_OTHER): Payer: BC Managed Care – PPO | Admitting: Family Medicine

## 2017-02-26 ENCOUNTER — Encounter: Payer: Self-pay | Admitting: Family Medicine

## 2017-02-26 DIAGNOSIS — Z5181 Encounter for therapeutic drug level monitoring: Secondary | ICD-10-CM | POA: Diagnosis not present

## 2017-02-26 DIAGNOSIS — I10 Essential (primary) hypertension: Secondary | ICD-10-CM

## 2017-02-26 DIAGNOSIS — E786 Lipoprotein deficiency: Secondary | ICD-10-CM

## 2017-02-26 DIAGNOSIS — E1165 Type 2 diabetes mellitus with hyperglycemia: Secondary | ICD-10-CM

## 2017-02-26 DIAGNOSIS — E785 Hyperlipidemia, unspecified: Secondary | ICD-10-CM | POA: Diagnosis not present

## 2017-02-26 DIAGNOSIS — E6609 Other obesity due to excess calories: Secondary | ICD-10-CM

## 2017-02-26 DIAGNOSIS — Z6834 Body mass index (BMI) 34.0-34.9, adult: Secondary | ICD-10-CM

## 2017-02-26 MED ORDER — EMPAGLIFLOZIN 25 MG PO TABS: 25.0000 mg | ORAL_TABLET | Freq: Every day | ORAL | 11 refills | Status: DC

## 2017-02-26 MED ORDER — ROSUVASTATIN CALCIUM 5 MG PO TABS
5.0000 mg | ORAL_TABLET | Freq: Every day | ORAL | 6 refills | Status: DC
Start: 2017-02-26 — End: 2017-05-29

## 2017-02-26 NOTE — Progress Notes (Signed)
BP 122/74   Pulse 90   Temp 97.1 F (36.2 C) (Oral)   Resp 14   Ht 6' (1.829 m)   Wt 254 lb 8 oz (115.4 kg)   SpO2 97%   BMI 34.52 kg/m    Subjective:    Patient ID: Ryan Rawracy L Beaumont, male    DOB: Apr 02, 1966, 51 y.o.   MRN: [id number]  HPI: Ryan Ritter is a 51 y.o. male  Chief Complaint  Patient presents with  . Follow-up    HPI Patient is here for f/u  He has type 2 diabetes; working longer hours at work; affects what he can do with cooking at home Lab Results  Component Value Date   HGBA1C 8.0 (H) 02/21/2017   On jardiance 10 mg; urinating frequently even before the jardiance No blurred vision, no dry mouth, no headaches Limiting carbs; rare soft drink, splenda Will get eye exam soon Tried Victoza and no weight loss  Obesity; he knows his eating has not been as good as it could be, work part of the problem; he is interested in the keto diet  High cholesterol; on crestor 20 mg just half of a pill; not much fatty meat; does eat broccoli and salads; not much oatmeal  [facility] 02/26/2017 11/08/2016 07/27/2016 04/20/2016 07/15/2015  Decreased Interest 0 0 0 0 0  Down, Depressed, Hopeless 0 0 0 0 0  PHQ - 2 Score 0 0 0 0 0    Relevant past medical, surgical, family and social history reviewed Past Medical History:  Diagnosis Date  . Allergy   . Diabetes mellitus without complication (HCC)   . Hyperlipidemia   . Obesity 03/14/2015  . Sleep apnea    Past Surgical History:  Procedure Laterality Date  . tendinitis Left   . vascectomy  08/2014   Family History  Problem Relation Age of Onset  . Diabetes Father   . Pancreatic cancer Father   . Diabetes Brother   . Diabetes Maternal Grandmother   . Breast cancer Mother   . Colon polyps Mother    Social History   Social History  . Marital status: Married    Spouse name: N/A  . Number of children: N/A  . Years of education: N/A   Occupational History  . Not on file.   Social History Main  Topics  . Smoking status: Never Smoker  . Smokeless tobacco: Former NeurosurgeonUser    Types: Chew    Quit date: 10/01/1988  . Alcohol use No  . Drug use: No  . Sexual activity: Yes    Partners: Female   Other Topics Concern  . Not on file   Social History Narrative  . No narrative on file   Interim medical history since last visit reviewed. Allergies and medications reviewed  Review of Systems Per HPI unless specifically indicated above     Objective:    BP 122/74   Pulse 90   Temp 97.1 F (36.2 C) (Oral)   Resp 14   Ht 6' (1.829 m)   Wt 254 lb 8 oz (115.4 kg)   SpO2 97%   BMI 34.52 kg/m   Wt Readings from Last 3 Encounters:  02/26/17 254 lb 8 oz (115.4 kg)  11/08/16 259 lb 8 oz (117.7 kg)  07/27/16 262 lb (118.8 kg)    Physical Exam  Constitutional: He appears well-developed and well-nourished.  Non-toxic appearance. He does not have a sickly appearance. He does not appear ill.  No distress.  HENT:  Right Ear: Hearing normal.  Left Ear: Hearing normal.  Mouth/Throat: Mucous membranes are normal.  Eyes: No scleral icterus.  Cardiovascular: Normal rate and regular rhythm.   No extrasystoles are present.  Pulmonary/Chest: Effort normal and breath sounds normal.  Abdominal: Soft. Normal appearance and bowel sounds are normal. He exhibits no distension. There is no hepatosplenomegaly.  Skin: No rash noted. He is not diaphoretic. No pallor.  Psychiatric: He has a normal mood and affect. His speech is normal. His mood appears not anxious. Cognition and memory are normal. He does not exhibit a depressed mood.   Diabetic Foot Form - Detailed   Diabetic Foot Exam - detailed Diabetic Foot exam was performed with the following findings:  Yes 02/26/2017  6:44 PM  Visual Foot Exam completed.:  Yes  Are the toenails ingrown?:  No Normal Range of Motion:  Yes Pulse Foot Exam completed.:  Yes  Right Dorsalis Pedis:  Present Left Dorsalis Pedis:  Present  Sensory Foot Exam Completed.:   Yes Swelling:  No Semmes-Weinstein Monofilament Test R Site 1-Great Toe:  Pos L Site 1-Great Toe:  Pos  R Site 4:  Pos L Site 4:  Pos  R Site 5:  Pos L Site 5:  Pos        Results for orders placed or performed in visit on 02/21/17  COMPLETE METABOLIC PANEL WITH GFR  Result Value Ref Range   Sodium 136 135 - 146 mmol/L   Potassium 4.2 3.5 - 5.3 mmol/L   Chloride 103 98 - 110 mmol/L   CO2 25 20 - 31 mmol/L   Glucose, Bld 255 (H) 65 - 99 mg/dL   BUN 17 7 - 25 mg/dL   Creat 1.61 0.96 - 0.45 mg/dL   Total Bilirubin 0.8 0.2 - 1.2 mg/dL   Alkaline Phosphatase 27 (L) 40 - 115 U/L   AST 19 10 - 35 U/L   ALT 24 9 - 46 U/L   Total Protein 6.5 6.1 - 8.1 g/dL   Albumin 4.3 3.6 - 5.1 g/dL   Calcium 8.9 8.6 - 40.9 mg/dL   GFR, Est African American >89 >=60 mL/min   GFR, Est Non African American >89 >=60 mL/min  Lipid panel  Result Value Ref Range   Cholesterol 97 <200 mg/dL   Triglycerides 811 (H) <150 mg/dL   HDL 24 (L) >91 mg/dL   Total CHOL/HDL Ratio 4.0 <5.0 Ratio   VLDL 36 (H) <30 mg/dL   LDL Cholesterol 37 <478 mg/dL  HgB G9F  Result Value Ref Range   Hgb A1c MFr Bld 8.0 (H) <5.7 %   Mean Plasma Glucose 183 mg/dL      Assessment & Plan:   Problem List Items Addressed This Visit      Cardiovascular and Mediastinum   Benign essential HTN    Well-controlled; continue regimen; work on weight loss      Relevant Medications   rosuvastatin (CRESTOR) 5 MG tablet     Endocrine   Diabetes mellitus, type 2 (HCC)    Uncontrolled; will increase Jardiance; work on weight loss; stressed the importance of weight reduction for diabetes; foot exam by MD today; check A1c in 3 months; patient will have labs done a few days prior to next visit; eye exams yearly      Relevant Medications   empagliflozin (JARDIANCE) 25 MG TABS tablet   rosuvastatin (CRESTOR) 5 MG tablet   Other Relevant Orders   Hemoglobin A1c  Other   Obesity    Encouraged patient to work on weight loss;  220.5 pounds would get his BMI out of obesity category and into overweight category; he is thinking about trying the keto diet; he'll read about Belviq and Contrave; he already tried Victoza and did not lose weight      Relevant Medications   empagliflozin (JARDIANCE) 25 MG TABS tablet   Medication monitoring encounter    Check labs on meds every 6 months or with dose increases      Low HDL (under 40)    Weight loss is so important; used BMI calculator to get him to BMI < 30 (he will need to be 220.5 pounds or less)      Hyperlipidemia LDL goal <100    encouragd weight loss; discussed dietary approaches for reducing lipids; weight loss will help TG and HDL; LDL is so low that I am decreasing his crestor from 10 mg daily (he had been taking half of a 20 mg pill), to just 5 mg daily; recheck in 3 months; however, patient is interested in keto diet; he is welcome to come in 4-6 weeks after starting this to make sure it's not adversely affecting his lipid panel (staff may enter fasting lipid panel)      Relevant Medications   rosuvastatin (CRESTOR) 5 MG tablet   Other Relevant Orders   Lipid panel       Follow up plan: Return in about 3 months (around 05/29/2017) for twenty minute follow-up with fasting labs.  An after-visit summary was printed and given to the patient at check-out.  Please see the patient instructions which may contain other information and recommendations beyond what is mentioned above in the assessment and plan.  Meds ordered this encounter  Medications  . empagliflozin (JARDIANCE) 25 MG TABS tablet    Sig: Take 25 mg by mouth daily.    Dispense:  30 tablet    Refill:  11    Increasing the dose  . rosuvastatin (CRESTOR) 5 MG tablet    Sig: Take 1 tablet (5 mg total) by mouth at bedtime.    Dispense:  30 tablet    Refill:  6    We're reducing dose, LDL only 37 on 10 mg nightly    Orders Placed This Encounter  Procedures  . Lipid panel  . Hemoglobin A1c

## 2017-02-26 NOTE — Assessment & Plan Note (Signed)
Well-controlled; continue regimen; work on weight loss

## 2017-02-26 NOTE — Assessment & Plan Note (Signed)
Encouraged patient to work on weight loss; 220.5 pounds would get his BMI out of obesity category and into overweight category; he is thinking about trying the keto diet; he'll read about Belviq and Contrave; he already tried Victoza and did not lose weight

## 2017-02-26 NOTE — Patient Instructions (Addendum)
Increase the Jardiance from 10 mg to 25 mg daily If needed, we can make further adjustments  Let's shoot over the next year for 220.5 pounds Check out the information at familydoctor.org entitled "Nutrition for Weight Loss: What You Need to Know about Fad Diets" Try to lose between 1-2 pounds per week by taking in fewer calories and burning off more calories You can succeed by limiting portions, limiting foods dense in calories and fat, becoming more active, and drinking 8 glasses of water a day (64 ounces) Don't skip meals, especially breakfast, as skipping meals may alter your metabolism Do not use over-the-counter weight loss pills or gimmicks that claim rapid weight loss A healthy BMI (or body mass index) is between 18.5 and 24.9 You can calculate your ideal BMI at the NIH website JobE[employer]ics.huhttp://www.nhlbi.nih.gov/health/educational/lose_wt/BMI/bmicalc.htm  Read about Belviq and Contrave for weight loss If you are interested in doing the keto diet and want to monitor your labs, please do come by about 4 weeks after starting (fasting labs)

## 2017-02-26 NOTE — Assessment & Plan Note (Signed)
Weight loss is so important; used BMI calculator to get him to BMI < 30 (he will need to be 220.5 pounds or less)

## 2017-02-26 NOTE — Assessment & Plan Note (Signed)
encouragd weight loss; discussed dietary approaches for reducing lipids; weight loss will help TG and HDL; LDL is so low that I am decreasing his crestor from 10 mg daily (he had been taking half of a 20 mg pill), to just 5 mg daily; recheck in 3 months; however, patient is interested in keto diet; he is welcome to come in 4-6 weeks after starting this to make sure it's not adversely affecting his lipid panel (staff may enter fasting lipid panel)

## 2017-02-26 NOTE — Assessment & Plan Note (Signed)
Check labs on meds every 6 months or with dose increases

## 2017-02-26 NOTE — Assessment & Plan Note (Signed)
Uncontrolled; will increase Jardiance; work on weight loss; stressed the importance of weight reduction for diabetes; foot exam by MD today; check A1c in 3 months; patient will have labs done a few days prior to next visit; eye exams yearly

## 2017-03-11 ENCOUNTER — Other Ambulatory Visit: Payer: Self-pay | Admitting: Family Medicine

## 2017-04-08 ENCOUNTER — Other Ambulatory Visit: Payer: Self-pay | Admitting: Family Medicine

## 2017-04-08 NOTE — Telephone Encounter (Signed)
Doxy refill request a month too soon denied

## 2017-05-03 ENCOUNTER — Other Ambulatory Visit: Payer: Self-pay | Admitting: Family Medicine

## 2017-05-23 ENCOUNTER — Telehealth: Payer: Self-pay | Admitting: Family Medicine

## 2017-05-23 NOTE — Telephone Encounter (Signed)
PT HAS AN APPT FOR 37M FU WITH LABS. HE IS WANTING TO COME IN ON Monday TO DO HIS LABS IF YOU WILL AGREE TO GIVE HIM THE ORDER. PLEASE ADVISE.

## 2017-05-24 ENCOUNTER — Other Ambulatory Visit: Payer: Self-pay

## 2017-05-24 DIAGNOSIS — E1165 Type 2 diabetes mellitus with hyperglycemia: Secondary | ICD-10-CM

## 2017-05-24 DIAGNOSIS — E785 Hyperlipidemia, unspecified: Secondary | ICD-10-CM

## 2017-05-24 NOTE — Telephone Encounter (Signed)
That's fine; the orders were actually placed back in May so they are in the chart already Thank you

## 2017-05-24 NOTE — Telephone Encounter (Signed)
Patient notified

## 2017-05-27 ENCOUNTER — Other Ambulatory Visit: Payer: Self-pay | Admitting: Family Medicine

## 2017-05-28 LAB — LIPID PANEL
Cholesterol: 116 mg/dL (ref <200)
HDL: 25 mg/dL — ABNORMAL LOW (ref >40)
LDL Cholesterol: 36 mg/dL (ref <100)
Total CHOL/HDL Ratio: 4.6 Ratio (ref <5.0)
Triglycerides: 274 mg/dL — ABNORMAL HIGH (ref <150)
VLDL: 55 mg/dL — ABNORMAL HIGH (ref <30)

## 2017-05-28 LAB — HEMOGLOBIN A1C
Hgb A1c MFr Bld: 8.1 % — ABNORMAL HIGH (ref <5.7)
Mean Plasma Glucose: 186 mg/dL

## 2017-05-29 ENCOUNTER — Encounter: Payer: Self-pay | Admitting: Family Medicine

## 2017-05-29 ENCOUNTER — Ambulatory Visit (INDEPENDENT_AMBULATORY_CARE_PROVIDER_SITE_OTHER): Payer: BC Managed Care – PPO | Admitting: Family Medicine

## 2017-05-29 VITALS — BP 118/62 | HR 99 | Temp 98.6°F | Resp 14 | Wt 256.1 lb

## 2017-05-29 DIAGNOSIS — R748 Abnormal levels of other serum enzymes: Secondary | ICD-10-CM

## 2017-05-29 DIAGNOSIS — Z6834 Body mass index (BMI) 34.0-34.9, adult: Secondary | ICD-10-CM | POA: Diagnosis not present

## 2017-05-29 DIAGNOSIS — E6609 Other obesity due to excess calories: Secondary | ICD-10-CM

## 2017-05-29 DIAGNOSIS — E786 Lipoprotein deficiency: Secondary | ICD-10-CM

## 2017-05-29 DIAGNOSIS — Z9989 Dependence on other enabling machines and devices: Secondary | ICD-10-CM

## 2017-05-29 DIAGNOSIS — I10 Essential (primary) hypertension: Secondary | ICD-10-CM | POA: Diagnosis not present

## 2017-05-29 DIAGNOSIS — E1165 Type 2 diabetes mellitus with hyperglycemia: Secondary | ICD-10-CM | POA: Diagnosis not present

## 2017-05-29 DIAGNOSIS — G4733 Obstructive sleep apnea (adult) (pediatric): Secondary | ICD-10-CM | POA: Diagnosis not present

## 2017-05-29 DIAGNOSIS — E785 Hyperlipidemia, unspecified: Secondary | ICD-10-CM

## 2017-05-29 HISTORY — DX: Obstructive sleep apnea (adult) (pediatric): Z99.89

## 2017-05-29 HISTORY — DX: Obstructive sleep apnea (adult) (pediatric): G47.33

## 2017-05-29 MED ORDER — ICOSAPENT ETHYL 1 G PO CAPS: 2.0000 | ORAL_CAPSULE | Freq: Two times a day (BID) | ORAL | 11 refills | Status: DC

## 2017-05-29 MED ORDER — GLIMEPIRIDE 4 MG PO TABS: ORAL_TABLET | ORAL | 11 refills | Status: DC

## 2017-05-29 MED ORDER — ROSUVASTATIN CALCIUM 5 MG PO TABS: 5.0000 mg | ORAL_TABLET | Freq: Every day | ORAL | 11 refills | Status: DC

## 2017-05-29 MED ORDER — SITAGLIPTIN PHOS-METFORMIN HCL 50-1000 MG PO TABS: 1.0000 | ORAL_TABLET | Freq: Two times a day (BID) | ORAL | 3 refills | Status: DC

## 2017-05-29 MED ORDER — DOXYCYCLINE MONOHYDRATE 50 MG PO CAPS: 50.0000 mg | ORAL_CAPSULE | Freq: Every day | ORAL | 11 refills | Status: DC

## 2017-05-29 NOTE — Assessment & Plan Note (Signed)
Refer to pulmonologist / sleep medicine specialist

## 2017-05-29 NOTE — Assessment & Plan Note (Signed)
Well controlled 

## 2017-05-29 NOTE — Assessment & Plan Note (Signed)
LDL is excellent; continue low dose statin, dietary changes, weight loss

## 2017-05-29 NOTE — Assessment & Plan Note (Addendum)
Discussed options for treatment; he prefers to not add medicine; he will work on weight loss, try to lose 15 pounds before next visit; already did diab education classes; foot exam by MD; pneumonia vaccine UTD, next due at age 51; flu shot yearly; check A1c in 3 months; given that DM runs so strongly in his family and he has a persistently low alk phos, will check ceruloplasmin to see if abnormal; will also check ferritin in case HH 

## 2017-05-29 NOTE — Progress Notes (Signed)
BP 118/62   Pulse 99   Temp 98.6 F (37 C) (Oral)   Resp 14   Wt 256 lb 1.6 oz (116.2 kg)   SpO2 97%   BMI 34.73 kg/m    Subjective:    Patient ID: Ryan Ritter, male    DOB: Dec 10, 1965, 51 y.o.   MRN: [id number]  HPI: Ryan Ritter is a 51 y.o. male  Chief Complaint  Patient presents with  . Follow-up    3 months   HPI Type 2 diabetes; last A1c noted to be high; see MyChart message Got serious about some supplements, cinnamon and chromium;  Some dietary indiscretion over the summer He was on Victoza before; did not really appreciate any weight loss; some nausea; no bad side effects Has already done the diabetes classes Lots of family hx of diabetes Will get flu shot at work; pneumonia vaccine UTD; will get eye exam soon Lab Results  Component Value Date   HGBA1C 8.1 (H) 05/27/2017   High cholesterol; taking Vascepa; hereditary, tried several things with previous doctor Taking alpha-lipoic acid, CoQ 10, and coconut oil; he read about it  Obesity; used to weigh 299 pounds; saw that and really changed his diet; when he gets to 250 pounds, he hits a wall and can't really get below that; maybe drinking enough water; never calculated steps per day, but constantly moving at work Not drinking many sugary calories; rare Gatorade at sporting event, e.g.  Has sleep apnea and uses CPAP; long time since seeing doctor; would like to see someone for that  Depression screen [facility] 2/9 05/29/2017 02/26/2017 11/08/2016 07/27/2016 04/20/2016  Decreased Interest 0 0 0 0 0  Down, Depressed, Hopeless 0 0 0 0 0  PHQ - 2 Score 0 0 0 0 0    Relevant past medical, surgical, family and social history reviewed Past Medical History:  Diagnosis Date  . Allergy   . Diabetes mellitus without complication ([place])   . Hyperlipidemia   . Obesity 03/14/2015  . OSA on CPAP 05/29/2017  . Sleep apnea    Past Surgical History:  Procedure Laterality Date  . tendinitis Left   . vascectomy  08/2014    Family History  Problem Relation Age of Onset  . Diabetes Father   . Pancreatic cancer Father   . Diabetes Brother   . Diabetes Maternal Grandmother   . Breast cancer Mother   . Colon polyps Mother    Social History   Social History  . Marital status: Married    Spouse name: N/A  . Number of children: N/A  . Years of education: N/A   Occupational History  . Not on file.   Social History Main Topics  . Smoking status: Never Smoker  . Smokeless tobacco: Former Systems developer    Types: Chew    Quit date: 10/01/1988  . Alcohol use No  . Drug use: No  . Sexual activity: Yes    Partners: Female   Other Topics Concern  . Not on file   Social History Narrative  . No narrative on file    Interim medical history since last visit reviewed. Allergies and medications reviewed  Review of Systems Per HPI unless specifically indicated above     Objective:    BP 118/62   Pulse 99   Temp 98.6 F (37 C) (Oral)   Resp 14   Wt 256 lb 1.6 oz (116.2 kg)   SpO2 97%   BMI 34.73 kg/m  Wt Readings from Last 3 Encounters:  05/29/17 256 lb 1.6 oz (116.2 kg)  02/26/17 254 lb 8 oz (115.4 kg)  11/08/16 259 lb 8 oz (117.7 kg)    Physical Exam  Constitutional: He appears well-developed and well-nourished.  HENT:  Right Ear: Hearing normal.  Left Ear: Hearing normal.  Mouth/Throat: Mucous membranes are normal.  Eyes: No scleral icterus.  Cardiovascular: Normal rate and regular rhythm.   No extrasystoles are present.  Pulmonary/Chest: Effort normal and breath sounds normal. No respiratory distress.  Abdominal: Soft. Normal appearance and bowel sounds are normal. He exhibits no distension. There is no hepatosplenomegaly.  Neurological: He is alert.  Skin: No rash noted. He is not diaphoretic. No pallor.  Psychiatric: He has a normal mood and affect. His speech is normal. His mood appears not anxious. Cognition and memory are normal. He does not exhibit a depressed mood.   Diabetic Foot  Form - Detailed   Diabetic Foot Exam - detailed Diabetic Foot exam was performed with the following findings:  Yes 05/29/2017  4:31 PM  Visual Foot Exam completed.:  Yes  Pulse Foot Exam completed.:  Yes  Right Dorsalis Pedis:  Present Left Dorsalis Pedis:  Present  Sensory Foot Exam Completed.:  Yes Semmes-Weinstein Monofilament Test R Site 1-Great Toe:  Pos L Site 1-Great Toe:  Pos        Results for orders placed or performed in visit on 05/24/17  Lipid panel  Result Value Ref Range   Cholesterol 116 <200 mg/dL   Triglycerides 274 (H) <150 mg/dL   HDL 25 (L) >40 mg/dL   Total CHOL/HDL Ratio 4.6 <5.0 Ratio   VLDL 55 (H) <30 mg/dL   LDL Cholesterol 36 <100 mg/dL  Hemoglobin A1c  Result Value Ref Range   Hgb A1c MFr Bld 8.1 (H) <5.7 %   Mean Plasma Glucose 186 mg/dL      Assessment & Plan:   Problem List Items Addressed This Visit      Cardiovascular and Mediastinum   Benign essential HTN    Well-controlled      Relevant Medications   rosuvastatin (CRESTOR) 5 MG tablet   Icosapent Ethyl (VASCEPA) 1 g CAPS     Respiratory   OSA on CPAP    Refer to pulmonologist / sleep medicine specialist      Relevant Orders   Ambulatory referral to Pulmonology     Endocrine   Diabetes mellitus, type 2 ([place]) - Primary    Discussed options for treatment; he prefers to not add medicine; he will work on weight loss, try to lose 15 pounds before next visit; already did diab education classes; foot exam by MD; pneumonia vaccine UTD, next due at age 79; flu shot yearly; check A1c in 3 months; given that DM runs so strongly in his family and he has a persistently low alk phos, will check ceruloplasmin to see if abnormal; will also check ferritin in case HH      Relevant Medications   sitaGLIPtin-metformin (JANUMET) 50-1000 MG tablet   rosuvastatin (CRESTOR) 5 MG tablet   glimepiride (AMARYL) 4 MG tablet   Other Relevant Orders   Ceruloplasmin   Ferritin     Other   Obesity     Encouraged patient to work on weight loss; asked for 20 pounds before next visit, he says he thinks he can lose 15 pounds; see AVS; explained that if he lost 40-50 pounds, the diabetes and lipids, etc would all likely be drastically  better      Relevant Medications   sitaGLIPtin-metformin (JANUMET) 50-1000 MG tablet   glimepiride (AMARYL) 4 MG tablet   Low HDL (under 40)    Encouraged weight loss; likely hereditary component      Hyperlipidemia LDL goal <100    LDL is excellent; continue low dose statin, dietary changes, weight loss      Relevant Medications   rosuvastatin (CRESTOR) 5 MG tablet   Icosapent Ethyl (VASCEPA) 1 g CAPS    Other Visit Diagnoses    Low serum alkaline phosphatase           Follow up plan: Return in about 3 months (around 08/29/2017) for twenty minute follow-up with fasting labs.  An after-visit summary was printed and given to the patient at [place].  Please see the patient instructions which may contain other information and recommendations beyond what is mentioned above in the assessment and plan.  Meds ordered this encounter  Medications  . Cinnamon 500 MG TABS    Sig: Take 1,000 tablets by mouth 2 (two) times daily.  . Coenzyme Q10 (CO Q10) 200 MG CAPS    Sig: Take 200 mg by mouth daily.  . Chromium 200 MCG CAPS    Sig: Take 200 mcg by mouth 3 (three) times daily.  . Coconut Oil 1000 MG CAPS    Sig: Take 2,000 mg by mouth 2 (two) times daily.  . Alpha-Lipoic Acid 200 MG CAPS    Sig: Take 200 mg by mouth 2 (two) times daily.  . Turmeric 500 MG CAPS    Sig: Take 500 mg by mouth daily.  . sitaGLIPtin-metformin (JANUMET) 50-1000 MG tablet    Sig: Take 1 tablet by mouth 2 (two) times daily with a meal.    Dispense:  180 tablet    Refill:  3    FOR FUTURE FILL THANKS  . rosuvastatin (CRESTOR) 5 MG tablet    Sig: Take 1 tablet (5 mg total) by mouth at bedtime.    Dispense:  30 tablet    Refill:  11    We're reducing dose, LDL only 37 on 10  mg nightly  . Icosapent Ethyl (VASCEPA) 1 g CAPS    Sig: Take 2 capsules by mouth 2 (two) times daily.    Dispense:  120 capsule    Refill:  11  . doxycycline (MONODOX) 50 MG capsule    Sig: Take 1 capsule (50 mg total) by mouth daily.    Dispense:  30 capsule    Refill:  11  . glimepiride (AMARYL) 4 MG tablet    Sig: TAKE ONE TABLET EVERY DAY BEFORE BREAKFAST    Dispense:  30 tablet    Refill:  11    FOR FUTURE FILL THANKS    Orders Placed This Encounter  Procedures  . Ceruloplasmin  . Ferritin  . Ambulatory referral to Pulmonology

## 2017-05-29 NOTE — Assessment & Plan Note (Signed)
Encouraged weight loss; likely hereditary component

## 2017-05-29 NOTE — Patient Instructions (Addendum)
Take the glimepiride in the morning Check out the information at familydoctor.org entitled "Nutrition for Weight Loss: What You Need to Know about Fad Diets" Try to lose between 1-2 pounds per week by taking in fewer calories and burning off more calories You can succeed by limiting portions, limiting foods dense in calories and fat, becoming more active, and drinking 8 glasses of water a day (64 ounces) Don't skip meals, especially breakfast, as skipping meals may alter your metabolism Do not use over-the-counter weight loss pills or gimmicks that claim rapid weight loss A healthy BMI (or body mass index) is between 18.5 and 24.9 You can calculate your ideal BMI at the NIH website JobE[employer].huhttp://www.nhlbi.nih.gov/health/educational/lose_wt/BMI/bmicalc.htm Aim for 15 pounds over the next 3 months

## 2017-05-29 NOTE — Assessment & Plan Note (Signed)
Encouraged patient to work on weight loss; asked for 20 pounds before next visit, he says he thinks he can lose 15 pounds; see AVS; explained that if he lost 40-50 pounds, the diabetes and lipids, etc would all likely be drastically better

## 2017-05-30 ENCOUNTER — Ambulatory Visit: Payer: BC Managed Care – PPO | Admitting: Family Medicine

## 2017-05-30 LAB — FERRITIN: Ferritin: 57 ng/mL (ref 20–380)

## 2017-05-31 LAB — CERULOPLASMIN: Ceruloplasmin: 25 mg/dL (ref 18–36)

## 2017-06-19 ENCOUNTER — Encounter: Payer: Self-pay | Admitting: Podiatry

## 2017-06-19 ENCOUNTER — Ambulatory Visit (INDEPENDENT_AMBULATORY_CARE_PROVIDER_SITE_OTHER): Payer: BC Managed Care – PPO

## 2017-06-19 ENCOUNTER — Ambulatory Visit (INDEPENDENT_AMBULATORY_CARE_PROVIDER_SITE_OTHER): Payer: BC Managed Care – PPO | Admitting: Podiatry

## 2017-06-19 DIAGNOSIS — M779 Enthesopathy, unspecified: Secondary | ICD-10-CM

## 2017-06-19 DIAGNOSIS — M84375A Stress fracture, left foot, initial encounter for fracture: Secondary | ICD-10-CM

## 2017-06-20 ENCOUNTER — Telehealth: Payer: Self-pay | Admitting: Podiatry

## 2017-06-20 NOTE — Progress Notes (Signed)
She presents today chief complaint of pain to the left lateral foot states that had this pain and is dark area for the past 2 weeks. He denies any trauma. He states it is painful to ambulate and is painful with shoe gear.  Objective: Vital signs are stable he is alert and oriented 3. Pulses are palpable. He has an area of indurated erythema overlying the fourth and fifth metatarsal bases left foot. He has no pain on dorsiflexion or on palpation of the extensor digitorum brevis. Radiographs demonstrates on the oblique view and area of periostitis overlying the fifth and fourth metatarsal base at the cuneiform cuboid articulation.  Assessment: It appears to be a stress fracture left foot.  Plan: Place him in a cam boot. Follow up with him in a few weeks.

## 2017-06-20 NOTE — Telephone Encounter (Signed)
I saw Dr. Al Corpus yesterday and I'm in a boot. I was calling to see if you have a toe protector I can buy to put on the boot to wear at work. Please call me back at [phone]. Thank you.

## 2017-07-01 ENCOUNTER — Ambulatory Visit (INDEPENDENT_AMBULATORY_CARE_PROVIDER_SITE_OTHER): Payer: BC Managed Care – PPO | Admitting: Internal Medicine

## 2017-07-01 ENCOUNTER — Encounter: Payer: Self-pay | Admitting: Internal Medicine

## 2017-07-01 VITALS — BP 144/76 | HR 84 | Resp 16 | Ht 72.0 in | Wt 257.0 lb

## 2017-07-01 DIAGNOSIS — G4719 Other hypersomnia: Secondary | ICD-10-CM

## 2017-07-01 DIAGNOSIS — G4733 Obstructive sleep apnea (adult) (pediatric): Secondary | ICD-10-CM

## 2017-07-01 NOTE — Progress Notes (Signed)
[facility] [place] [facility] Consultation      Assessment and Plan:  51 year old male with obstructive sleep apnea.  Obstructive sleep apnea. -Known history of obstructive sleep apnea currently on CPAP, with increasing daytime sleepiness, and malfunctioning of CPAP device. -Discussed with patient will need to restart him on CPAP. However due to insurance regulations, we will be required to retest him to reestablish the diagnosis of obstructive sleep apnea, then he can be restarted on CPAP.  Diabetes mellitus. -Sleep apnea can contribute to elevated blood glucose levels, therefore, treatment of sleep apnea. His needed to adequately control the patient's diabetes mellitus.  Orders Placed This Encounter  Procedures  . Home sleep test   Return in about 3 months (around 10/01/2017).   Date: 07/01/2017  MRN# [id number] Ryan Ritter April 18, 1966   Ryan Ritter is a 51 y.o. old male seen in consultation for chief complaint of:    Chief Complaint  Patient presents with  . Advice Only    Referred by Dr. Sherie Don Sleep Consult  . daytime sleepiness    snoring    HPI:   The patient is a 51 year old male with a history of obstructive sleep apnea. He was diagnosed with this about 15-20 years ago, he has been on CPAP since that time, with the same machine. His machine works ok, but he has noted that he has been more tired during the day. He also finds that the machine does not start well and takes time to start up.  He is not snoring.  He gets new supplies via internet.    PMHX:   Past Medical History:  Diagnosis Date  . Allergy   . Diabetes mellitus without complication (HCC)   . Hyperlipidemia   . Obesity 03/14/2015  . OSA on CPAP 05/29/2017  . Sleep apnea    Surgical Hx:  Past Surgical History:  Procedure Laterality Date  . tendinitis Left   . vascectomy  08/2014   Family Hx:  Family History  Problem Relation Age of Onset  . Diabetes Father   . Pancreatic cancer Father   .  Diabetes Brother   . Diabetes Maternal Grandmother   . Breast cancer Mother   . Colon polyps Mother    Social Hx:   Social History  Substance Use Topics  . Smoking status: Never Smoker  . Smokeless tobacco: Former Neurosurgeon    Types: Chew    Quit date: 10/01/1988  . Alcohol use No   Medication:    Current Outpatient Prescriptions:  .  Alpha-Lipoic Acid 200 MG CAPS, Take 200 mg by mouth 2 (two) times daily., Disp: , Rfl:  .  aspirin 81 MG tablet, Take 81 mg by mouth daily., Disp: , Rfl:  .  CALCIUM-MAGNESIUM-ZINC PO, Take 1 tablet by mouth daily., Disp: , Rfl:  .  Chromium 200 MCG CAPS, Take 200 mcg by mouth 3 (three) times daily., Disp: , Rfl:  .  Cinnamon 500 MG TABS, Take 1,000 tablets by mouth 2 (two) times daily., Disp: , Rfl:  .  Coconut Oil 1000 MG CAPS, Take 2,000 mg by mouth 2 (two) times daily., Disp: , Rfl:  .  Coenzyme Q10 (CO Q10) 200 MG CAPS, Take 200 mg by mouth daily., Disp: , Rfl:  .  doxycycline (MONODOX) 50 MG capsule, Take 1 capsule (50 mg total) by mouth daily., Disp: 30 capsule, Rfl: 11 .  empagliflozin (JARDIANCE) 25 MG TABS tablet, Take 25 mg by mouth daily., Disp: 30 tablet, Rfl: 11 .  fluticasone (FLONASE) 50 MCG/ACT nasal spray, Place 2 sprays into both nostrils daily., Disp: , Rfl:  .  glimepiride (AMARYL) 4 MG tablet, TAKE ONE TABLET EVERY DAY BEFORE BREAKFAST, Disp: 30 tablet, Rfl: 11 .  Icosapent Ethyl (VASCEPA) 1 g CAPS, Take 2 capsules by mouth 2 (two) times daily., Disp: 120 capsule, Rfl: 11 .  levocetirizine (XYZAL) 5 MG tablet, Take 5 mg by mouth every evening. , Disp: , Rfl:  .  losartan (COZAAR) 50 MG tablet, TAKE ONE TABLET EVERY DAY, Disp: 30 tablet, Rfl: 6 .  meloxicam (MOBIC) 15 MG tablet, Take 15 mg by mouth as needed., Disp: , Rfl:  .  POTASSIUM GLUCONATE PO, Take 1 tablet by mouth daily., Disp: , Rfl:  .  rosuvastatin (CRESTOR) 5 MG tablet, Take 1 tablet (5 mg total) by mouth at bedtime., Disp: 30 tablet, Rfl: 11 .  sitaGLIPtin-metformin  (JANUMET) 50-1000 MG tablet, Take 1 tablet by mouth 2 (two) times daily with a meal., Disp: 180 tablet, Rfl: 3 .  Turmeric 500 MG CAPS, Take 500 mg by mouth daily., Disp: , Rfl:    Allergies:  Benicar [olmesartan] and Lisinopril  Review of Systems: Gen:  Denies  fever, sweats, chills HEENT: Denies blurred vision, double vision. bleeds, sore throat Cvc:  No dizziness, chest pain. Resp:   Denies cough or sputum production, shortness of breath Gi: Denies swallowing difficulty, stomach pain. Gu:  Denies bladder incontinence, burning urine Ext:   No Joint pain, stiffness. Skin: No skin rash,  hives  Endoc:  No polyuria, polydipsia. Psych: No depression, insomnia. Other:  All other systems were reviewed with the patient and were negative other that what is mentioned in the HPI.   Physical Examination:   VS: BP (!) 144/76 (BP Location: Left Arm, Cuff Size: Large)   Pulse 84   Resp 16   Ht 6' (1.829 m)   Wt 257 lb (116.6 kg)   SpO2 98%   BMI 34.86 kg/m   General Appearance: No distress  Neuro:without focal findings,  speech normal,  HEENT: PERRLA, EOM intact.  Mallampati 3 Pulmonary: normal breath sounds, No wheezing.  CardiovascularNormal S1,S2.  No m/r/g.   Abdomen: Benign, Soft, non-tender. Renal:  No costovertebral tenderness  GU:  No performed at this time. Endoc: No evident thyromegaly, no signs of acromegaly. Skin:   warm, no rashes, no ecchymosis  Extremities: normal, no cyanosis, clubbing.  Other findings:    LABORATORY PANEL:   CBC No results for input(s): WBC, HGB, HCT, PLT in the last 168 hours. ------------------------------------------------------------------------------------------------------------------  Chemistries  No results for input(s): NA, K, CL, CO2, GLUCOSE, BUN, CREATININE, CALCIUM, MG, AST, ALT, ALKPHOS, BILITOT in the last 168 hours.  Invalid input(s):  GFRCGP ------------------------------------------------------------------------------------------------------------------  Cardiac Enzymes No results for input(s): TROPONINI in the last 168 hours. ------------------------------------------------------------  RADIOLOGY:  No results found.     Thank  you for the consultation and for allowing [facility] to assist in the care of your patient. Our recommendations are noted above.  Please contact us if we can be of further service.   Wells Guiles, MD.  Board Certified in Internal Medicine, Pulmonary Medicine, Critical Care Medicine, and Sleep Medicine.  [facility] Office Number: [phone]  Santiago Glad, M.D.  Billy Fischer, M.D  07/01/2017

## 2017-07-01 NOTE — Patient Instructions (Signed)

## 2017-07-03 ENCOUNTER — Encounter: Payer: Self-pay | Admitting: Podiatry

## 2017-07-03 ENCOUNTER — Ambulatory Visit (INDEPENDENT_AMBULATORY_CARE_PROVIDER_SITE_OTHER): Payer: BC Managed Care – PPO | Admitting: Podiatry

## 2017-07-03 ENCOUNTER — Ambulatory Visit (INDEPENDENT_AMBULATORY_CARE_PROVIDER_SITE_OTHER): Payer: BC Managed Care – PPO

## 2017-07-03 DIAGNOSIS — M84375D Stress fracture, left foot, subsequent encounter for fracture with routine healing: Secondary | ICD-10-CM

## 2017-07-03 NOTE — Progress Notes (Signed)
He presents today for follow-up of his stress fracture fourth fifth metatarsocuboid articulation area. He states that he is greater than 50% improved continues to wear his Cam Walker. This Cam Gilford Rile has started to fatigue and tear. States that he felt something pop just the other night and was hoping that it wasn't a fracture 3.  Objective: Vital signs are stable he is alert and oriented 3 much decrease in erythema and ecchymosis overlying the fourth fifth met cuboid area I see no signs of major stress fracture or cortical interruption. Much decrease in tenderness on palpation.  Assessment: Stress fracture fourth fifth met cuboid articulation.  Plan: We'll get a new Cam Walker and we'll follow-up with him in 1 month.

## 2017-07-11 ENCOUNTER — Encounter: Payer: Self-pay | Admitting: Internal Medicine

## 2017-07-11 DIAGNOSIS — G4719 Other hypersomnia: Secondary | ICD-10-CM

## 2017-07-11 DIAGNOSIS — G4733 Obstructive sleep apnea (adult) (pediatric): Secondary | ICD-10-CM

## 2017-07-12 DIAGNOSIS — G4733 Obstructive sleep apnea (adult) (pediatric): Secondary | ICD-10-CM | POA: Diagnosis not present

## 2017-07-16 ENCOUNTER — Telehealth: Payer: Self-pay | Admitting: *Deleted

## 2017-07-16 DIAGNOSIS — G4733 Obstructive sleep apnea (adult) (pediatric): Secondary | ICD-10-CM

## 2017-07-16 NOTE — Telephone Encounter (Signed)
Positive OSA Auto-Cpap pressure range of 5-20 cm H20. Patient aware of results. Orders placed. Nothing further needed.

## 2017-07-24 ENCOUNTER — Ambulatory Visit (INDEPENDENT_AMBULATORY_CARE_PROVIDER_SITE_OTHER): Payer: BC Managed Care – PPO | Admitting: Podiatry

## 2017-07-24 ENCOUNTER — Ambulatory Visit (INDEPENDENT_AMBULATORY_CARE_PROVIDER_SITE_OTHER): Payer: BC Managed Care – PPO

## 2017-07-24 ENCOUNTER — Encounter: Payer: Self-pay | Admitting: Podiatry

## 2017-07-24 DIAGNOSIS — M84375D Stress fracture, left foot, subsequent encounter for fracture with routine healing: Secondary | ICD-10-CM

## 2017-07-24 NOTE — Progress Notes (Signed)
He presents today for follow-up of his painful fracture fourth and fifth met base left foot. He states that he's approximately 50-60% improved. Presents today utilizing his cam walker which has broken down.  Objective: Vital signs are stable alert and oriented 3. Pulses are palpable. Cam Walker his heel repair. He has much decrease in edema no erythema cellulitis drainage over overlying the fifth metatarsal cuboid articulation. Radiographs taken today do demonstrate what appears to be a small intra-articular avulsion fracture of the base of the fourth metatarsal exactly where his painful area is. I do believe that this was more than likely a rotation injury that resulted in a stress fracture or avulsion fracture.  Assessment: Well-healing fracture.  Plan: Dispensed a new Cam Walker today and no charge and will follow up with him in 4 weeks or so at which time he will also see Liliane Channel for a new set of orthotics.

## 2017-08-08 ENCOUNTER — Other Ambulatory Visit: Payer: Self-pay | Admitting: Family Medicine

## 2017-08-08 NOTE — Telephone Encounter (Signed)
Last Cr and K+ reviewed; Rx approved 

## 2017-08-14 ENCOUNTER — Ambulatory Visit: Payer: BC Managed Care – PPO

## 2017-08-14 ENCOUNTER — Encounter: Payer: Self-pay | Admitting: Podiatry

## 2017-08-14 ENCOUNTER — Ambulatory Visit: Payer: BC Managed Care – PPO | Admitting: Orthotics

## 2017-08-14 ENCOUNTER — Ambulatory Visit (INDEPENDENT_AMBULATORY_CARE_PROVIDER_SITE_OTHER): Payer: BC Managed Care – PPO | Admitting: Podiatry

## 2017-08-14 DIAGNOSIS — M84375D Stress fracture, left foot, subsequent encounter for fracture with routine healing: Secondary | ICD-10-CM

## 2017-08-14 DIAGNOSIS — M722 Plantar fascial fibromatosis: Secondary | ICD-10-CM

## 2017-08-14 DIAGNOSIS — M779 Enthesopathy, unspecified: Secondary | ICD-10-CM

## 2017-08-14 DIAGNOSIS — M84375A Stress fracture, left foot, initial encounter for fracture: Secondary | ICD-10-CM

## 2017-08-14 NOTE — Progress Notes (Signed)
Patient came into today to be cast for Custom Foot Orthotics. Upon recommendation of Dr. Milinda Pointer Patient presents with 4/5th fx met base, and 2nd met head pain Goals are offloading met base and 2 met head left Plan vendor Richy to fab

## 2017-08-14 NOTE — Progress Notes (Signed)
She presents today for follow-up fracture fourth and fifth metatarsal bases of the left foot. He states this seems to be doing okay and I was just scan for orthotics.  Objective: Vital signs are stable he is alert and oriented 3. There is no pain on palpation to the fourth and fifth metatarsal areas. Palpation does demonstrate a ridge where the fracture sites were. He states that he feels great when he is walking standing so this point no need to do an x-ray.  Assessment: Healing fractures.  Plan: Call him when his orthotics commence he can pick those up. He does not appointment he has had orthotics in the past.

## 2017-08-21 ENCOUNTER — Telehealth: Payer: Self-pay

## 2017-08-21 ENCOUNTER — Other Ambulatory Visit: Payer: Self-pay

## 2017-08-21 DIAGNOSIS — E1165 Type 2 diabetes mellitus with hyperglycemia: Secondary | ICD-10-CM

## 2017-08-21 DIAGNOSIS — Z5181 Encounter for therapeutic drug level monitoring: Secondary | ICD-10-CM

## 2017-08-21 DIAGNOSIS — E785 Hyperlipidemia, unspecified: Secondary | ICD-10-CM

## 2017-08-21 NOTE — Telephone Encounter (Signed)
Certainly Make sure he does not come in for labs too early (needs to be 3 months after the last A1c) He does not need another ferritin or ceruloplasmin

## 2017-08-21 NOTE — Telephone Encounter (Signed)
Copied from CRM [id number]. Topic: Appointment Scheduling - Scheduling Inquiry for Clinic >> Aug 20, 2017  1:32 PM Mare LoanBurton, Donna F wrote: Reason for CRM: pt has an appt on  08/29/17 for 3 month checkup and would like to have lab order put in for his labs so he can come in early to get blood and discuss at visit   Best number [phone]   Pt notified

## 2017-08-28 LAB — COMPLETE METABOLIC PANEL WITH GFR
AG Ratio: 2.2 (calc) (ref 1.0–2.5)
ALT: 17 U/L (ref 9–46)
AST: 18 U/L (ref 10–35)
Albumin: 4.3 g/dL (ref 3.6–5.1)
Alkaline phosphatase (APISO): 34 U/L — ABNORMAL LOW (ref 40–115)
BUN: 15 mg/dL (ref 7–25)
CO2: 27 mmol/L (ref 20–32)
Calcium: 9 mg/dL (ref 8.6–10.3)
Chloride: 103 mmol/L (ref 98–110)
Creat: 0.92 mg/dL (ref 0.70–1.33)
GFR, Est African American: 111 mL/min/{1.73_m2} (ref >=60)
GFR, Est Non African American: 96 mL/min/{1.73_m2} (ref >=60)
Globulin: 2 g/dL (calc) (ref 1.9–3.7)
Glucose, Bld: 183 mg/dL — ABNORMAL HIGH (ref 65–99)
Potassium: 4.3 mmol/L (ref 3.5–5.3)
Sodium: 138 mmol/L (ref 135–146)
Total Bilirubin: 0.7 mg/dL (ref 0.2–1.2)
Total Protein: 6.3 g/dL (ref 6.1–8.1)

## 2017-08-28 LAB — LIPID PANEL
Cholesterol: 127 mg/dL (ref <200)
HDL: 23 mg/dL — ABNORMAL LOW (ref >40)
LDL Cholesterol (Calc): 79 mg/dL (calc)
Non-HDL Cholesterol (Calc): 104 mg/dL (calc) (ref <130)
Total CHOL/HDL Ratio: 5.5 (calc) — ABNORMAL HIGH (ref <5.0)
Triglycerides: 145 mg/dL (ref <150)

## 2017-08-28 LAB — MICROALBUMIN / CREATININE URINE RATIO
Creatinine, Urine: 139 mg/dL (ref 20–320)
Microalb Creat Ratio: 5 mcg/mg creat (ref <30)
Microalb, Ur: 0.7 mg/dL

## 2017-08-28 LAB — HEMOGLOBIN A1C
Hgb A1c MFr Bld: 6.8 % of total Hgb — ABNORMAL HIGH (ref <5.7)
Mean Plasma Glucose: 148 (calc)
eAG (mmol/L): 8.2 (calc)

## 2017-08-29 ENCOUNTER — Encounter: Payer: Self-pay | Admitting: Family Medicine

## 2017-08-29 ENCOUNTER — Ambulatory Visit: Payer: BC Managed Care – PPO | Admitting: Family Medicine

## 2017-08-29 VITALS — BP 142/68 | HR 76 | Temp 98.3°F | Resp 14 | Wt 242.2 lb

## 2017-08-29 DIAGNOSIS — E786 Lipoprotein deficiency: Secondary | ICD-10-CM

## 2017-08-29 DIAGNOSIS — E6609 Other obesity due to excess calories: Secondary | ICD-10-CM

## 2017-08-29 DIAGNOSIS — I10 Essential (primary) hypertension: Secondary | ICD-10-CM

## 2017-08-29 DIAGNOSIS — E785 Hyperlipidemia, unspecified: Secondary | ICD-10-CM

## 2017-08-29 DIAGNOSIS — E1165 Type 2 diabetes mellitus with hyperglycemia: Secondary | ICD-10-CM

## 2017-08-29 DIAGNOSIS — J0100 Acute maxillary sinusitis, unspecified: Secondary | ICD-10-CM

## 2017-08-29 DIAGNOSIS — Z6832 Body mass index (BMI) 32.0-32.9, adult: Secondary | ICD-10-CM

## 2017-08-29 MED ORDER — AMOXICILLIN-POT CLAVULANATE 875-125 MG PO TABS: 1.0000 | ORAL_TABLET | Freq: Two times a day (BID) | ORAL | 0 refills | Status: AC

## 2017-08-29 NOTE — Patient Instructions (Addendum)
Try to follow the DASH guidelines (DASH stands for Dietary Approaches to Stop Hypertension). Try to limit the sodium in your diet to no more than 1,500mg  of sodium per day. Certainly try to not exceed 2,000 mg per day at the very most. Do not add salt when cooking or at the table.  Check the sodium amount on labels when shopping, and choose items lower in sodium when given a choice. Avoid or limit foods that already contain a lot of sodium. Eat a diet rich in fruits and vegetables and whole grains, and try to lose weight if overweight or obese  Let's shoot for a weight of 220 pounds over the next 11 to 22 weeks (that's 1-2 pounds a week)  DASH Eating Plan DASH stands for "Dietary Approaches to Stop Hypertension." The DASH eating plan is a healthy eating plan that has been shown to reduce high blood pressure (hypertension). It may also reduce your risk for type 2 diabetes, heart disease, and stroke. The DASH eating plan may also help with weight loss. What are tips for following this plan? General guidelines  Avoid eating more than 2,300 mg (milligrams) of salt (sodium) a day. If you have hypertension, you may need to reduce your sodium intake to 1,500 mg a day.  Limit alcohol intake to no more than 1 drink a day for nonpregnant women and 2 drinks a day for men. One drink equals 12 oz of beer, 5 oz of wine, or 1 oz of hard liquor.  Work with your health care provider to maintain a healthy body weight or to lose weight. Ask what an ideal weight is for you.  Get at least 30 minutes of exercise that causes your heart to beat faster (aerobic exercise) most days of the week. Activities may include walking, swimming, or biking.  Work with your health care provider or diet and nutrition specialist (dietitian) to adjust your eating plan to your individual calorie needs. Reading food labels  Check food labels for the amount of sodium per serving. Choose foods with less than 5 percent of the Daily Value of  sodium. Generally, foods with less than 300 mg of sodium per serving fit into this eating plan.  To find whole grains, look for the word "whole" as the first word in the ingredient list. Shopping  Buy products labeled as "low-sodium" or "no salt added."  Buy fresh foods. Avoid canned foods and premade or frozen meals. Cooking  Avoid adding salt when cooking. Use salt-free seasonings or herbs instead of table salt or sea salt. Check with your health care provider or pharmacist before using salt substitutes.  Do not fry foods. Cook foods using healthy methods such as baking, boiling, grilling, and broiling instead.  Cook with heart-healthy oils, such as olive, canola, soybean, or sunflower oil. Meal planning   Eat a balanced diet that includes: ? 5 or more servings of fruits and vegetables each day. At each meal, try to fill half of your plate with fruits and vegetables. ? Up to 6-8 servings of whole grains each day. ? Less than 6 oz of lean meat, poultry, or fish each day. A 3-oz serving of meat is about the same size as a deck of cards. One egg equals 1 oz. ? 2 servings of low-fat dairy each day. ? A serving of nuts, seeds, or beans 5 times each week. ? Heart-healthy fats. Healthy fats called Omega-3 fatty acids are found in foods such as flaxseeds and coldwater fish, like  sardines, salmon, and mackerel.  Limit how much you eat of the following: ? Canned or prepackaged foods. ? Food that is high in trans fat, such as fried foods. ? Food that is high in saturated fat, such as fatty meat. ? Sweets, desserts, sugary drinks, and other foods with added sugar. ? Full-fat dairy products.  Do not salt foods before eating.  Try to eat at least 2 vegetarian meals each week.  Eat more home-cooked food and less restaurant, buffet, and fast food.  When eating at a restaurant, ask that your food be prepared with less salt or no salt, if possible. What foods are recommended? The items  listed may not be a complete list. Talk with your dietitian about what dietary choices are best for you. Grains Whole-grain or whole-wheat bread. Whole-grain or whole-wheat pasta. Brown rice. Orpah Cobbatmeal. Quinoa. Bulgur. Whole-grain and low-sodium cereals. Pita bread. Low-fat, low-sodium crackers. Whole-wheat flour tortillas. Vegetables Fresh or frozen vegetables (raw, steamed, roasted, or grilled). Low-sodium or reduced-sodium tomato and vegetable juice. Low-sodium or reduced-sodium tomato sauce and tomato paste. Low-sodium or reduced-sodium canned vegetables. Fruits All fresh, dried, or frozen fruit. Canned fruit in natural juice (without added sugar). Meat and other protein foods Skinless chicken or Malawiturkey. Ground chicken or Mal[place]iturkey. Pork with fat trimmed off. Fish and seafood. Egg whites. Dried beans, peas, or lentils. Unsalted nuts, nut butters, and seeds. Unsalted canned beans. Lean cuts of beef with fat trimmed off. Low-sodium, lean deli meat. Dairy Low-fat (1%) or fat-free (skim) milk. Fat-free, low-fat, or reduced-fat cheeses. Nonfat, low-sodium ricotta or cottage cheese. Low-fat or nonfat yogurt. Low-fat, low-sodium cheese. Fats and oils Soft margarine without trans fats. Vegetable oil. Low-fat, reduced-fat, or light mayonnaise and salad dressings (reduced-sodium). Canola, safflower, olive, soybean, and sunflower oils. Avocado. Seasoning and other foods Herbs. Spices. Seasoning mixes without salt. Unsalted popcorn and pretzels. Fat-free sweets. What foods are not recommended? The items listed may not be a complete list. Talk with your dietitian about what dietary choices are best for you. Grains Baked goods made with fat, such as croissants, muffins, or some breads. Dry pasta or rice meal packs. Vegetables Creamed or fried vegetables. Vegetables in a cheese sauce. Regular canned vegetables (not low-sodium or reduced-sodium). Regular canned tomato sauce and paste (not low-sodium or  reduced-sodium). Regular tomato and vegetable juice (not low-sodium or reduced-sodium). Rosita FirePickles. Olives. Fruits Canned fruit in a light or heavy syrup. Fried fruit. Fruit in cream or butter sauce. Meat and other protein foods Fatty cuts of meat. Ribs. Fried meat. Tomasa BlaseBacon. Sausage. Bologna and other processed lunch meats. Salami. Fatback. Hotdogs. Bratwurst. Salted nuts and seeds. Canned beans with added salt. Canned or smoked fish. Whole eggs or egg yolks. Chicken or Malawiturkey with skin. Dairy Whole or 2% milk, cream, and half-and-half. Whole or full-fat cream cheese. Whole-fat or sweetened yogurt. Full-fat cheese. Nondairy creamers. Whipped toppings. Processed cheese and cheese spreads. Fats and oils Butter. Stick margarine. Lard. Shortening. Ghee. Bacon fat. Tropical oils, such as coconut, palm kernel, or palm oil. Seasoning and other foods Salted popcorn and pretzels. Onion salt, garlic salt, seasoned salt, table salt, and sea salt. Worcestershire sauce. Tartar sauce. Barbecue sauce. Teriyaki sauce. Soy sauce, including reduced-sodium. Steak sauce. Canned and packaged gravies. Fish sauce. Oyster sauce. Cocktail sauce. Horseradish that you find on the shelf. Ketchup. Mustard. Meat flavorings and tenderizers. Bouillon cubes. Hot sauce and Tabasco sauce. Premade or packaged marinades. Premade or packaged taco seasonings. Relishes. Regular salad dressings. Where to find more information:  National Heart, Lung, and Blood Institute: PopSteam.iswww.nhlbi.nih.gov  American Heart Association: www.heart.org Summary  The DASH eating plan is a healthy eating plan that has been shown to reduce high blood pressure (hypertension). It may also reduce your risk for type 2 diabetes, heart disease, and stroke.  With the DASH eating plan, you should limit salt (sodium) intake to 2,300 mg a day. If you have hypertension, you may need to reduce your sodium intake to 1,500 mg a day.  When on the DASH eating plan, aim to eat more  fresh fruits and vegetables, whole grains, lean proteins, low-fat dairy, and heart-healthy fats.  Work with your health care provider or diet and nutrition specialist (dietitian) to adjust your eating plan to your individual calorie needs. This information is not intended to replace advice given to you by your health care provider. Make sure you discuss any questions you have with your health care provider. Document Released: 09/06/2011 Document Revised: 09/10/2016 Document Reviewed: 09/10/2016 Elsevier Interactive Patient Education  2017 [employer]

## 2017-08-29 NOTE — Progress Notes (Signed)
BP (!) 142/68   Pulse 76   Temp 98.3 F (36.8 C) (Oral)   Resp 14   Wt 242 lb 3.2 oz (109.9 kg)   SpO2 97%   BMI 32.85 kg/m    Subjective:    Patient ID: Ryan Ritter, male    DOB: Mar 18, 1966, 51 y.o.   MRN: [id number]  HPI: Ryan Ritter is a 51 y.o. male  Chief Complaint  Patient presents with  . Follow-up    HPI Patient is here for f/u of chronic issues Type 2 DM; A1c really improved He expects even better numbers FSBS was 108 three hours after supper; 120 fasting this morning Eye exam next week Had fracture 4th and 5th metatarsal LEFT foot; doesn't hurt any more He has quit the Jardiance on his own  High TG; he really brought this down Low HDL; he expects this come up too He tried niacin and did not help numbers back in the day No men with heart attacks in their 50's Taking aspirin now; in the last two months, he has been reading about this Concerned about liver and kidney damage He wants to stop the crestor; thinks maybe memory is being affected; also leg discomfort He'll continue   He has been doing keto diet for four weeks prior to lab draw He has lost 22.2 pounds since starting the diet He is expecting his sugars and TG to come down and hopes to come off of medicines  He cut his losartan in half because taking extra electrolytes and he heard that he needed to be careful; he had already been taking the electrolytes when last labs were done  [facility] 08/29/2017 05/29/2017 02/26/2017 11/08/2016 07/27/2016  Decreased Interest 0 0 0 0 0  Down, Depressed, Hopeless 0 0 0 0 0  PHQ - 2 Score 0 0 0 0 0   Relevant past medical, surgical, family and social history reviewed Past Medical History:  Diagnosis Date  . Allergy   . Diabetes mellitus without complication (HCC)   . Hyperlipidemia   . Obesity 03/14/2015  . OSA on CPAP 05/29/2017  . Sleep apnea    Past Surgical History:  Procedure Laterality Date  . tendinitis Left   . vascectomy  08/2014     Family History  Problem Relation Age of Onset  . Diabetes Father   . Pancreatic cancer Father   . Diabetes Brother   . Diabetes Maternal Grandmother   . Breast cancer Mother   . Colon polyps Mother    Social History   Tobacco Use  . Smoking status: Never Smoker  . Smokeless tobacco: Former NeurosurgeonUser    Types: Chew  Substance Use Topics  . Alcohol use: No    Alcohol/week: 0.0 oz  . Drug use: No   Interim medical history since last visit reviewed. Allergies and medications reviewed  Review of Systems  HENT: Positive for rhinorrhea and sinus pressure. Negative for ear pain and sore throat.    Per HPI unless specifically indicated above     Objective:    BP (!) 142/68   Pulse 76   Temp 98.3 F (36.8 C) (Oral)   Resp 14   Wt 242 lb 3.2 oz (109.9 kg)   SpO2 97%   BMI 32.85 kg/m   Wt Readings from Last 3 Encounters:  08/29/17 242 lb 3.2 oz (109.9 kg)  07/01/17 257 lb (116.6 kg)  05/29/17 256 lb 1.6 oz (116.2 kg)  Physical Exam  Constitutional: He appears well-developed and well-nourished.  Weight loss of 14-15 pounds since last visit  HENT:  Right Ear: Hearing and ear canal normal.  Left Ear: Hearing and ear canal normal.  Nose: Mucosal edema and rhinorrhea present.  Mouth/Throat: Oropharynx is clear and moist and mucous membranes are normal. No posterior oropharyngeal edema or posterior oropharyngeal erythema.  Cloudy purulent drainage in nares  Eyes: No scleral icterus.  Cardiovascular: Normal rate and regular rhythm.  No extrasystoles are present.  Pulmonary/Chest: Effort normal and breath sounds normal. No respiratory distress.  Abdominal: Soft. Normal appearance and bowel sounds are normal. He exhibits no distension. There is no hepatosplenomegaly.  Musculoskeletal: He exhibits no edema.  Lymphadenopathy:    He has no cervical adenopathy.  Neurological: He is alert.  Skin: No rash noted. He is not diaphoretic. No pallor.  Psychiatric: He has a normal mood  and affect. His speech is normal. His mood appears not anxious. Cognition and memory are normal. He does not exhibit a depressed mood.   Diabetic Foot Form - Detailed   Diabetic Foot Exam - detailed Diabetic Foot exam was performed with the following findings:  Yes 08/29/2017  4:54 PM  Visual Foot Exam completed.:  Yes  Pulse Foot Exam completed.:  Yes  Right Dorsalis Pedis:  Present Left Dorsalis Pedis:  Present  Sensory Foot Exam Completed.:  Yes Semmes-Weinstein Monofilament Test R Site 1-Great Toe:  Pos L Site 1-Great Toe:  Pos       Results for orders placed or performed in visit on 08/21/17  COMPLETE METABOLIC PANEL WITH GFR  Result Value Ref Range   Glucose, Bld 183 (H) 65 - 99 mg/dL   BUN 15 7 - 25 mg/dL   Creat 1.610.92 0.960.70 - 0.451.33 mg/dL   GFR, Est Non African American 96 > OR = 60 mL/min/1.6273m2   GFR, Est African American 111 > OR = 60 mL/min/1.6073m2   BUN/Creatinine Ratio NOT APPLICABLE 6 - 22 (calc)   Sodium 138 135 - 146 mmol/L   Potassium 4.3 3.5 - 5.3 mmol/L   Chloride 103 98 - 110 mmol/L   CO2 27 20 - 32 mmol/L   Calcium 9.0 8.6 - 10.3 mg/dL   Total Protein 6.3 6.1 - 8.1 g/dL   Albumin 4.3 3.6 - 5.1 g/dL   Globulin 2.0 1.9 - 3.7 g/dL (calc)   AG Ratio 2.2 1.0 - 2.5 (calc)   Total Bilirubin 0.7 0.2 - 1.2 mg/dL   Alkaline phosphatase (APISO) 34 (L) 40 - 115 U/L   AST 18 10 - 35 U/L   ALT 17 9 - 46 U/L  Hemoglobin A1c  Result Value Ref Range   Hgb A1c MFr Bld 6.8 (H) <5.7 % of total Hgb   Mean Plasma Glucose 148 (calc)   eAG (mmol/L) 8.2 (calc)  Lipid panel  Result Value Ref Range   Cholesterol 127 <200 mg/dL   HDL 23 (L) >40>40 mg/dL   Triglycerides 981145 <191<150 mg/dL   LDL Cholesterol (Calc) 79 mg/dL (calc)   Total CHOL/HDL Ratio 5.5 (H) <5.0 (calc)   Non-HDL Cholesterol (Calc) 104 <130 mg/dL (calc)  Microalbumin / creatinine urine ratio  Result Value Ref Range   Creatinine, Urine 139 20 - 320 mg/dL   Microalb, Ur 0.7 mg/dL   Microalb Creat Ratio 5 <30 mcg/mg  creat      Assessment & Plan:   Problem List Items Addressed This Visit      Cardiovascular and Mediastinum  Benign essential HTN    Not quite to goal today; patient wishes to bring pressure down on his own with diet and weight loss; he decreased his ARB on his own      Relevant Medications   losartan (COZAAR) 50 MG tablet     Endocrine   Diabetes mellitus, type 2 (HCC)    Praise given for weight loss; foot exam by MD; patient will continue to work on weight, diet, activity      Relevant Medications   glimepiride (AMARYL) 4 MG tablet   losartan (COZAAR) 50 MG tablet     Other   Obesity    Reviewed BMI calculator; healthy weight loss encouraged      Relevant Medications   glimepiride (AMARYL) 4 MG tablet   Low HDL (under 40)    Weight loss ideal for helping with the low HDL      Hyperlipidemia LDL goal <100    Patient wishes to try his diet; will recheck labs in 3 months; work on weight loss      Relevant Medications   losartan (COZAAR) 50 MG tablet    Other Visit Diagnoses    Acute non-recurrent maxillary sinusitis    -  Primary   Relevant Medications   amoxicillin-clavulanate (AUGMENTIN) 875-125 MG tablet       Follow up plan: Return in about 3 months (around 11/28/2017) for follow-up visit with Dr. Sherie Don, with ffasting labs 2-3 days prior to the visit.  An after-visit summary was printed and given to the patient at check-out.  Please see the patient instructions which may contain other information and recommendations beyond what is mentioned above in the assessment and plan.  Meds ordered this encounter  Medications  . amoxicillin-clavulanate (AUGMENTIN) 875-125 MG tablet    Sig: Take 1 tablet by mouth 2 (two) times daily for 10 days.    Dispense:  20 tablet    Refill:  0    No orders of the defined types were placed in this encounter.

## 2017-09-02 NOTE — Assessment & Plan Note (Signed)
Patient wishes to try his diet; will recheck labs in 3 months; work on weight loss

## 2017-09-02 NOTE — Assessment & Plan Note (Signed)
Not quite to goal today; patient wishes to bring pressure down on his own with diet and weight loss; he decreased his ARB on his own

## 2017-09-02 NOTE — Assessment & Plan Note (Signed)
Weight loss ideal for helping with the low HDL

## 2017-09-02 NOTE — Assessment & Plan Note (Signed)
Praise given for weight loss; foot exam by MD; patient will continue to work on weight, diet, activity

## 2017-09-02 NOTE — Assessment & Plan Note (Signed)
Reviewed BMI calculator; healthy weight loss encouraged

## 2017-09-04 ENCOUNTER — Encounter: Payer: Self-pay | Admitting: Internal Medicine

## 2017-09-04 NOTE — Progress Notes (Signed)
[facility]      Assessment and Plan:  51 year old male with obstructive sleep apnea.  Obstructive sleep apnea. -Continue using cpap every night.  --Residual AHI of 4.  --Adjust CPAP auto pressure to 7-14 cm H2O.   Diabetes mellitus. -Sleep apnea can contribute to elevated blood glucose levels, therefore, treatment of sleep apnea. His needed to adequately control the patient's diabetes mellitus.  No orders of the defined types were placed in this encounter.  No Follow-up on file.   Date: 09/04/2017  MRN# [id number] Ryan Ritter 04-13-1966   Ryan Ritter is a 51 y.o. old male seen in consultation for chief complaint of:    Chief Complaint  Patient presents with  . Sleep Apnea    doing well on CPAP    HPI:  The patient is a 51 year old male, she is a history of obstructive sleep apnea but was no longer using CPAP.  Per insurance guidelines she was sent for a new sleep study in order to requalify for CPAP.  Her repeat home sleep study showed obstructive sleep apnea with an apnea hypotony index of 11, consistent with mild sleep apnea, it was moderate in the supine position.  She was hence started on auto CPAP with pressure range of 5-20.  He feels that he has been doing well with his machine. He feels that he doing better than when he started using it, and he is not snoring. He is using it for the entire night.   **Review of download data 30 days as of 09/04/17: Average usage is 23/30, however he notes that the machine is dinging him for the nights before he started using it..  Average usage on days used 6 hours 43 minutes.  Auto 5-20.  Median pressure 7, 95th percentile pressure is 9, maximum pressure is 10.  Residual AHI is 4.2.  Medication:    Current Outpatient Medications:  .  Alpha-Lipoic Acid 200 MG CAPS, Take 200 mg by mouth 2 (two) times daily., Disp: , Rfl:  .  amoxicillin-clavulanate (AUGMENTIN) 875-125 MG tablet, Take 1 tablet by  mouth 2 (two) times daily for 10 days., Disp: 20 tablet, Rfl: 0 .  aspirin 81 MG tablet, Take 81 mg by mouth daily., Disp: , Rfl:  .  CALCIUM-MAGNESIUM-ZINC PO, Take 1 tablet by mouth daily., Disp: , Rfl:  .  Chromium 200 MCG CAPS, Take 200 mcg by mouth 3 (three) times daily., Disp: , Rfl:  .  Cinnamon 500 MG TABS, Take 1,000 tablets by mouth 2 (two) times daily., Disp: , Rfl:  .  Coconut Oil 1000 MG CAPS, Take 2,000 mg by mouth 2 (two) times daily., Disp: , Rfl:  .  Coenzyme Q10 (CO Q10) 200 MG CAPS, Take 200 mg by mouth daily., Disp: , Rfl:  .  doxycycline (MONODOX) 50 MG capsule, Take 1 capsule (50 mg total) by mouth daily., Disp: 30 capsule, Rfl: 11 .  fluticasone (FLONASE) 50 MCG/ACT nasal spray, Place 2 sprays into both nostrils daily., Disp: , Rfl:  .  glimepiride (AMARYL) 4 MG tablet, Take 0.5 tablets (2 mg total) by mouth daily with breakfast. Stop completely if blood sugars low, Disp: , Rfl:  .  Icosapent Ethyl (VASCEPA) 1 g CAPS, Take 2 capsules by mouth 2 (two) times daily., Disp: 120 capsule, Rfl: 11 .  levocetirizine (XYZAL) 5 MG tablet, Take 5 mg by mouth every evening. , Disp: , Rfl:  .  losartan (COZAAR) 50 MG tablet, Take 0.5  tablets (25 mg total) by mouth daily., Disp: , Rfl:  .  meloxicam (MOBIC) 15 MG tablet, Take 15 mg by mouth as needed., Disp: , Rfl:  .  POTASSIUM GLUCONATE PO, Take 1 tablet by mouth daily., Disp: , Rfl:  .  sitaGLIPtin-metformin (JANUMET) 50-1000 MG tablet, Take 1 tablet by mouth 2 (two) times daily with a meal., Disp: 180 tablet, Rfl: 3 .  Turmeric 500 MG CAPS, Take 500 mg by mouth daily., Disp: , Rfl:    Allergies:  Benicar [olmesartan] and Lisinopril  Review of Systems: Gen:  Denies  fever, sweats, chills HEENT: Denies blurred vision, double vision. bleeds, sore throat Cvc:  No dizziness, chest pain. Resp:   Denies cough or sputum production, shortness of breath Gi: Denies swallowing difficulty, stomach pain. Gu:  Denies bladder incontinence,  burning urine Ext:   No Joint pain, stiffness. Skin: No skin rash,  hives  Endoc:  No polyuria, polydipsia. Psych: No depression, insomnia. Other:  All other systems were reviewed with the patient and were negative other that what is mentioned in the HPI.   Physical Examination:   VS: BP 126/78 (BP Location: Left Arm, Cuff Size: Normal)   Pulse 85   Ht 6' (1.829 m)   Wt 244 lb (110.7 kg)   SpO2 96%   BMI 33.09 kg/m   General Appearance: No distress  Neuro:without focal findings,  speech normal,  HEENT: PERRLA, EOM intact.  Mallampati 3 Pulmonary: normal breath sounds, No wheezing.  CardiovascularNormal S1,S2.  No m/r/g.   Abdomen: Benign, Soft, non-tender. Renal:  No costovertebral tenderness  GU:  No performed at this time. Endoc: No evident thyromegaly, no signs of acromegaly. Skin:   warm, no rashes, no ecchymosis  Extremities: normal, no cyanosis, clubbing.  Other findings:    LABORATORY PANEL:   CBC No results for input(s): WBC, HGB, HCT, PLT in the last 168 hours. ------------------------------------------------------------------------------------------------------------------  Chemistries  No results for input(s): NA, K, CL, CO2, GLUCOSE, BUN, CREATININE, CALCIUM, MG, AST, ALT, ALKPHOS, BILITOT in the last 168 hours.  Invalid input(s): GFRCGP ------------------------------------------------------------------------------------------------------------------  Cardiac Enzymes No results for input(s): TROPONINI in the last 168 hours. ------------------------------------------------------------  RADIOLOGY:  No results found.     Thank  you for the consultation and for allowing [facility] to assist in the care of your patient. Our recommendations are noted above.  Please contact us if we can be of further service.   Wells Guileseep Ramachandran, MD.  Board Certified in Internal Medicine, Pulmonary Medicine, Critical Care Medicine, and Sleep  Medicine.  [facility] Office Number: [phone]  Santiago Gladavid Kasa, M.D.  Billy Fischeravid Simonds, M.D  09/04/2017

## 2017-09-05 ENCOUNTER — Ambulatory Visit: Payer: BC Managed Care – PPO | Admitting: Internal Medicine

## 2017-09-05 ENCOUNTER — Encounter: Payer: Self-pay | Admitting: Internal Medicine

## 2017-09-05 VITALS — BP 126/78 | HR 85 | Ht 72.0 in | Wt 244.0 lb

## 2017-09-05 DIAGNOSIS — G4733 Obstructive sleep apnea (adult) (pediatric): Secondary | ICD-10-CM | POA: Diagnosis not present

## 2017-09-05 DIAGNOSIS — S93409A Sprain of unspecified ligament of unspecified ankle, initial encounter: Secondary | ICD-10-CM

## 2017-09-05 NOTE — Addendum Note (Signed)
Addended by: Janean SarkSNIPES, SONYA K on: 09/05/2017 03:57 PM   Modules accepted: Orders

## 2017-09-05 NOTE — Patient Instructions (Signed)
Have a nice holiday! Adjust CPAP auto pressure to 7-14 cm H2O.   Continue to use your cpap every night.

## 2017-09-06 ENCOUNTER — Encounter: Payer: Self-pay | Admitting: Family Medicine

## 2017-09-11 ENCOUNTER — Encounter: Payer: BC Managed Care – PPO | Admitting: Podiatry

## 2017-11-26 ENCOUNTER — Ambulatory Visit: Payer: BC Managed Care – PPO | Admitting: Family Medicine

## 2017-12-02 ENCOUNTER — Ambulatory Visit: Payer: BC Managed Care – PPO | Admitting: Family Medicine

## 2017-12-06 ENCOUNTER — Encounter: Payer: Self-pay | Admitting: Family Medicine

## 2017-12-06 ENCOUNTER — Ambulatory Visit: Payer: BC Managed Care – PPO | Admitting: Family Medicine

## 2017-12-06 VITALS — BP 136/68 | HR 97 | Temp 98.8°F | Resp 14 | Wt 238.7 lb

## 2017-12-06 DIAGNOSIS — E1165 Type 2 diabetes mellitus with hyperglycemia: Secondary | ICD-10-CM

## 2017-12-06 DIAGNOSIS — J0101 Acute recurrent maxillary sinusitis: Secondary | ICD-10-CM | POA: Diagnosis not present

## 2017-12-06 MED ORDER — AMOXICILLIN-POT CLAVULANATE 875-125 MG PO TABS: 1.0000 | ORAL_TABLET | Freq: Two times a day (BID) | ORAL | 0 refills | Status: DC

## 2017-12-06 NOTE — Progress Notes (Signed)
BP 136/68   Pulse 97   Temp 98.8 F (37.1 C) (Oral)   Resp 14   Wt 238 lb 11.2 oz (108.3 kg)   SpO2 97%   BMI 32.37 kg/m    Subjective:    Patient ID: Ryan Ritter, male    DOB: July 10, 1966, 52 y.o.   MRN: [id number]  HPI: Ryan Ritter is a 52 y.o. male  Chief Complaint  Patient presents with  . Sinusitis    onset 1 week.  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 Headache, nasal congestion, right side is really bad, affecting his use of CPAP; can change sides; drainage, green and yellow and back again starting to go in the chest garling salt water; flonase  Type 2 diabetes; doing the keto diet; walked the dog and checked the FSBS and it was 69 and ate a little something and it was 109 when he left the house  Dep[facility] screen [facility] 12/06/2017 08/29/2017 05/29/2017 02/26/2017 11/08/2016  Decreased Interest 0 0 0 0 0  Down, Depressed, Hopeless 0 0 0 0 0  PHQ - 2 Score 0 0 0 0 0    Relevant past medical, surgical, family and social history reviewed Past Medical History:  Diagnosis Date  . Allergy   . Diabetes mellitus without complication (HCC)   . Hyperlipidemia   . Obesity 03/14/2015  . OSA on CPAP 05/29/2017  . Sleep apnea    Past Surgical History:  Procedure Laterality Date  . tendinitis Left   . vascectomy  08/2014   Family History  Problem Relation Age of Onset  . Diabetes Father   . Pancreatic cancer Father   . Diabetes Brother   . Diabetes Maternal Grandmother   . Breast cancer Mother   . Colon polyps Mother    Social History   Tobacco Use  . Smoking status: Never Smoker  . Smokeless tobacco: Former NeurosurgeonUser    Types: Chew  Substance Use Topics  . Alcohol use: No    Alcohol/week: 0.0 oz  . Drug use: No    Interim medical history since last visit reviewed. Allergies and medications reviewed  Review of Systems Per HPI  unless specifically indicated above     Objective:    BP 136/68   Pulse 97   Temp 98.8 F (37.1 C) (Oral)   Resp 14   Wt 238 lb 11.2 oz (108.3 kg)   SpO2 97%   BMI 32.37 kg/m   Wt Readings from Last 3 Encounters:  12/06/17 238 lb 11.2 oz (108.3 kg)  09/05/17 244 lb (110.7 kg)  08/29/17 242 lb 3.2 oz (109.9 kg)    Physical Exam  Constitutional: He appears well-developed and well-nourished. No distress.  HENT:  Right Ear: Tympanic membrane is not erythematous. No middle ear effusion.  Left Ear: Tympanic membrane is not erythematous.  No middle ear effusion.  Nose: Mucosal edema and rhinorrhea present.  Mouth/Throat: No posterior oropharyngeal edema or posterior oropharyngeal erythema.  Eyes: No scleral icterus.  Cardiovascular: Normal rate and regular rhythm.  Pulmonary/Chest: Effort normal and breath sounds normal.  Neurological: He is alert.  Skin: No pallor.  Psychiatric: He has a normal mood and affect.   Diabetic Foot Form - Detailed   Diabetic Foot Exam - detailed Diabetic Foot exam was performed with the following findings:  Yes 12/06/2017 11:14 AM  Visual Foot Exam completed.:  Yes  Pulse Foot Exam completed.:  Yes  Sensory Foot Exam Completed.:  Yes Semmes-Weinstein Monofilament Test       Results for orders placed or performed in visit on 09/06/17  HM DIABETES EYE EXAM  Result Value Ref Range   HM Diabetic Eye Exam No Retinopathy No Retinopathy      Assessment & Plan:   Problem List Items Addressed This Visit    None    Visit Diagnoses    Acute recurrent maxillary sinusitis    -  Primary   treat with antibiotics; rest, hydration, etc; see AVS; cautioned about risk of C diff   Relevant Medications   amoxicillin-clavulanate (AUGMENTIN) 875-125 MG tablet       Follow up plan: No Follow-up on file.  An after-visit summary was printed and given to the patient at check-out.  Please see the patient instructions which may contain other information and  recommendations beyond what is mentioned above in the assessment and plan.  Meds ordered this encounter  Medications  . amoxicillin-clavulanate (AUGMENTIN) 875-125 MG tablet    Sig: Take 1 tablet by mouth 2 (two) times daily.    Dispense:  20 tablet    Refill:  0    No orders of the defined types were placed in this encounter.

## 2017-12-06 NOTE — Patient Instructions (Addendum)
Start the antibiotics Please do eat yogurt or kimchi or take a probiotic daily for the next month We want to replace the healthy germs in the gut If you notice foul, watery diarrhea in the next two months, schedule an appointment RIGHT AWAY or go to an urgent care or the emergency room if a holiday or over a weekend Rest and hydration  Sinusitis, Adult Sinusitis is soreness and inflammation of your sinuses. Sinuses are hollow spaces in the bones around your face. They are located:  Around your eyes.  In the middle of your forehead.  Behind your nose.  In your cheekbones.  Your sinuses and nasal passages are lined with a stringy fluid (mucus). Mucus normally drains out of your sinuses. When your nasal tissues get inflamed or swollen, the mucus can get trapped or blocked so air cannot flow through your sinuses. This lets bacteria, viruses, and funguses grow, and that leads to infection. Follow these instructions at home: Medicines  Take, use, or apply over-the-counter and prescription medicines only as told by your doctor. These may include nasal sprays.  If you were prescribed an antibiotic medicine, take it as told by your doctor. Do not stop taking the antibiotic even if you start to feel better. Hydrate and Humidify  Drink enough water to keep your pee (urine) clear or pale yellow.  Use a cool mist humidifier to keep the humidity level in your home above 50%.  Breathe in steam for 10-15 minutes, 3-4 times a day or as told by your doctor. You can do this in the bathroom while a hot shower is running.  Try not to spend time in cool or dry air. Rest  Rest as much as possible.  Sleep with your head raised (elevated).  Make sure to get enough sleep each night. General instructions  Put a warm, moist washcloth on your face 3-4 times a day or as told by your doctor. This will help with discomfort.  Wash your hands often with soap and water. If there is no soap and water, use  hand sanitizer.  Do not smoke. Avoid being around people who are smoking (secondhand smoke).  Keep all follow-up visits as told by your doctor. This is important. Contact a doctor if:  You have a fever.  Your symptoms get worse.  Your symptoms do not get better within 10 days. Get help right away if:  You have a very bad headache.  You cannot stop throwing up (vomiting).  You have pain or swelling around your face or eyes.  You have trouble seeing.  You feel confused.  Your neck is stiff.  You have trouble breathing. This information is not intended to replace advice given to you by your health care provider. Make sure you discuss any questions you have with your health care provider. Document Released: 03/05/2008 Document Revised: 05/13/2016 Document Reviewed: 07/13/2015 Elsevier Interactive Patient Education  [employer]2018 [employer]

## 2017-12-15 NOTE — Assessment & Plan Note (Signed)
Trying to eat better, lose weight; foot exam by MD today

## 2017-12-25 ENCOUNTER — Telehealth: Payer: Self-pay | Admitting: Family Medicine

## 2017-12-25 NOTE — Telephone Encounter (Signed)
Copied from CRM [id number]. Topic: Quick Communication - See Telephone Encounter >> Dec 25, 2017  3:40 PM Rudi CocoLathan, Latoya M, NT wrote: CRM for notification. See Telephone encounter for: 12/25/17.  Pt. Calling to see if he can have lab work done on Monday 12-30-17 or Tuesday  12-31-17 being that he has an appt. With Dr. Sherie DonLada on Friday Arpil 5th at 4:00pm [phone]

## 2017-12-26 NOTE — Telephone Encounter (Signed)
He's not due for labs until May 28th

## 2017-12-26 NOTE — Telephone Encounter (Signed)
Pt notified and verbalized understanding.

## 2018-01-03 ENCOUNTER — Encounter: Payer: Self-pay | Admitting: Family Medicine

## 2018-01-03 ENCOUNTER — Ambulatory Visit: Payer: BC Managed Care – PPO | Admitting: Family Medicine

## 2018-01-03 VITALS — BP 126/62 | HR 81 | Temp 97.8°F | Resp 14 | Ht 72.0 in | Wt 222.8 lb

## 2018-01-03 DIAGNOSIS — E6609 Other obesity due to excess calories: Secondary | ICD-10-CM

## 2018-01-03 DIAGNOSIS — E786 Lipoprotein deficiency: Secondary | ICD-10-CM

## 2018-01-03 DIAGNOSIS — Z5181 Encounter for therapeutic drug level monitoring: Secondary | ICD-10-CM | POA: Diagnosis not present

## 2018-01-03 DIAGNOSIS — E119 Type 2 diabetes mellitus without complications: Secondary | ICD-10-CM

## 2018-01-03 DIAGNOSIS — E785 Hyperlipidemia, unspecified: Secondary | ICD-10-CM

## 2018-01-03 DIAGNOSIS — Z6830 Body mass index (BMI) 30.0-30.9, adult: Secondary | ICD-10-CM | POA: Diagnosis not present

## 2018-01-03 DIAGNOSIS — I10 Essential (primary) hypertension: Secondary | ICD-10-CM

## 2018-01-03 NOTE — Progress Notes (Signed)
BP 126/62   Pulse 81   Temp 97.8 F (36.6 C) (Oral)   Resp 14   Ht 6' (1.829 m)   Wt 222 lb 12.8 oz (101.1 kg)   SpO2 96%   BMI 30.22 kg/m    Subjective:    Patient ID: Ryan Ritter, male    DOB: 1966/01/10, 52 y.o.   MRN: [id number]  HPI: Ryan Rawracy L Goering is a 52 y.o. male  Chief Complaint  Patient presents with  . Follow-up    HPI Here for f/u  High BP; not checking BP away from doctor  Type 2 diabetes Urine microalb:Cr normal His diet is much better; cutting out sugars and carbs; checking ketones and blood sugars Checking FSBS 2-3 x a day; highest BS in last week around 130 something; lowest around 107 When he fasts, he doesn't take any of his medicines, including the metformin Even not needing the zyrtec  Lab Results  Component Value Date   HGBA1C 6.8 (H) 08/27/2017   Low HDL; LDL 79  Obesity; he has actually been fasting this week; has had water and green tea and has water with apple cider vinegar at night and cream and tartar; he feels fine; this is his third fast; he says this is for health, not religious; feels fine, "oh yeah"; I told him in late summer that he really needed to lose weight; he heard that if his A1c didn't come down, he might need insulin; he did not want to go on insulin; not craving the snacks any more; does not get as hungry; eating foods that he loves; broccoli and non-starchy veggies; mixed lettuces, not just iceberg lettuce; healthy nuts; still eats meat, but trying to watch how much protein total; he eats grass feed beef; he has really educated himself  [facility] 01/03/2018 12/06/2017 08/29/2017 05/29/2017 02/26/2017  Decreased Interest 0 0 0 0 0  Down, Depressed, Hopeless 0 0 0 0 0  PHQ - 2 Score 0 0 0 0 0    Relevant past medical, surgical, family and social history reviewed Past Medical History:  Diagnosis Date  . Allergy   . Diabetes mellitus without complication (HCC)   . Hyperlipidemia   . Obesity 03/14/2015  . OSA on  CPAP 05/29/2017  . Sleep apnea    Past Surgical History:  Procedure Laterality Date  . tendinitis Left   . vascectomy  08/2014   Family History  Problem Relation Age of Onset  . Diabetes Father   . Pancreatic cancer Father   . Diabetes Brother   . Diabetes Maternal Grandmother   . Breast cancer Mother   . Colon polyps Mother    Social History   Tobacco Use  . Smoking status: Never Smoker  . Smokeless tobacco: Former NeurosurgeonUser    Types: Chew  Substance Use Topics  . Alcohol use: No    Alcohol/week: 0.0 oz  . Drug use: No    Interim medical history since last visit reviewed. Allergies and medications reviewed  Review of Systems Per HPI unless specifically indicated above     Objective:    BP 126/62   Pulse 81   Temp 97.8 F (36.6 C) (Oral)   Resp 14   Ht 6' (1.829 m)   Wt 222 lb 12.8 oz (101.1 kg)   SpO2 96%   BMI 30.22 kg/m   Wt Readings from Last 3 Encounters:  01/03/18 222 lb 12.8 oz (101.1 kg)  12/06/17 238 lb 11.2  oz (108.3 kg)  09/05/17 244 lb (110.7 kg)    Physical Exam  Constitutional: He appears well-developed and well-nourished. No distress.  HENT:  Head: Normocephalic and atraumatic.  Right Ear: Tympanic membrane is not erythematous. No middle ear effusion.  Left Ear: Tympanic membrane is not erythematous.  No middle ear effusion.  Nose: No rhinorrhea.  Mouth/Throat: No posterior oropharyngeal edema or posterior oropharyngeal erythema.  Eyes: EOM are normal. No scleral icterus.  Neck: No thyromegaly present.  Cardiovascular: Normal rate and regular rhythm.  Pulmonary/Chest: Effort normal and breath sounds normal.  Abdominal: Soft. Bowel sounds are normal. He exhibits no distension.  Musculoskeletal: He exhibits no edema.  Neurological: Coordination normal.  Skin: Skin is warm and dry. No pallor.  Psychiatric: He has a normal mood and affect. His behavior is normal. Judgment and thought content normal.   Diabetic Foot Form - Detailed     Diabetic Foot Exam - detailed Diabetic Foot exam was performed with the following findings:  Yes 01/03/2018  2:27 PM  Visual Foot Exam completed.:  Yes  Pulse Foot Exam completed.:  Yes  Sensory Foot Exam Completed.:  Yes Semmes-Weinstein Monofilament Test R Site 1-Great Toe:  Pos L Site 1-Great Toe:  Pos        Results for orders placed or performed in visit on 09/06/17  HM DIABETES EYE EXAM  Result Value Ref Range   HM Diabetic Eye Exam No Retinopathy No Retinopathy      Assessment & Plan:   Problem List Items Addressed This Visit      Cardiovascular and Mediastinum   Benign essential HTN    Well-controlled today; really changed eating habits and losing weight        Endocrine   Diabetes mellitus without complication (HCC) - Primary    Do NOT take metformin if fasting or acidotic, can be fatal I explained; with weight loss, could potentially stop medicine(s)      Relevant Orders   Hemoglobin A1c     Other   Obesity    BMI all the way down to 30.22; he does not have a number of pounds in mind; still has some belly fat to go; his goal is to be able to come off medicine      Low HDL (under 40)    Expect this to improve with the weight loss      Hyperlipidemia LDL goal <100    Check lipids in May 2019      Relevant Orders   Lipid panel   Medication monitoring encounter    Check labs in May      Relevant Orders   COMPLETE METABOLIC PANEL WITH GFR      Follow up plan: Return in about 6 months (around 07/05/2018) for follow-up visit with Dr. Sherie Don.  An after-visit summary was printed and given to the patient at check-out.  Please see the patient instructions which may contain other information and recommendations beyond what is mentioned above in the assessment and plan.

## 2018-01-03 NOTE — Assessment & Plan Note (Signed)
Well-controlled today; really changed eating habits and losing weight

## 2018-01-03 NOTE — Assessment & Plan Note (Signed)
Expect this to improve with the weight loss

## 2018-01-03 NOTE — Patient Instructions (Addendum)
Do not take metformin when fasting or if dehydrated or any chance you could be acidotic That can be fatal Return for labs on or after May 28th We'll communicate through MyChart Call or write before then if you have any issues

## 2018-01-03 NOTE — Assessment & Plan Note (Signed)
Check lipids in May 2019

## 2018-01-03 NOTE — Assessment & Plan Note (Signed)
Do NOT take metformin if fasting or acidotic, can be fatal I explained; with weight loss, could potentially stop medicine(s)

## 2018-01-03 NOTE — Assessment & Plan Note (Signed)
BMI all the way down to 30.22; he does not have a number of pounds in mind; still has some belly fat to go; his goal is to be able to come off medicine

## 2018-01-14 ENCOUNTER — Other Ambulatory Visit: Payer: Self-pay | Admitting: Family Medicine

## 2018-01-15 NOTE — Assessment & Plan Note (Signed)
Check labs in May

## 2018-02-07 ENCOUNTER — Ambulatory Visit: Payer: BC Managed Care – PPO | Admitting: Family Medicine

## 2018-02-07 ENCOUNTER — Encounter: Payer: Self-pay | Admitting: Family Medicine

## 2018-02-07 VITALS — BP 116/64 | HR 79 | Temp 97.4°F | Resp 14 | Ht 72.0 in | Wt 230.0 lb

## 2018-02-07 DIAGNOSIS — E119 Type 2 diabetes mellitus without complications: Secondary | ICD-10-CM | POA: Diagnosis not present

## 2018-02-07 DIAGNOSIS — Z8042 Family history of malignant neoplasm of prostate: Secondary | ICD-10-CM

## 2018-02-07 DIAGNOSIS — I10 Essential (primary) hypertension: Secondary | ICD-10-CM | POA: Diagnosis not present

## 2018-02-07 DIAGNOSIS — E349 Endocrine disorder, unspecified: Secondary | ICD-10-CM | POA: Diagnosis not present

## 2018-02-07 DIAGNOSIS — J029 Acute pharyngitis, unspecified: Secondary | ICD-10-CM | POA: Diagnosis not present

## 2018-02-07 DIAGNOSIS — Z125 Encounter for screening for malignant neoplasm of prostate: Secondary | ICD-10-CM

## 2018-02-07 LAB — POCT RAPID STREP A (OFFICE): Rapid Strep A Screen: NEGATIVE

## 2018-02-07 NOTE — Progress Notes (Signed)
BP 116/64   Pulse 79   Temp (!) 97.4 F (36.3 C) (Oral)   Resp 14   Ht 6' (1.829 m)   Wt 230 lb (104.3 kg)   SpO2 96%   BMI 31.19 kg/m    Subjective:    Patient ID: Ryan Ritter, male    DOB: Feb 25, 1966, 52 y.o.   MRN: [id number]  HPI: Ryan Ritter is a 52 y.o. male  Chief Complaint  Patient presents with  . URI    onset 1 week, symptoms include: congestion, runny nose, sinus, cough, headache and sore throat    HPI  Patient is here for a sick visit Throat has been sore Started with bad headache No travel Sinus congestion, drainage down the back of the throat More cough the last few days; some stuff is coming up Tried salt water gargling at night, generic flonase, generic xyzal Was burning brush pile that day and then the headache hit that night Gets allergy shots but skipped this week Wife has same symptoms  Type 2 DM Checking sugars 131, 133, 119, 144, 125, 139, 88, 117, 135, 162, 93, 112, 128 Can feel it when it's 90 or below; not exactly light-headed or dizzy, but feels dizzy  He is concerned about his positive fam hx of prostate cancer; hx of low testosterone as well; saw urologist  Depression screen [facility] 2/9 01/03/2018 12/06/2017 08/29/2017 05/29/2017 02/26/2017  Decreased Interest 0 0 0 0 0  Down, Depressed, Hopeless 0 0 0 0 0  PHQ - 2 Score 0 0 0 0 0    Relevant past medical, surgical, family and social history reviewed Past Medical History:  Diagnosis Date  . Allergy   . Diabetes mellitus without complication (HCC)   . Hyperlipidemia   . Obesity 03/14/2015  . OSA on CPAP 05/29/2017  . Sleep apnea    Past Surgical History:  Procedure Laterality Date  . tendinitis Left   . vascectomy  08/2014   Family History  Problem Relation Age of Onset  . Diabetes Father   . Pancreatic cancer Father   . Diabetes Brother   . Diabetes Maternal Grandmother   . Breast cancer Mother   . Colon polyps Mother    Social History   Tobacco Use  . Smoking status:  Never Smoker  . Smokeless tobacco: Former Neurosurgeon    Types: Chew  Substance Use Topics  . Alcohol use: No    Alcohol/week: 0.0 oz  . Drug use: No    Interim medical history since last visit reviewed. Allergies and medications reviewed  Review of Systems Per HPI unless specifically indicated above     Objective:    BP 116/64   Pulse 79   Temp (!) 97.4 F (36.3 C) (Oral)   Resp 14   Ht 6' (1.829 m)   Wt 230 lb (104.3 kg)   SpO2 96%   BMI 31.19 kg/m   Wt Readings from Last 3 Encounters:  02/07/18 230 lb (104.3 kg)  01/03/18 222 lb 12.8 oz (101.1 kg)  12/06/17 238 lb 11.2 oz (108.3 kg)    Physical Exam  Constitutional: He appears well-developed and well-nourished. No distress.  HENT:  Right Ear: Tympanic membrane and ear canal normal.  Left Ear: Tympanic membrane and ear canal normal.  Nose: Rhinorrhea (clear) present.  Mouth/Throat: Posterior oropharyngeal erythema (mild) present. No posterior oropharyngeal edema.  Eyes: No scleral icterus.  Cardiovascular: Normal rate and regular rhythm.  Pulmonary/Chest: Effort normal and breath  sounds normal.  Neurological: He is alert.  Skin: No pallor.  Psychiatric: He has a normal mood and affect. His mood appears not anxious.   Diabetic Foot Form - Detailed   Diabetic Foot Exam - detailed Diabetic Foot exam was performed with the following findings:  Yes 02/07/2018  4:24 PM  Visual Foot Exam completed.:  Yes  Pulse Foot Exam completed.:  Yes  Right Dorsalis Pedis:  Present Left Dorsalis Pedis:  Present  Sensory Foot Exam Completed.:  Yes Semmes-Weinstein Monofilament Test R Site 1-Great Toe:  Pos L Site 1-Great Toe:  Pos          Assessment & Plan:   Problem List Items Addressed This Visit      Cardiovascular and Mediastinum   Benign essential HTN    Controlled; avoid decongestants with colds, which can raise BP        Endocrine   Diabetes mellitus without complication (HCC)    Foot exam done (at every visit)          Other   Hypotestosteronism   Relevant Orders   Testosterone    Other Visit Diagnoses    Sore throat    -  Primary   likely viral; will swab for group A strep; rest, hydration, symptomatic care   Relevant Orders   POCT rapid strep A (Completed)   Culture, Group A Strep (Completed)   Family hx of prostate cancer       check PSA at patient's request; normal screening at 55   Relevant Orders   PSA   Screening for prostate cancer       Relevant Orders   PSA       Follow up plan: No follow-ups on file.  An after-visit summary was printed and given to the patient at check-out.  Please see the patient instructions which may contain other information and recommendations beyond what is mentioned above in the assessment and plan.  No orders of the defined types were placed in this encounter.   Orders Placed This Encounter  Procedures  . Culture, Group A Strep  . PSA  . Testosterone  . POCT rapid strep A

## 2018-02-07 NOTE — Patient Instructions (Addendum)
Try vitamin C (orange juice if not diabetic or vitamin C tablets) and drink green tea to help your immune system during your illness Get plenty of rest and hydration   Sore Throat When you have a sore throat, your throat may:  Hurt.  Burn.  Feel irritated.  Feel scratchy.  Many things can cause a sore throat, including:  An infection.  Allergies.  Dryness in the air.  Smoke or pollution.  Gastroesophageal reflux disease (GERD).  A tumor.  A sore throat can be the first sign of another sickness. It can happen with other problems, like coughing or a fever. Most sore throats go away without treatment. Follow these instructions at home:  Take over-the-counter medicines only as told by your doctor.  Drink enough fluids to keep your pee (urine) clear or pale yellow.  Rest when you feel you need to.  To help with pain, try: ? Sipping warm liquids, such as broth, herbal tea, or warm water. ? Eating or drinking cold or frozen liquids, such as frozen ice pops. ? Gargling with a salt-water mixture 3-4 times a day or as needed. To make a salt-water mixture, add -1 tsp of salt in 1 cup of warm water. Mix it until you cannot see the salt anymore. ? Sucking on hard candy or throat lozenges. ? Putting a cool-mist humidifier in your bedroom at night. ? Sitting in the bathroom with the door closed for 5-10 minutes while you run hot water in the shower.  Do not use any tobacco products, such as cigarettes, chewing tobacco, and e-cigarettes. If you need help quitting, ask your doctor. Contact a doctor if:  You have a fever for more than 2-3 days.  You keep having symptoms for more than 2-3 days.  Your throat does not get better in 7 days.  You have a fever and your symptoms suddenly get worse. Get help right away if:  You have trouble breathing.  You cannot swallow fluids, soft foods, or your saliva.  You have swelling in your throat or neck that gets worse.  You keep  feeling like you are going to throw up (vomit).  You keep throwing up. This information is not intended to replace advice given to you by your health care provider. Make sure you discuss any questions you have with your health care provider. Document Released: 06/26/2008 Document Revised: 05/13/2016 Document Reviewed: 07/08/2015 Elsevier Interactive Patient Education  [employer].

## 2018-02-09 LAB — CULTURE, GROUP A STREP
MICRO NUMBER:: 90574604
SPECIMEN QUALITY:: ADEQUATE

## 2018-02-18 NOTE — Assessment & Plan Note (Signed)
Controlled; avoid decongestants with colds, which can raise BP

## 2018-02-18 NOTE — Assessment & Plan Note (Signed)
Foot exam done (at every visit)

## 2018-02-25 ENCOUNTER — Other Ambulatory Visit: Payer: Self-pay | Admitting: Family Medicine

## 2018-02-25 ENCOUNTER — Other Ambulatory Visit: Payer: Self-pay

## 2018-02-25 DIAGNOSIS — Z5181 Encounter for therapeutic drug level monitoring: Secondary | ICD-10-CM

## 2018-02-25 DIAGNOSIS — Z8042 Family history of malignant neoplasm of prostate: Secondary | ICD-10-CM

## 2018-02-25 DIAGNOSIS — E119 Type 2 diabetes mellitus without complications: Secondary | ICD-10-CM

## 2018-02-25 DIAGNOSIS — Z125 Encounter for screening for malignant neoplasm of prostate: Secondary | ICD-10-CM

## 2018-02-25 DIAGNOSIS — E785 Hyperlipidemia, unspecified: Secondary | ICD-10-CM

## 2018-02-25 DIAGNOSIS — E349 Endocrine disorder, unspecified: Secondary | ICD-10-CM

## 2018-02-26 ENCOUNTER — Other Ambulatory Visit: Payer: Self-pay | Admitting: Family Medicine

## 2018-02-26 LAB — COMPLETE METABOLIC PANEL WITH GFR
AG Ratio: 2.1 (calc) (ref 1.0–2.5)
ALT: 12 U/L (ref 9–46)
AST: 11 U/L (ref 10–35)
Albumin: 4.6 g/dL (ref 3.6–5.1)
Alkaline phosphatase (APISO): 34 U/L — ABNORMAL LOW (ref 40–115)
BUN: 23 mg/dL (ref 7–25)
CO2: 25 mmol/L (ref 20–32)
Calcium: 9.2 mg/dL (ref 8.6–10.3)
Chloride: 105 mmol/L (ref 98–110)
Creat: 0.82 mg/dL (ref 0.70–1.33)
GFR, Est African American: 118 mL/min/{1.73_m2} (ref >=60)
GFR, Est Non African American: 102 mL/min/{1.73_m2} (ref >=60)
Globulin: 2.2 g/dL (calc) (ref 1.9–3.7)
Glucose, Bld: 144 mg/dL — ABNORMAL HIGH (ref 65–99)
Potassium: 4.3 mmol/L (ref 3.5–5.3)
Sodium: 138 mmol/L (ref 135–146)
Total Bilirubin: 0.5 mg/dL (ref 0.2–1.2)
Total Protein: 6.8 g/dL (ref 6.1–8.1)

## 2018-02-26 LAB — LIPID PANEL
Cholesterol: 148 mg/dL (ref <200)
HDL: 32 mg/dL — ABNORMAL LOW (ref >40)
LDL Cholesterol (Calc): 91 mg/dL (calc)
Non-HDL Cholesterol (Calc): 116 mg/dL (calc) (ref <130)
Total CHOL/HDL Ratio: 4.6 (calc) (ref <5.0)
Triglycerides: 149 mg/dL (ref <150)

## 2018-02-26 LAB — HEMOGLOBIN A1C
Hgb A1c MFr Bld: 5.4 % of total Hgb (ref <5.7)
Mean Plasma Glucose: 108 (calc)
eAG (mmol/L): 6 (calc)

## 2018-02-26 LAB — TESTOSTERONE: Testosterone: 279 ng/dL (ref 250–827)

## 2018-02-26 LAB — PSA: PSA: 0.4 ng/mL (ref <=4.0)

## 2018-02-26 MED ORDER — METFORMIN HCL 1000 MG PO TABS: 500.0000 mg | ORAL_TABLET | Freq: Two times a day (BID) | ORAL | 1 refills | Status: DC

## 2018-02-26 NOTE — Progress Notes (Signed)
Stop janumet; start plain lower dose metformin

## 2018-04-01 ENCOUNTER — Other Ambulatory Visit: Payer: Self-pay | Admitting: Family Medicine

## 2018-04-02 NOTE — Telephone Encounter (Signed)
I received two requests for losartan; one was for half of a pill daily (correct) and one was for one whole pill daily (incorrect) Rx sent for half of a pill daily

## 2018-04-04 ENCOUNTER — Ambulatory Visit: Payer: BC Managed Care – PPO | Admitting: Family Medicine

## 2018-06-27 ENCOUNTER — Other Ambulatory Visit: Payer: Self-pay | Admitting: Family Medicine

## 2018-07-03 ENCOUNTER — Ambulatory Visit: Payer: BC Managed Care – PPO | Admitting: Family Medicine

## 2018-09-02 ENCOUNTER — Encounter: Payer: Self-pay | Admitting: Family Medicine

## 2018-09-02 ENCOUNTER — Ambulatory Visit: Payer: BC Managed Care – PPO | Admitting: Family Medicine

## 2018-09-02 VITALS — BP 112/64 | HR 71 | Temp 97.5°F | Ht 72.0 in | Wt 210.2 lb

## 2018-09-02 DIAGNOSIS — I10 Essential (primary) hypertension: Secondary | ICD-10-CM

## 2018-09-02 DIAGNOSIS — E785 Hyperlipidemia, unspecified: Secondary | ICD-10-CM | POA: Diagnosis not present

## 2018-09-02 DIAGNOSIS — Z5181 Encounter for therapeutic drug level monitoring: Secondary | ICD-10-CM

## 2018-09-02 DIAGNOSIS — E119 Type 2 diabetes mellitus without complications: Secondary | ICD-10-CM | POA: Diagnosis not present

## 2018-09-02 DIAGNOSIS — Z114 Encounter for screening for human immunodeficiency virus [HIV]: Secondary | ICD-10-CM

## 2018-09-02 DIAGNOSIS — E349 Endocrine disorder, unspecified: Secondary | ICD-10-CM

## 2018-09-02 DIAGNOSIS — E786 Lipoprotein deficiency: Secondary | ICD-10-CM | POA: Diagnosis not present

## 2018-09-02 MED ORDER — LOSARTAN POTASSIUM 25 MG PO TABS: 12.5000 mg | ORAL_TABLET | Freq: Every day | ORAL | 11 refills | Status: DC

## 2018-09-02 NOTE — Assessment & Plan Note (Signed)
Check renal function and liver function today

## 2018-09-02 NOTE — Patient Instructions (Signed)
Try to limit saturated fats in your diet (bologna, hot dogs, barbeque, cheeseburgers, hamburgers, steak, bacon, sausage, cheese, etc.) and get more fresh fruits, vegetables, and whole grains Please do see your eye doctor regularly, and have your eyes examined every year (or more often per his or her recommendation) Check your feet every night and let me know right away of any sores, infections, numbness, etc. Try to limit sweets, white bread, white rice, white potatoes It is okay with me for you to not check your fingerstick blood sugars unless you are interested and feel it would be helpful for you We'll get labs today If you have not heard anything from my staff in a week about any orders/referrals/studies from today, please contact us here to follow-up [phone]

## 2018-09-02 NOTE — Assessment & Plan Note (Signed)
Check lipids today; expect improved numbers

## 2018-09-02 NOTE — Assessment & Plan Note (Signed)
Expect his numbers to be much improved with weight loss and better eating; foot exam by MD today; he'll call to make eye appt

## 2018-09-02 NOTE — Assessment & Plan Note (Signed)
Check level 

## 2018-09-02 NOTE — Progress Notes (Signed)
BP 112/64   Pulse 71   Temp (!) 97.5 F (36.4 C)   Ht 6' (1.829 m)   Wt 210 lb 3.2 oz (95.3 kg)   SpO2 97%   BMI 28.51 kg/m    Subjective:    Patient ID: Ryan Ritter, male    DOB: 26-Nov-1965, 52 y.o.   MRN: [id number]  HPI: Ryan Rawracy L Forgy is a 52 y.o. male  Chief Complaint  Patient presents with  . Follow-up    HPI Here for f/u Type 2 diabetes; losing weight on purpose with keto diet and intermittent fasting; no dry mouth or blurred vision; last eye exam Sep 04, 2017 and he'll call to make the appointment He has been checking FSBS: 129, 122, 127, 103, 112, 123, 124, 145, 110, 109, 102, 137, 106 No low sugars; wears reading glasses; no cataracts or other vision problems No problems with the metformin  Excellent control of BP; on ARB just for renal protection; BP controlled  Allergies; on xyzal and fluticasone, pretty much the same; the xyzal is best if taken with food; he is thinking he might have a food allergy; he gets allergy shots and he has talked to allergist about food allergy testing; he will consider elimination diet  Low testosterone; he has been doing some things to help with that on his own; not seeing urologist; a little boron he read about it, so he is taking a supplement  [facility] 09/02/2018 01/03/2018 12/06/2017 08/29/2017 05/29/2017  Decreased Interest 0 0 0 0 0  Down, Depressed, Hopeless 0 0 0 0 0  PHQ - 2 Score 0 0 0 0 0  Altered sleeping 0 - - - -  Tired, decreased energy 0 - - - -  Change in appetite 0 - - - -  Feeling bad or failure about yourself  0 - - - -  Trouble concentrating 0 - - - -  Moving slowly or fidgety/restless 0 - - - -  Suicidal thoughts 0 - - - -  PHQ-9 Score 0 - - - -  Difficult doing work/chores Not difficult at all - - - -   Fall Risk  09/02/2018 01/03/2018 12/06/2017 08/29/2017 05/29/2017  Falls in the past year? 0 No No No No    Relevant past medical, surgical, family and social history reviewed Past Medical  History:  Diagnosis Date  . Allergy   . Diabetes mellitus without complication (HCC)   . Hyperlipidemia   . Obesity 03/14/2015  . OSA on CPAP 05/29/2017  . Sleep apnea    Past Surgical History:  Procedure Laterality Date  . tendinitis Left   . vascectomy  08/2014   Family History  Problem Relation Age of Onset  . Diabetes Father   . Pancreatic cancer Father   . Diabetes Brother   . Diabetes Maternal Grandmother   . Breast cancer Mother   . Colon polyps Mother    Social History   Tobacco Use  . Smoking status: Never Smoker  . Smokeless tobacco: Former NeurosurgeonUser    Types: Chew  Substance Use Topics  . Alcohol use: No    Alcohol/week: 0.0 standard drinks  . Drug use: No     [facility]  AUDIT-C Score  0      Interim medical history since last visit reviewed. Allergies and medications reviewed  Review of Systems  Constitutional: Negative for unexpected weight change (intentional).  Cardiovascular: Negative for  chest pain.  Gastrointestinal: Negative for blood in stool.  Genitourinary: Negative for hematuria.   Per HPI unless specifically indicated above     Objective:    BP 112/64   Pulse 71   Temp (!) 97.5 F (36.4 C)   Ht 6' (1.829 m)   Wt 210 lb 3.2 oz (95.3 kg)   SpO2 97%   BMI 28.51 kg/m   Wt Readings from Last 3 Encounters:  09/02/18 210 lb 3.2 oz (95.3 kg)  02/07/18 230 lb (104.3 kg)  01/03/18 222 lb 12.8 oz (101.1 kg)    Physical Exam  Constitutional: He appears well-developed and well-nourished. No distress.  Weight loss acknowledged  HENT:  Head: Normocephalic and atraumatic.  Eyes: EOM are normal. No scleral icterus.  Neck: No thyromegaly present.  Cardiovascular: Normal rate and regular rhythm.  Pulmonary/Chest: Effort normal and breath sounds normal.  Abdominal: Soft. Bowel sounds are normal. He exhibits no distension.  Musculoskeletal: He exhibits no edema.  Neurological: Coordination  normal.  Skin: Skin is warm and dry. No pallor.  Psychiatric: He has a normal mood and affect. His behavior is normal. Judgment and thought content normal.   Diabetic Foot Form - Detailed   Diabetic Foot Exam - detailed Diabetic Foot exam was performed with the following findings:  Yes 09/02/2018  8:17 AM  Visual Foot Exam completed.:  Yes  Pulse Foot Exam completed.:  Yes  Right Dorsalis Pedis:  Present Left Dorsalis Pedis:  Present  Sensory Foot Exam Completed.:  Yes Semmes-Weinstein Monofilament Test R Site 1-Great Toe:  Pos L Site 1-Great Toe:  Pos        Results for orders placed or performed in visit on 02/25/18  Testosterone  Result Value Ref Range   Testosterone 279 250 - 827 ng/dL  PSA  Result Value Ref Range   PSA 0.4 < OR = 4.0 ng/mL  Lipid panel  Result Value Ref Range   Cholesterol 148 <200 mg/dL   HDL 32 (L) >40 mg/dL   Triglycerides 981 <191 mg/dL   LDL Cholesterol (Calc) 91 mg/dL (calc)   Total CHOL/HDL Ratio 4.6 <5.0 (calc)   Non-HDL Cholesterol (Calc) 116 <130 mg/dL (calc)  Hemoglobin Y7W  Result Value Ref Range   Hgb A1c MFr Bld 5.4 <5.7 % of total Hgb   Mean Plasma Glucose 108 (calc)   eAG (mmol/L) 6.0 (calc)  COMPLETE METABOLIC PANEL WITH GFR  Result Value Ref Range   Glucose, Bld 144 (H) 65 - 99 mg/dL   BUN 23 7 - 25 mg/dL   Creat 2.95 6.21 - 3.08 mg/dL   GFR, Est Non African American 102 > OR = 60 mL/min/1.70m2   GFR, Est African American 118 > OR = 60 mL/min/1.71m2   BUN/Creatinine Ratio NOT APPLICABLE 6 - 22 (calc)   Sodium 138 135 - 146 mmol/L   Potassium 4.3 3.5 - 5.3 mmol/L   Chloride 105 98 - 110 mmol/L   CO2 25 20 - 32 mmol/L   Calcium 9.2 8.6 - 10.3 mg/dL   Total Protein 6.8 6.1 - 8.1 g/dL   Albumin 4.6 3.6 - 5.1 g/dL   Globulin 2.2 1.9 - 3.7 g/dL (calc)   AG Ratio 2.1 1.0 - 2.5 (calc)   Total Bilirubin 0.5 0.2 - 1.2 mg/dL   Alkaline phosphatase (APISO) 34 (L) 40 - 115 U/L   AST 11 10 - 35 U/L   ALT 12 9 - 46 U/L        Assessment &  Plan:   Problem List Items Addressed This Visit      Cardiovascular and Mediastinum   Benign essential HTN    Excellent control with weight loss and healthier eating      Relevant Medications   losartan (COZAAR) 25 MG tablet     Endocrine   Diabetes mellitus without complication (HCC) - Primary    Expect his numbers to be much improved with weight loss and better eating; foot exam by MD today; he'll call to make eye appt      Relevant Medications   losartan (COZAAR) 25 MG tablet   Other Relevant Orders   Microalbumin / creatinine urine ratio   Hemoglobin A1c     Other   Medication monitoring encounter    Check renal function and liver function today      Relevant Orders   COMPLETE METABOLIC PANEL WITH GFR   Low HDL (under 40)    Check lipids; expect numbers to be improved      Relevant Orders   Lipid panel   Hypotestosteronism    Check level      Relevant Orders   Testosterone , Free and Total   Hyperlipidemia LDL goal <70    Check lipids today; expect improved numbers      Relevant Medications   losartan (COZAAR) 25 MG tablet   Other Relevant Orders   Lipid panel    Other Visit Diagnoses    Encounter for screening for HIV       Relevant Orders   HIV Antibody (routine testing w rflx)       Follow up plan: Return in about 6 months (around 03/04/2019) for follow-up visit with Dr. Sherie Don.  An after-visit summary was printed and given to the patient at check-out.  Please see the patient instructions which may contain other information and recommendations beyond what is mentioned above in the assessment and plan.  Meds ordered this encounter  Medications  . losartan (COZAAR) 25 MG tablet    Sig: Take 0.5 tablets (12.5 mg total) by mouth daily.    Dispense:  15 tablet    Refill:  11    Cancel other 50 mg strength, we're reducing the dose    Orders Placed This Encounter  Procedures  . Testosterone , Free and Total  . Microalbumin /  creatinine urine ratio  . Lipid panel  . Hemoglobin A1c  . COMPLETE METABOLIC PANEL WITH GFR  . HIV Antibody (routine testing w rflx)

## 2018-09-02 NOTE — Assessment & Plan Note (Signed)
Excellent control with weight loss and healthier eating

## 2018-09-02 NOTE — Assessment & Plan Note (Signed)
Check lipids; expect numbers to be improved

## 2018-09-03 LAB — HIV ANTIBODY (ROUTINE TESTING W REFLEX): HIV 1&2 Ab, 4th Generation: NONREACTIVE

## 2018-09-05 ENCOUNTER — Other Ambulatory Visit: Payer: Self-pay | Admitting: Family Medicine

## 2018-09-05 DIAGNOSIS — R740 Nonspecific elevation of levels of transaminase and lactic acid dehydrogenase [LDH]: Principal | ICD-10-CM

## 2018-09-05 DIAGNOSIS — R7401 Elevation of levels of liver transaminase levels: Secondary | ICD-10-CM | POA: Insufficient documentation

## 2018-09-05 NOTE — Progress Notes (Signed)
[facility]      Assessment and Plan:  52 year old male with obstructive sleep apnea.  Obstructive sleep apnea.  -Continue using cpap every night.  --Residual AHI of 4.  --Continue CPAP auto pressure to 7-14 cm H2O.   Allergic rhinitis.  -Continue Xyzal antihistamine. -Has Flonase but is not using, encouraged to use it once daily.  Diabetes mellitus. -Sleep apnea can contribute to elevated blood glucose levels, therefore, treatment of sleep apnea. His needed to adequately control the patient's diabetes mellitus.Pt's a1c is well controlled.    Orders Placed This Encounter  Procedures  . Ambulatory Referral for DME   Return in about 1 year (around 09/09/2019).   Date: 09/05/2018  MRN# [id number] Ryan Ritter 1966/06/02   Ryan Ritter is a 52 y.o. old male seen in consultation for chief complaint of:    Chief Complaint  Patient presents with  . Sleep Apnea    pt wears cpap nightly. He states he needs another mask.    HPI:   The patient is a 52 year old fmale, with aa history of obstructive sleep apnea but was no longer using CPAP.  Per insurance guidelines  was sent for a new sleep study in order to requalify for CPAP.   repeat home sleep study showed obstructive sleep apnea with an apnea hypotony index of 11, consistent with mild sleep apnea, it was moderate in the supine position and he was hence started on auto CPAP with pressure range of 5-20.  He has been doing well with the new machine and feels that he is sleeping well at night, he is no longer sleepy during the day. He is not snoring.    **Review of download data 08/06/2018-09/04/2018>> raw data personally reviewed, uses greater than 4 hours is 30/30 days.  Average usage on days used 6 hours 34 minutes.  Pressure ranges 7-14.  Median pressure 8, 95th percentile pressure 10, maximum pressure 11.  Residual AHI is 3.1.  Overall this test with excellent compliance with excellent control of  obstructive sleep apnea. **Review of download data 30 days as of 09/04/17: Average usage is 23/30, however he notes that the machine is dinging him for the nights before he started using it..  Average usage on days used 6 hours 43 minutes.  Auto 5-20.  Median pressure 7, 95th percentile pressure is 9, maximum pressure is 10.  Residual AHI is 4.2.  Medication:    Current Outpatient Medications:  .  Alpha-Lipoic Acid 200 MG CAPS, Take 200 mg by mouth 2 (two) times daily., Disp: , Rfl:  .  aspirin 81 MG tablet, Take 81 mg by mouth daily., Disp: , Rfl:  .  Barberry-Oreg Grape-Goldenseal (BERBERINE COMPLEX PO), Take 500 mg by mouth 2 (two) times daily., Disp: , Rfl:  .  Boron 3 MG CAPS, Take 1 capsule by mouth 3 (three) times daily., Disp: , Rfl:  .  CALCIUM-MAGNESIUM-ZINC PO, Take 1 tablet by mouth daily., Disp: , Rfl:  .  Chromium 200 MCG CAPS, Take 200 mcg by mouth 2 (two) times daily. , Disp: , Rfl:  .  Cinnamon 500 MG TABS, Take 1,000 tablets by mouth 2 (two) times daily., Disp: , Rfl:  .  Coconut Oil 1000 MG CAPS, Take 2,000 mg by mouth 2 (two) times daily., Disp: , Rfl:  .  Cod Liver Oil 1000 MG CAPS, Take 1 capsule by mouth 2 (two) times daily. , Disp: , Rfl:  .  Coenzyme Q10 (CO Q10)  200 MG CAPS, Take 200 mg by mouth daily., Disp: , Rfl:  .  doxycycline (MONODOX) 50 MG capsule, TAKE 1 CAPSULE EVERY DAY, Disp: 30 capsule, Rfl: 5 .  fluticasone (FLONASE) 50 MCG/ACT nasal spray, Place 2 sprays into both nostrils daily., Disp: , Rfl:  .  levocetirizine (XYZAL) 5 MG tablet, Take 5 mg by mouth every evening. , Disp: , Rfl:  .  losartan (COZAAR) 25 MG tablet, Take 0.5 tablets (12.5 mg total) by mouth daily., Disp: 15 tablet, Rfl: 11 .  meloxicam (MOBIC) 15 MG tablet, Take 15 mg by mouth as needed., Disp: , Rfl:  .  metFORMIN (GLUCOPHAGE) 1000 MG tablet, Take 0.5 tablets (500 mg total) by mouth 2 (two) times daily with a meal., Disp: 90 tablet, Rfl: 1 .  POTASSIUM GLUCONATE PO, Take 1 tablet by  mouth daily., Disp: , Rfl:  .  Turmeric 500 MG CAPS, Take 500 mg by mouth daily., Disp: , Rfl:  .  VASCEPA 1 g CAPS, TAKE 2 CAPSULES TWICE DAILY, Disp: 120 capsule, Rfl: 5   Allergies:  Benicar [olmesartan] and Lisinopril  Review of Systems:  Constitutional: Feels well. Cardiovascular: Denies chest pain, exertional chest pain.  Pulmonary: Denies hemoptysis, pleuritic chest pain.   The remainder of systems were reviewed and were found to be negative other than what is documented in the HPI.    Physical Examination:   VS: BP 102/62 (BP Location: Left Arm, Cuff Size: Large)   Pulse 66   Resp 16   Ht 6' (1.829 m)   Wt 211 lb (95.7 kg)   SpO2 97%   BMI 28.62 kg/m   General Appearance: No distress  Neuro:without focal findings, mental status, speech normal, alert and oriented HEENT: PERRLA, EOM intact Pulmonary: No wheezing, No rales  CardiovascularNormal S1,S2.  No m/r/g.  Abdomen: Benign, Soft, non-tender, No masses Renal:  No costovertebral tenderness  GU:  No performed at this time. Endoc: No evident thyromegaly, no signs of acromegaly or Cushing features Skin:   warm, no rashes, no ecchymosis  Extremities: normal, no cyanosis, clubbing.      LABORATORY PANEL:   CBC No results for input(s): WBC, HGB, HCT, PLT in the last 168 hours. ------------------------------------------------------------------------------------------------------------------  Chemistries  Recent Labs  Lab 09/02/18 0819  NA 140  K 4.1  CL 103  CO2 27  GLUCOSE 166*  BUN 20  CREATININE 0.91  CALCIUM 9.6  AST 47*  ALT 40  BILITOT 0.7   ------------------------------------------------------------------------------------------------------------------  Cardiac Enzymes No results for input(s): TROPONINI in the last 168 hours. ------------------------------------------------------------  RADIOLOGY:  No results found.     Thank  you for the consultation and for allowing [facility] [place]  [facility], [facility] to assist in the care of your patient. Our recommendations are noted above.  Please contact us if we can be of further service.  Wells Guiles, M.D., F.C.C.P.  Board Certified in Internal Medicine, Pulmonary Medicine, Critical Care Medicine, and Sleep Medicine.  [place] [facility] Office Number: [phone]   09/05/2018

## 2018-09-05 NOTE — Progress Notes (Signed)
Recheck liver tests in a couple of weeks; note through [employer]

## 2018-09-07 LAB — LIPID PANEL
Cholesterol: 161 mg/dL (ref <200)
HDL: 43 mg/dL (ref >40)
LDL Cholesterol (Calc): 100 mg/dL (calc) — ABNORMAL HIGH
Non-HDL Cholesterol (Calc): 118 mg/dL (calc) (ref <130)
Total CHOL/HDL Ratio: 3.7 (calc) (ref <5.0)
Triglycerides: 88 mg/dL (ref <150)

## 2018-09-07 LAB — COMPLETE METABOLIC PANEL WITH GFR
AG Ratio: 2.1 (calc) (ref 1.0–2.5)
ALT: 40 U/L (ref 9–46)
AST: 47 U/L — ABNORMAL HIGH (ref 10–35)
Albumin: 4.4 g/dL (ref 3.6–5.1)
Alkaline phosphatase (APISO): 35 U/L — ABNORMAL LOW (ref 40–115)
BUN: 20 mg/dL (ref 7–25)
CO2: 27 mmol/L (ref 20–32)
Calcium: 9.6 mg/dL (ref 8.6–10.3)
Chloride: 103 mmol/L (ref 98–110)
Creat: 0.91 mg/dL (ref 0.70–1.33)
GFR, Est African American: 112 mL/min/{1.73_m2} (ref >=60)
GFR, Est Non African American: 97 mL/min/{1.73_m2} (ref >=60)
Globulin: 2.1 g/dL (calc) (ref 1.9–3.7)
Glucose, Bld: 166 mg/dL — ABNORMAL HIGH (ref 65–99)
Potassium: 4.1 mmol/L (ref 3.5–5.3)
Sodium: 140 mmol/L (ref 135–146)
Total Bilirubin: 0.7 mg/dL (ref 0.2–1.2)
Total Protein: 6.5 g/dL (ref 6.1–8.1)

## 2018-09-07 LAB — TESTOSTERONE, FREE & TOTAL
Free Testosterone: 57.3 pg/mL (ref 35.0–155.0)
Testosterone, Total, LC-MS-MS: 285 ng/dL (ref 250–1100)

## 2018-09-07 LAB — HEMOGLOBIN A1C
Hgb A1c MFr Bld: 5.8 % of total Hgb — ABNORMAL HIGH (ref <5.7)
Mean Plasma Glucose: 120 (calc)
eAG (mmol/L): 6.6 (calc)

## 2018-09-07 LAB — MICROALBUMIN / CREATININE URINE RATIO
Creatinine, Urine: 174 mg/dL (ref 20–320)
Microalb Creat Ratio: 3 mcg/mg creat (ref <30)
Microalb, Ur: 0.5 mg/dL

## 2018-09-08 ENCOUNTER — Ambulatory Visit: Payer: BC Managed Care – PPO | Admitting: Internal Medicine

## 2018-09-08 ENCOUNTER — Other Ambulatory Visit: Payer: Self-pay | Admitting: Family Medicine

## 2018-09-08 ENCOUNTER — Encounter: Payer: Self-pay | Admitting: Internal Medicine

## 2018-09-08 VITALS — BP 102/62 | HR 66 | Resp 16 | Ht 72.0 in | Wt 211.0 lb

## 2018-09-08 DIAGNOSIS — R7401 Elevation of levels of liver transaminase levels: Secondary | ICD-10-CM

## 2018-09-08 DIAGNOSIS — G4733 Obstructive sleep apnea (adult) (pediatric): Secondary | ICD-10-CM

## 2018-09-08 DIAGNOSIS — R740 Nonspecific elevation of levels of transaminase and lactic acid dehydrogenase [LDH]: Principal | ICD-10-CM

## 2018-09-08 NOTE — Progress Notes (Signed)
Check labs in one month

## 2018-09-08 NOTE — Patient Instructions (Signed)
Continue using CPAP every night.  

## 2018-09-09 ENCOUNTER — Encounter: Payer: Self-pay | Admitting: Family Medicine

## 2018-09-09 ENCOUNTER — Ambulatory Visit (INDEPENDENT_AMBULATORY_CARE_PROVIDER_SITE_OTHER): Payer: BC Managed Care – PPO | Admitting: Family Medicine

## 2018-09-09 VITALS — BP 118/64 | HR 74 | Temp 98.5°F | Ht 71.5 in | Wt 211.7 lb

## 2018-09-09 DIAGNOSIS — R2242 Localized swelling, mass and lump, left lower limb: Secondary | ICD-10-CM

## 2018-09-09 DIAGNOSIS — Z1211 Encounter for screening for malignant neoplasm of colon: Secondary | ICD-10-CM

## 2018-09-09 DIAGNOSIS — L309 Dermatitis, unspecified: Secondary | ICD-10-CM | POA: Diagnosis not present

## 2018-09-09 DIAGNOSIS — Z23 Encounter for immunization: Secondary | ICD-10-CM | POA: Diagnosis not present

## 2018-09-09 DIAGNOSIS — Z0000 Encounter for general adult medical examination without abnormal findings: Secondary | ICD-10-CM | POA: Diagnosis not present

## 2018-09-09 DIAGNOSIS — L80 Vitiligo: Secondary | ICD-10-CM

## 2018-09-09 MED ORDER — TRIAMCINOLONE ACETONIDE 0.1 % EX CREA: 1.0000 "application " | TOPICAL_CREAM | Freq: Two times a day (BID) | CUTANEOUS | 2 refills | Status: DC

## 2018-09-09 NOTE — Assessment & Plan Note (Signed)
USPSTF grade A and B recommendations reviewed with patient; age-appropriate recommendations, preventive care, screening tests, etc discussed and encouraged; healthy living encouraged; see AVS for patient education given to patient  

## 2018-09-09 NOTE — Assessment & Plan Note (Signed)
Left axilla; also in groin

## 2018-09-09 NOTE — Progress Notes (Signed)
BP 118/64   Pulse 74   Temp 98.5 F (36.9 C) (Oral)   Ht 5' 11.5" (1.816 m)   Wt 211 lb 11.2 oz (96 kg)   SpO2 97%   BMI 29.11 kg/m    Subjective:    Patient ID: Ryan Ritter, male    DOB: 27-Jul-1966, 52 y.o.   MRN: [id number]  HPI: Ryan Ritter is a 52 y.o. male  Chief Complaint  Patient presents with  . Annual Exam    HPI  He has itchy rash on the chest; also area under the left axilla and in the groin; several years, no other places; Dr. Thana Ates looked at it once   USPSTF grade A and B recommendations Depression:  Depression screen [facility] 2/9 09/09/2018 09/02/2018 01/03/2018 12/06/2017 08/29/2017  Decreased Interest 0 0 0 0 0  Down, Depressed, Hopeless 0 0 0 0 0  PHQ - 2 Score 0 0 0 0 0  Altered sleeping 0 0 - - -  Tired, decreased energy 0 0 - - -  Change in appetite 0 0 - - -  Feeling bad or failure about yourself  0 0 - - -  Trouble concentrating 0 0 - - -  Moving slowly or fidgety/restless 0 0 - - -  Suicidal thoughts 0 0 - - -  PHQ-9 Score 0 0 - - -  Difficult doing work/chores Not difficult at all Not difficult at all - - -   Hypertension: BP Readings from Last 3 Encounters:  09/09/18 118/64  09/08/18 102/62  09/02/18 112/64   Obesity: Wt Readings from Last 3 Encounters:  09/09/18 211 lb 11.2 oz (96 kg)  09/08/18 211 lb (95.7 kg)  09/02/18 210 lb 3.2 oz (95.3 kg)   BMI Readings from Last 3 Encounters:  09/09/18 29.11 kg/m  09/08/18 28.62 kg/m  09/02/18 28.51 kg/m    Immunizations: he'll consider shingrix; flu UTD, tetanus UTD; pneumonia vaccine  Skin cancer: nothing worrisome; dark skinned; not out much in the sun; very rarely got burned as a child Lung cancer:  Never smoker Prostate cancer: grandfather had prostate cancer; no sx Lab Results  Component Value Date   PSA 0.4 02/25/2018   Colorectal cancer: 2012; plans to get in 2020 and says he is overdue.five year plan; no blood  AAA: n/a Aspirin: not taking, taking cod liver oil Aspirin  is technically recommended The 10-year ASCVD risk score Denman George DC Jr., et al., 2013) is: 7.1%   Values used to calculate the score:     Age: 89 years     Sex: Male     Is Non-Hispanic African American: No     Diabetic: Yes     Tobacco smoker: No     Systolic Blood Pressure: 118 mmHg     Is BP treated: Yes     HDL Cholesterol: 43 mg/dL     Total Cholesterol: 161 mg/dL  Diet: keto and intermittent fasting Exercise: trying to get in some cardio, weights; trying Alcohol:    Office Visit from 09/09/2018 in [facility]  AUDIT-C Score  0     Tobacco use: never HIV, hep B, hep C: not interested in more testing STD testing and prevention (chl/gon/syphilis): n/a Glucose:  Glucose, Bld  Date Value Ref Range Status  09/02/2018 166 (H) 65 - 99 mg/dL Final    Comment:    .            Fasting reference interval . For  someone without known diabetes, a glucose value >125 mg/dL indicates that they may have diabetes and this should be confirmed with a follow-up test. .   02/25/2018 144 (H) 65 - 99 mg/dL Final    Comment:    .            Fasting reference interval . For someone without known diabetes, a glucose value >125 mg/dL indicates that they may have diabetes and this should be confirmed with a follow-up test. .   08/27/2017 183 (H) 65 - 99 mg/dL Final    Comment:    .            Fasting reference interval . For someone without known diabetes, a glucose value >125 mg/dL indicates that they may have diabetes and this should be confirmed with a follow-up test. .    Lipids:  Lab Results  Component Value Date   CHOL 161 09/02/2018   CHOL 148 02/25/2018   CHOL 127 08/27/2017   Lab Results  Component Value Date   HDL 43 09/02/2018   HDL 32 (L) 02/25/2018   HDL 23 (L) 08/27/2017   Lab Results  Component Value Date   LDLCALC 100 (H) 09/02/2018   LDLCALC 91 02/25/2018   LDLCALC 79 08/27/2017   Lab Results  Component Value Date   TRIG 88  09/02/2018   TRIG 149 02/25/2018   TRIG 145 08/27/2017   Lab Results  Component Value Date   CHOLHDL 3.7 09/02/2018   CHOLHDL 4.6 02/25/2018   CHOLHDL 5.5 (H) 08/27/2017   No results found for: LDLDIRECT   Depression screen [facility] 2/9 09/09/2018 09/02/2018 01/03/2018 12/06/2017 08/29/2017  Decreased Interest 0 0 0 0 0  Down, Depressed, Hopeless 0 0 0 0 0  PHQ - 2 Score 0 0 0 0 0  Altered sleeping 0 0 - - -  Tired, decreased energy 0 0 - - -  Change in appetite 0 0 - - -  Feeling bad or failure about yourself  0 0 - - -  Trouble concentrating 0 0 - - -  Moving slowly or fidgety/restless 0 0 - - -  Suicidal thoughts 0 0 - - -  PHQ-9 Score 0 0 - - -  Difficult doing work/chores Not difficult at all Not difficult at all - - -   Fall Risk  09/09/2018 09/02/2018 01/03/2018 12/06/2017 08/29/2017  Falls in the past year? 0 0 No No No    Relevant past medical, surgical, family and social history reviewed Past Medical History:  Diagnosis Date  . Allergy   . Diabetes mellitus without complication (HCC)   . Hyperlipidemia   . Obesity 03/14/2015  . OSA on CPAP 05/29/2017  . Sleep apnea    Past Surgical History:  Procedure Laterality Date  . tendinitis Left   . vascectomy  08/2014   Family History  Problem Relation Age of Onset  . Diabetes Father   . Pancreatic cancer Father   . Diabetes Brother   . Diabetes Maternal Grandmother   . Breast cancer Mother   . Colon polyps Mother    Social History   Tobacco Use  . Smoking status: Never Smoker  . Smokeless tobacco: Former Neurosurgeon    Types: Chew  Substance Use Topics  . Alcohol use: No    Alcohol/week: 0.0 standard drinks  . Drug use: No     Office Visit from 09/09/2018 in [facility]  AUDIT-C Score  0  Interim medical history since last visit reviewed. Allergies and medications reviewed  Review of Systems  Constitutional: Negative for unexpected weight change (working on weight loss).  HENT: Negative for  nosebleeds.   Eyes: Negative for visual disturbance (just reading glasses).  Respiratory: Negative for wheezing.   Cardiovascular: Negative for chest pain.  Gastrointestinal: Negative for abdominal pain and blood in stool.  Genitourinary: Negative for hematuria.  Musculoskeletal:       Lump on the left thigh; Dr. Thana Ates thinks it was lipoma; fatty accumulation; more noticeable with weight loss  Allergic/Immunologic:       Some itching that he might eat, has to take generic xyzal to help; not sure if allergic, long time; declined referral to allergist; might try elimination diet  Neurological: Negative for tremors.  Hematological: Negative for adenopathy. Does not bruise/bleed easily.   Per HPI unless specifically indicated above     Objective:    BP 118/64   Pulse 74   Temp 98.5 F (36.9 C) (Oral)   Ht 5' 11.5" (1.816 m)   Wt 211 lb 11.2 oz (96 kg)   SpO2 97%   BMI 29.11 kg/m   Wt Readings from Last 3 Encounters:  09/09/18 211 lb 11.2 oz (96 kg)  09/08/18 211 lb (95.7 kg)  09/02/18 210 lb 3.2 oz (95.3 kg)    Physical Exam Constitutional:      General: He is not in acute distress.    Appearance: He is well-developed. He is not diaphoretic.  HENT:     Head: Normocephalic and atraumatic.     Nose: Nose normal.  Eyes:     General: No scleral icterus. Neck:     Thyroid: No thyromegaly.     Vascular: No JVD.  Cardiovascular:     Rate and Rhythm: Normal rate and regular rhythm.     Heart sounds: Normal heart sounds.  Pulmonary:     Effort: Pulmonary effort is normal. No respiratory distress.     Breath sounds: Normal breath sounds. No wheezing or rales.  Abdominal:     General: Bowel sounds are normal. There is no distension.     Palpations: Abdomen is soft.     Tenderness: There is no abdominal tenderness. There is no guarding.  Musculoskeletal: Normal range of motion.     Left upper leg: He exhibits swelling (discrete mass under the skin LEFT anterior thigh, firmer  at the lateral margin; slightly prominent vein inferiorly).       Legs:  Lymphadenopathy:     Cervical: No cervical adenopathy.  Skin:    General: Skin is warm and dry.     Coloration: Skin is not pale.     Findings: Rash (erythematous slightly papular rash on teh upper chest, symmetric; no vesicles) present. No erythema.          Comments: Patch under the LEFT axilla devoid of melanin  Neurological:     Mental Status: He is alert.     Motor: No abnormal muscle tone.     Coordination: Coordination normal.     Deep Tendon Reflexes: Reflexes normal.  Psychiatric:        Behavior: Behavior normal.        Thought Content: Thought content normal.        Judgment: Judgment normal.    Diabetic Foot Form - Detailed   Diabetic Foot Exam - detailed Diabetic Foot exam was performed with the following findings:  Yes 09/09/2018  5:18 PM  Visual Foot Exam  completed.:  Yes  Pulse Foot Exam completed.:  Yes  Right Dorsalis Pedis:  Present Left Dorsalis Pedis:  Present  Sensory Foot Exam Completed.:  Yes Semmes-Weinstein Monofilament Test R Site 1-Great Toe:  Pos L Site 1-Great Toe:  Pos         Results for orders placed or performed in visit on 09/02/18  Testosterone , Free and Total  Result Value Ref Range   Testosterone, Total, LC-MS-MS 285 250 - 1,100 ng/dL   Free Testosterone 54.0 35.0 - 155.0 pg/mL  Microalbumin / creatinine urine ratio  Result Value Ref Range   Creatinine, Urine 174 20 - 320 mg/dL   Microalb, Ur 0.5 mg/dL   Microalb Creat Ratio 3 <30 mcg/mg creat  Lipid panel  Result Value Ref Range   Cholesterol 161 <200 mg/dL   HDL 43 >98 mg/dL   Triglycerides 88 <119 mg/dL   LDL Cholesterol (Calc) 100 (H) mg/dL (calc)   Total CHOL/HDL Ratio 3.7 <5.0 (calc)   Non-HDL Cholesterol (Calc) 118 <130 mg/dL (calc)  Hemoglobin J4N  Result Value Ref Range   Hgb A1c MFr Bld 5.8 (H) <5.7 % of total Hgb   Mean Plasma Glucose 120 (calc)   eAG (mmol/L) 6.6 (calc)  COMPLETE  METABOLIC PANEL WITH GFR  Result Value Ref Range   Glucose, Bld 166 (H) 65 - 99 mg/dL   BUN 20 7 - 25 mg/dL   Creat 8.29 5.62 - 1.30 mg/dL   GFR, Est Non African American 97 > OR = 60 mL/min/1.28m2   GFR, Est African American 112 > OR = 60 mL/min/1.56m2   BUN/Creatinine Ratio NOT APPLICABLE 6 - 22 (calc)   Sodium 140 135 - 146 mmol/L   Potassium 4.1 3.5 - 5.3 mmol/L   Chloride 103 98 - 110 mmol/L   CO2 27 20 - 32 mmol/L   Calcium 9.6 8.6 - 10.3 mg/dL   Total Protein 6.5 6.1 - 8.1 g/dL   Albumin 4.4 3.6 - 5.1 g/dL   Globulin 2.1 1.9 - 3.7 g/dL (calc)   AG Ratio 2.1 1.0 - 2.5 (calc)   Total Bilirubin 0.7 0.2 - 1.2 mg/dL   Alkaline phosphatase (APISO) 35 (L) 40 - 115 U/L   AST 47 (H) 10 - 35 U/L   ALT 40 9 - 46 U/L  HIV Antibody (routine testing w rflx)  Result Value Ref Range   HIV 1&2 Ab, 4th Generation NON-REACTIVE NON-REACTI      Assessment & Plan:   Problem List Items Addressed This Visit      Musculoskeletal and Integument   Vitiligo    Left axilla; also in groin        Other   Preventative health care - Primary    USPSTF grade A and B recommendations reviewed with patient; age-appropriate recommendations, preventive care, screening tests, etc discussed and encouraged; healthy living encouraged; see AVS for patient education given to patient        Other Visit Diagnoses    Screen for colon cancer       Relevant Orders   Ambulatory referral to Gastroenterology   Need for vaccine for DT (diphtheria-tetanus)       Relevant Orders   Td : Tetanus/diphtheria >7yo Preservative  free (Completed)   Mass of thigh, left       refer to surgeon for evaluation   Relevant Orders   Ambulatory referral to General Surgery   Dermatitis       upper chest; Rx for corticosteroid  to apply topically       Follow up plan: Return in about 1 year (around 09/10/2019) for complete physical.  An after-visit summary was printed and given to the patient at check-out.  Please see the  patient instructions which may contain other information and recommendations beyond what is mentioned above in the assessment and plan.  Meds ordered this encounter  Medications  . triamcinolone cream (KENALOG) 0.1 %    Sig: Apply 1 application topically 2 (two) times daily. If needed; do not use on face or in groin or under the arms    Dispense:  30 g    Refill:  2    Orders Placed This Encounter  Procedures  . Td : Tetanus/diphtheria >7yo Preservative  free  . Ambulatory referral to Gastroenterology  . Ambulatory referral to General Surgery

## 2018-09-09 NOTE — Patient Instructions (Addendum)
Consider getting the new shingles vaccine called Shingrix; that is available for individuals 52 years of age and older, and is recommended even if you have had shingles in the past and/or already received the old shingles vaccine (Zostavax); it is a two-part series, and is available at many local pharmacies  We'll have you see the surgeon If you have not heard anything from my staff in a week about any orders/referrals/studies from today, please contact us here to follow-up [phone]   You received the vaccine to protect against tetanus and diphtheria today; the tetanus and diphtheria portions will provide protection up to ten years  Return in 1-2 weeks for the recheck of your labs   Health Maintenance, Male A healthy lifestyle and preventive care is important for your health and wellness. Ask your health care provider about what schedule of regular examinations is right for you. What should I know about weight and diet? Eat a Healthy Diet  Eat plenty of vegetables, fruits, whole grains, low-fat dairy products, and lean protein.  Do not eat a lot of foods high in solid fats, added sugars, or salt.  Maintain a Healthy Weight Regular exercise can help you achieve or maintain a healthy weight. You should:  Do at least 150 minutes of exercise each week. The exercise should increase your heart rate and make you sweat (moderate-intensity exercise).  Do strength-training exercises at least twice a week.  Watch Your Levels of Cholesterol and Blood Lipids  Have your blood tested for lipids and cholesterol every 5 years starting at 52 years of age. If you are at high risk for heart disease, you should start having your blood tested when you are 52 years old. You may need to have your cholesterol levels checked more often if: ? Your lipid or cholesterol levels are high. ? You are older than 52 years of age. ? You are at high risk for heart disease.  What should I know about cancer  screening? Many types of cancers can be detected early and may often be prevented. Lung Cancer  You should be screened every year for lung cancer if: ? You are a current smoker who has smoked for at least 30 years. ? You are a former smoker who has quit within the past 15 years.  Talk to your health care provider about your screening options, when you should start screening, and how often you should be screened.  Colorectal Cancer  Routine colorectal cancer screening usually begins at 52 years of age and should be repeated every 5-10 years until you are 52 years old. You may need to be screened more often if early forms of precancerous polyps or small growths are found. Your health care provider may recommend screening at an earlier age if you have risk factors for colon cancer.  Your health care provider may recommend using home test kits to check for hidden blood in the stool.  A small camera at the end of a tube can be used to examine your colon (sigmoidoscopy or colonoscopy). This checks for the earliest forms of colorectal cancer.  Prostate and Testicular Cancer  Depending on your age and overall health, your health care provider may do certain tests to screen for prostate and testicular cancer.  Talk to your health care provider about any symptoms or concerns you have about testicular or prostate cancer.  Skin Cancer  Check your skin from head to toe regularly.  Tell your health care provider about any new moles  or changes in moles, especially if: ? There is a change in a mole's size, shape, or color. ? You have a mole that is larger than a pencil eraser.  Always use sunscreen. Apply sunscreen liberally and repeat throughout the day.  Protect yourself by wearing long sleeves, pants, a wide-brimmed hat, and sunglasses when outside.  What should I know about heart disease, diabetes, and high blood pressure?  If you are 89-68 years of age, have your blood pressure checked  every 3-5 years. If you are 84 years of age or older, have your blood pressure checked every year. You should have your blood pressure measured twice-once when you are at a hospital or clinic, and once when you are not at a hospital or clinic. Record the average of the two measurements. To check your blood pressure when you are not at a hospital or clinic, you can use: ? An automated blood pressure machine at a pharmacy. ? A home blood pressure monitor.  Talk to your health care provider about your target blood pressure.  If you are between 34-51 years old, ask your health care provider if you should take aspirin to prevent heart disease.  Have regular diabetes screenings by checking your fasting blood sugar level. ? If you are at a normal weight and have a low risk for diabetes, have this test once every three years after the age of 36. ? If you are overweight and have a high risk for diabetes, consider being tested at a younger age or more often.  A one-time screening for abdominal aortic aneurysm (AAA) by ultrasound is recommended for men aged 7-75 years who are current or former smokers. What should I know about preventing infection? Hepatitis B If you have a higher risk for hepatitis B, you should be screened for this virus. Talk with your health care provider to find out if you are at risk for hepatitis B infection. Hepatitis C Blood testing is recommended for:  Everyone born from [birth year] through 1965.  Anyone with known risk factors for hepatitis C.  Sexually Transmitted Diseases (STDs)  You should be screened each year for STDs including gonorrhea and chlamydia if: ? You are sexually active and are younger than 52 years of age. ? You are older than 52 years of age and your health care provider tells you that you are at risk for this type of infection. ? Your sexual activity has changed since you were last screened and you are at an increased risk for chlamydia or gonorrhea. Ask your  health care provider if you are at risk.  Talk with your health care provider about whether you are at high risk of being infected with HIV. Your health care provider may recommend a prescription medicine to help prevent HIV infection.  What else can I do?  Schedule regular health, dental, and eye exams.  Stay current with your vaccines (immunizations).  Do not use any tobacco products, such as cigarettes, chewing tobacco, and e-cigarettes. If you need help quitting, ask your health care provider.  Limit alcohol intake to no more than 2 drinks per day. One drink equals 12 ounces of beer, 5 ounces of wine, or 1 ounces of hard liquor.  Do not use street drugs.  Do not share needles.  Ask your health care provider for help if you need support or information about quitting drugs.  Tell your health care provider if you often feel depressed.  Tell your health care provider if you have  ever been abused or do not feel safe at home. This information is not intended to replace advice given to you by your health care provider. Make sure you discuss any questions you have with your health care provider. Document Released: 03/15/2008 Document Revised: 05/16/2016 Document Reviewed: 06/21/2015 Elsevier Interactive Patient Education  2018 Elsevier Inc.  

## 2018-09-11 ENCOUNTER — Encounter: Payer: Self-pay | Admitting: Internal Medicine

## 2018-09-16 ENCOUNTER — Ambulatory Visit: Payer: BC Managed Care – PPO | Admitting: Surgery

## 2018-09-16 ENCOUNTER — Other Ambulatory Visit: Payer: Self-pay

## 2018-09-16 ENCOUNTER — Encounter: Payer: Self-pay | Admitting: Surgery

## 2018-09-16 VITALS — BP 105/66 | HR 72 | Temp 97.7°F | Resp 16 | Ht 72.0 in | Wt 218.6 lb

## 2018-09-16 DIAGNOSIS — R2242 Localized swelling, mass and lump, left lower limb: Secondary | ICD-10-CM

## 2018-09-16 NOTE — Progress Notes (Signed)
Surgical Clinic History and Physical  Referring provider:  Kerman Passey, MD [address] [place], [place] [address]  HISTORY OF PRESENT ILLNESS (HPI):  52 y.o. male presents for evaluation of a Left thigh mass. Patient reports the mass has been present >5 years, but he expresses that he's unsure whether or not it's become larger, since he's lost nearly 100 lbs, attributed to dietary changes despite limited exercise. He does, however, say the mass has become noticeably more apparent since he lost weight. He denies any Left thigh pain, history of trauma, or fever/chills and likewise denies any CP or SOB with activities.  PAST MEDICAL HISTORY (PMH):  Past Medical History:  Diagnosis Date  . Allergy   . Diabetes mellitus without complication (HCC)   . Hyperlipidemia   . Obesity 03/14/2015  . OSA on CPAP 05/29/2017  . Sleep apnea     PAST SURGICAL HISTORY [facility]):  Past Surgical History:  Procedure Laterality Date  . ANAL FISSURE REPAIR    . tendinitis Left   . TRIGGER FINGER RELEASE    . vascectomy  08/2014    MEDICATIONS:  Prior to Admission medications   Medication Sig Start Date End Date Taking? Authorizing Provider  Alpha-Lipoic Acid 200 MG CAPS Take 200 mg by mouth 2 (two) times daily.    [provider]  Barberry-Oreg Grape-Goldenseal (BERBERINE COMPLEX PO) Take 500 mg by mouth 2 (two) times daily.    [provider]  Boron 3 MG CAPS Take 1 capsule by mouth 3 (three) times daily.    [provider]  Chromium 200 MCG CAPS Take 200 mcg by mouth 2 (two) times daily.     [provider]  Cinnamon 500 MG TABS Take 1,000 tablets by mouth 2 (two) times daily.    [provider]  Cod Liver Oil 1000 MG CAPS Take 1 capsule by mouth 2 (two) times daily.     [provider]  Coenzyme Q10 (CO Q10) 200 MG CAPS Take 200 mg by mouth daily.    [provider]  doxycycline (MONODOX) 50 MG capsule TAKE 1 CAPSULE EVERY DAY  06/27/18   Lada, Janit Bern, MD  fluticasone (FLONASE) 50 MCG/ACT nasal spray Place 2 sprays into both nostrils daily.    [provider]  levocetirizine (XYZAL) 5 MG tablet Take 5 mg by mouth every evening.  03/05/15   [provider]  losartan (COZAAR) 25 MG tablet Take 0.5 tablets (12.5 mg total) by mouth daily. 09/02/18   Kerman Passey, MD  metFORMIN (GLUCOPHAGE) 1000 MG tablet Take 0.5 tablets (500 mg total) by mouth 2 (two) times daily with a meal. 02/26/18   Lada, Janit Bern, MD  POTASSIUM GLUCONATE PO Take 1 tablet by mouth daily.    [provider]  triamcinolone cream (KENALOG) 0.1 % Apply 1 application topically 2 (two) times daily. If needed; do not use on face or in groin or under the arms 09/09/18   Lada, Janit Bern, MD  Turmeric 500 MG CAPS Take 500 mg by mouth daily.    [provider]  VASCEPA 1 g CAPS TAKE 2 CAPSULES TWICE DAILY 06/27/18   Lada, Janit Bern, MD    ALLERGIES:  Allergies  Allergen Reactions  . Benicar [Olmesartan] Other (See Comments)  . Lisinopril Other (See Comments)    SOCIAL HISTORY:  Social History   Socioeconomic History  . Marital status: Married    Spouse name: Not on file  . Number  of children: Not on file  . Years of education: Not on file  . Highest education level: Not on file  Occupational History  . Not on file  Social Needs  . Financial resource strain: Not hard at all  . Food insecurity:    Worry: Never true    Inability: Never true  . Transportation needs:    Medical: No    Non-medical: No  Tobacco Use  . Smoking status: Never Smoker  . Smokeless tobacco: Former Neurosurgeon    Types: Chew  Substance and Sexual Activity  . Alcohol use: No    Alcohol/week: 0.0 standard drinks  . Drug use: No  . Sexual activity: Yes    Partners: Female  Lifestyle  . Physical activity:    Days per week: Not on file    Minutes per session: Not on file  . Stress: Not at all  Relationships  . Social connections:     Talks on phone: Three times a week    Gets together: Three times a week    Attends religious service: Not on file    Active member of club or organization: No    Attends meetings of clubs or organizations: Never    Relationship status: Married  . Intimate partner violence:    Fear of current or ex partner: No    Emotionally abused: No    Physically abused: No    Forced sexual activity: No  Other Topics Concern  . Not on file  Social History Narrative  . Not on file    The patient currently resides (home / rehab facility / nursing home): Home The patient normally is (ambulatory / bedbound): Ambulatory  FAMILY HISTORY:  Family History  Problem Relation Age of Onset  . Diabetes Father   . Pancreatic cancer Father   . Diabetes Brother   . Diabetes Maternal Grandmother   . Breast cancer Mother   . Colon polyps Mother     Otherwise negative/non-contributory.  REVIEW OF SYSTEMS:  Constitutional: denies any other weight loss, fever, chills, or sweats  Eyes: denies any other vision changes, history of eye injury  ENT: denies sore throat, hearing problems  Respiratory: denies shortness of breath, wheezing  Cardiovascular: denies chest pain, palpitations  Gastrointestinal: denies abdominal pain, N/V, diarrhea, or constipation Musculoskeletal: denies any other joint pains or cramps  Skin: Denies any other rashes or skin discolorations except as per HPI Neurological: denies any other headache, dizziness, weakness  Psychiatric: Denies any other depression, anxiety   All other review of systems were otherwise negative   VITAL SIGNS:  BP 105/66   Pulse 72   Temp 97.7 F (36.5 C) (Temporal)   Resp 16   Ht 6' (1.829 m)   Wt 218 lb 9.6 oz (99.2 kg)   SpO2 97%   BMI 29.65 kg/m    PHYSICAL EXAM:  Constitutional:  -- Normal body habitus  -- Awake, alert, and oriented x3  Eyes:  -- Pupils equally round and reactive to light  -- No scleral icterus  Ear, nose, throat:  -- No  jugular venous distension -- No nasal drainage, bleeding Pulmonary:  -- No crackles  -- Equal breath sounds bilaterally -- Breathing non-labored at rest Cardiovascular:  -- S1, S2 present  -- No pericardial rubs  Gastrointestinal:  -- Abdomen soft, nontender, non-distended, no guarding/rebound  -- No abdominal masses appreciated, pulsatile or otherwise  Musculoskeletal and Integumentary:  -- Wounds or skin discoloration: 4 cm tall x 3 cm  wide firm immobile subcutaneous mass, tightly adherent to underlying fascia/muscle -- Extremities: B/L UE and LE FROM, hands and feet warm, no edema  Neurologic:  -- Motor function: Intact and symmetric -- Sensation: Intact and symmetric  Labs:  CBC Latest Ref Rng & Units 08/01/2016 05/23/2016 04/08/2015  WBC 3.8 - 10.8 K/uL 5.0 6.1 5.6  Hemoglobin 13.2 - 17.1 g/dL 16.115.6 09.615.7 04.516.0  Hematocrit 38.5 - 50.0 % 45.7 45.1 46.1  Platelets 140 - 400 K/uL 133(L) 145 137(L)   CMP Latest Ref Rng & Units 09/02/2018 02/25/2018 08/27/2017  Glucose 65 - 99 mg/dL 409(W166(H) 119(J144(H) 478(G183(H)  BUN 7 - 25 mg/dL 20 23 15   Creatinine 0.70 - 1.33 mg/dL 9.560.91 2.130.82 0.860.92  Sodium 135 - 146 mmol/L 140 138 138  Potassium 3.5 - 5.3 mmol/L 4.1 4.3 4.3  Chloride 98 - 110 mmol/L 103 105 103  CO2 20 - 32 mmol/L 27 25 27   Calcium 8.6 - 10.3 mg/dL 9.6 9.2 9.0  Total Protein 6.1 - 8.1 g/dL 6.5 6.8 6.3  Total Bilirubin 0.2 - 1.2 mg/dL 0.7 0.5 0.7  Alkaline Phos 40 - 115 U/L - - -  AST 10 - 35 U/L 47(H) 11 18  ALT 9 - 46 U/L 40 12 17   Imaging studies: No new pertinent imaging studies available for review at this time   Assessment/Plan:  52 y.o. male with questionably enlarged 4 cm tall x 3 cm wide firm immobile subcutaneous mass, tightly adherent to underlying fascia/muscle, complicated by co-morbidities including former obesity s/p significant recent weight loss with DM, HLD, and OSA on CPAP.   - discussed differential diagnoses  - Left femur MRI to evaluate for soft tissue malignancy  (ultrasound and CT also considered, though would likely be followed by subsequent MRI anyway)  - patient requests MRI to be scheduled for after January 1st, return to clinic following MRI  - anticipate will plan for excision of Left thigh mass pending MRI results  - instructed to call if any questions or concerns  All of the above recommendations were discussed with the patient, and all of patient's were answered to his expressed satisfaction.  Thank you for the opportunity to participate in this patient's care.  -- Scherrie GerlachJason E. Earlene Plateravis, MD, RPVI : [facility] General [facility] - Partnering for exceptional care. Office: [phone]

## 2018-09-16 NOTE — Patient Instructions (Addendum)
  We will order the MRI after October 01, 2018  and see you back in the office to discuss the results.  The patient is scheduled for an MRI left thigh on 10/06/18 at 5 pm [facility]. He will arrive by 4:30 pm. The patient is aware of date and time.

## 2018-09-17 ENCOUNTER — Encounter: Payer: Self-pay | Admitting: Surgery

## 2018-09-18 ENCOUNTER — Telehealth: Payer: Self-pay | Admitting: *Deleted

## 2018-09-18 NOTE — Telephone Encounter (Signed)
Patient called and stated that he is schedule for MRI and he has been needing one for both of his feet as well, patient didn't know if this could be done all at the same time

## 2018-09-18 NOTE — Telephone Encounter (Signed)
Called and let patient know that we could not do this because we are not treating his feet. He would have to have his foot doctor order this for him.

## 2018-09-30 ENCOUNTER — Other Ambulatory Visit: Payer: Self-pay | Admitting: Family Medicine

## 2018-09-30 NOTE — Telephone Encounter (Signed)
Lab Results  Component Value Date   CREATININE 0.91 09/02/2018   Refill request received for losartan He should not need any more losartan from me for a while I changed the instructions and sent a prescription earlier this month Please resolve with the pharmacy Thank you

## 2018-10-03 ENCOUNTER — Other Ambulatory Visit: Payer: Self-pay

## 2018-10-03 DIAGNOSIS — R740 Nonspecific elevation of levels of transaminase and lactic acid dehydrogenase [LDH]: Principal | ICD-10-CM

## 2018-10-03 DIAGNOSIS — R7401 Elevation of levels of liver transaminase levels: Secondary | ICD-10-CM

## 2018-10-04 LAB — HEPATIC FUNCTION PANEL
AG Ratio: 2.3 (calc) (ref 1.0–2.5)
ALT: 12 U/L (ref 9–46)
AST: 17 U/L (ref 10–35)
Albumin: 4.5 g/dL (ref 3.6–5.1)
Alkaline phosphatase (APISO): 32 U/L — ABNORMAL LOW (ref 40–115)
Bilirubin, Direct: 0.1 mg/dL (ref 0.0–0.2)
Globulin: 2 g/dL (calc) (ref 1.9–3.7)
Indirect Bilirubin: 0.7 mg/dL (calc) (ref 0.2–1.2)
Total Bilirubin: 0.8 mg/dL (ref 0.2–1.2)
Total Protein: 6.5 g/dL (ref 6.1–8.1)

## 2018-10-04 LAB — GAMMA GT: GGT: 14 U/L (ref 3–95)

## 2018-10-06 ENCOUNTER — Ambulatory Visit
Admission: RE | Admit: 2018-10-06 | Discharge: 2018-10-06 | Disposition: A | Discharge status: Home / Self Care | Payer: BC Managed Care – PPO | Source: Ambulatory Visit | Attending: Surgery | Admitting: Surgery

## 2018-10-06 DIAGNOSIS — R2242 Localized swelling, mass and lump, left lower limb: Secondary | ICD-10-CM | POA: Diagnosis not present

## 2018-10-06 MED ORDER — GADOBUTROL 1 MMOL/ML IV SOLN
10.0000 mL | Freq: Once | INTRAVENOUS | Status: AC | PRN
  Administered 2018-10-06: 10 mL via INTRAVENOUS

## 2018-10-09 ENCOUNTER — Encounter: Payer: Self-pay | Admitting: Surgery

## 2018-10-09 ENCOUNTER — Other Ambulatory Visit: Payer: Self-pay

## 2018-10-09 ENCOUNTER — Ambulatory Visit: Payer: BC Managed Care – PPO | Admitting: Surgery

## 2018-10-09 VITALS — BP 144/80 | HR 57 | Temp 97.7°F | Resp 16 | Ht 72.0 in | Wt 211.0 lb

## 2018-10-09 DIAGNOSIS — R2242 Localized swelling, mass and lump, left lower limb: Secondary | ICD-10-CM

## 2018-10-09 HISTORY — DX: Localized swelling, mass and lump, left lower limb: R22.42

## 2018-10-09 NOTE — Patient Instructions (Addendum)
We will send the referral to Orthopaedic Surgeon.  Someone from their office will call to schedule an appointment. If you have not heard from anyone within 5-7 days please call our office and let us know.   Please call with any questions or concerns.

## 2018-10-09 NOTE — Progress Notes (Signed)
Surgical Clinic Progress/Follow-up Note   HPI:  53 y.o. Male presents to clinic for follow-up evaluation of his Left thigh mass and to discuss results of recent Left thigh MRI. Patient again confirms the mass has been present >5 years, but reiterates he is unsure how much larger it's become, since he's recently lost nearly 100 lbs, attributed to dietary changes despite limited exercise. It does, however, appear much larger than it had previously. He otherwise denies any Left thigh pain, history of trauma, or fever/chills and likewise denies any CP or SOB with activities.  Review of Systems:  Constitutional: denies any other weight loss, fever, chills, or sweats  Eyes: denies any other vision changes, history of eye injury  ENT: denies sore throat, hearing problems  Respiratory: denies shortness of breath, wheezing  Cardiovascular: denies chest pain, palpitations  Gastrointestinal: denies abdominal pain, N/V, or diarrhea Musculoskeletal: denies any other joint pains or cramps  Skin: Denies any other rashes or skin discolorations except as per interval history Neurological: denies any other headache, dizziness, weakness  Psychiatric: denies any other depression, anxiety  All other review of systems: otherwise negative   Vital Signs:  BP (!) 144/80   Pulse (!) 57   Temp 97.7 F (36.5 C) (Tympanic)   Resp 16   Ht 6' (1.829 m)   Wt 211 lb (95.7 kg)   SpO2 98%   BMI 28.62 kg/m    Physical Exam:  Constitutional:  -- Normal body habitus  -- Awake, alert, and oriented x3  Eyes:  -- Pupils equally round and reactive to light  -- No scleral icterus  Ear, nose, throat:  -- No jugular venous distension  -- No nasal drainage, bleeding Pulmonary:  -- No crackles -- Equal breath sounds bilaterally -- Breathing non-labored at rest Cardiovascular:  -- S1, S2 present  -- No pericardial rubs  Gastrointestinal:  -- Soft, nontender, non-distended, no guarding/rebound  -- No abdominal  masses appreciated, pulsatile or otherwise  Musculoskeletal / Integumentary:  -- Wounds or skin discoloration: 4 cm tall x 3 cm wide firm immobile subcutaneous mass, tightly adherent to underlying fascia/muscle, non-tender to palpation without surrounding erythema  -- Extremities: B/L UE and LE FROM, hands and feet warm, no edema  Neurologic:  -- Motor function: intact and symmetric  -- Sensation: intact and symmetric   Laboratory studies:  CBC Latest Ref Rng & Units 08/01/2016 05/23/2016 04/08/2015  WBC 3.8 - 10.8 K/uL 5.0 6.1 5.6  Hemoglobin 13.2 - 17.1 g/dL 69.6 29.5 28.4  Hematocrit 38.5 - 50.0 % 45.7 45.1 46.1  Platelets 140 - 400 K/uL 133(L) 145 137(L)   CMP Latest Ref Rng & Units 10/03/2018 09/02/2018 02/25/2018  Glucose 65 - 99 mg/dL - 132(G) 401(U)  BUN 7 - 25 mg/dL - 20 23  Creatinine 2.72 - 1.33 mg/dL - 5.36 6.44  Sodium 034 - 146 mmol/L - 140 138  Potassium 3.5 - 5.3 mmol/L - 4.1 4.3  Chloride 98 - 110 mmol/L - 103 105  CO2 20 - 32 mmol/L - 27 25  Calcium 8.6 - 10.3 mg/dL - 9.6 9.2  Total Protein 6.1 - 8.1 g/dL 6.5 6.5 6.8  Total Bilirubin 0.2 - 1.2 mg/dL 0.8 0.7 0.5  Alkaline Phos 40 - 115 U/L - - -  AST 10 - 35 U/L 17 47(H) 11  ALT 9 - 46 U/L 12 40 12   Imaging:  Right Thigh MRI with and without Contrast (10/06/2018) A well-circumscribed intramuscular mass of the left vastus medialis  is identified along the mid thigh without underlying osseous involvement. Thin internal septations are seen within this lesion without soft tissue nodularity. Slight seepage of internal presumed myxoid contents into the adjacent soft tissues. Peripheral fat is noted along the upper and lower pole, a finding not uncommonly seen with these lesions. All palpable masses however should be assessed independent of imaging findings. The decision to biopsy such palpable lumps should be based on clinical concern. It is uncertain how much this lesion has grown in the absence of prior imaging apart from  the patient's subjective sensation that it has enlarged. Percutaneous sampling and/or surgical enucleation would be among some considerations for more definitive pathology.   Assessment:  53 y.o. yo Male with a problem list including...  Patient Active Problem List   Diagnosis Date Noted  . Preventative health care 09/09/2018  . Vitiligo 09/09/2018  . Elevated AST (SGOT) 09/05/2018  . Sprain of ankle 09/05/2017  . OSA on CPAP 05/29/2017  . Chronic sinus complaints 11/08/2016  . Low HDL (under 40) 11/08/2016  . Neck pain 08/15/2016  . 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 Cervical lymphadenopathy 07/27/2016  . 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 Medication monitoring encounter 04/20/2016  . Plantar fasciitis 09/30/2015  . Constipation by delayed colonic transit 04/25/2015  . Constipation - functional 04/11/2015  . Family history of colonic polyps 03/14/2015  . Esophagitis, reflux 03/14/2015  . H/O endocrine disorder 03/14/2015  . 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 Hypotestosteronism 03/14/2015  . Abnormal presence of protein in urine 03/14/2015  . Diabetes mellitus without complication (HCC) 06/12/2010  . Family history of malignant neoplasm of prostate 05/21/2008  . Acne 11/19/2007  . Benign essential HTN 01/29/2007  . Hyperlipidemia LDL goal <70 01/29/2007    presents to clinic for follow-up evaluation of gradually enlarged intra-muscular Left thigh mass.  Plan:   - results of Left thigh MRI discussed  - recommend referral to either orthopedic surgery or orthopedic oncology  - instructed to call office if any questions or concerns  - return to clinic as needed  All of the above recommendations were discussed with the patient, and all of patient's questions were answered to his expressed satisfaction.  -- Scherrie Gerlach Earlene Plater, MD, RPVI [place]: [facility] General Surgery - Partnering for exceptional care. Office: [phone]

## 2018-10-13 ENCOUNTER — Encounter: Payer: Self-pay | Admitting: *Deleted

## 2018-10-13 ENCOUNTER — Other Ambulatory Visit: Payer: Self-pay | Admitting: *Deleted

## 2018-10-13 DIAGNOSIS — R2242 Localized swelling, mass and lump, left lower limb: Secondary | ICD-10-CM

## 2018-10-13 NOTE — Progress Notes (Signed)
Dr. Earlene Plater recommended patient be referred to ortho vs. ortho oncology for a left thigh mass.   Patient has previously seen Dr. Hyacinth Meeker with [facility] in the past.   Dr. Earlene Plater notified and agreeable to start with ortho referral since he is an established patient at [facility].   A referral has been placed for the patient to be seen by Dr. Ashley Jacobs., [place] Cabal for today, 10-13-18 at 2 pm (arrive 1:45 pm). Per scheduler, Dr. Hyacinth Meeker will be available at time of patients appointment for the P.[facility] to discuss case with him while he is there for the appointment.  The patient is agreeable to the above and was previously notified of the appointment on 10-09-18.

## 2018-11-17 ENCOUNTER — Telehealth: Payer: Self-pay | Admitting: *Deleted

## 2018-11-17 NOTE — Telephone Encounter (Signed)
Patient called and stated that he got denied for the MRI and he wanted to check to see if it was coded correctly. He also sated that the date for the MRI that was submitted was incorrect. Please call and advise

## 2018-12-16 DIAGNOSIS — Z86018 Personal history of other benign neoplasm: Secondary | ICD-10-CM | POA: Insufficient documentation

## 2019-03-04 ENCOUNTER — Ambulatory Visit: Payer: BC Managed Care – PPO | Admitting: Family Medicine

## 2019-03-09 ENCOUNTER — Ambulatory Visit: Payer: BC Managed Care – PPO | Admitting: Family Medicine

## 2019-03-10 ENCOUNTER — Encounter: Payer: Self-pay | Admitting: Family Medicine

## 2019-03-10 ENCOUNTER — Ambulatory Visit: Payer: BC Managed Care – PPO | Admitting: Family Medicine

## 2019-03-10 ENCOUNTER — Other Ambulatory Visit: Payer: Self-pay

## 2019-03-10 VITALS — BP 134/82 | HR 87 | Temp 97.8°F | Resp 16 | Ht 72.0 in | Wt 215.0 lb

## 2019-03-10 DIAGNOSIS — K2100 Gastro-esophageal reflux disease with esophagitis, without bleeding: Secondary | ICD-10-CM

## 2019-03-10 DIAGNOSIS — E1165 Type 2 diabetes mellitus with hyperglycemia: Secondary | ICD-10-CM | POA: Insufficient documentation

## 2019-03-10 DIAGNOSIS — Z9989 Dependence on other enabling machines and devices: Secondary | ICD-10-CM

## 2019-03-10 DIAGNOSIS — E668 Other obesity: Secondary | ICD-10-CM

## 2019-03-10 DIAGNOSIS — E785 Hyperlipidemia, unspecified: Secondary | ICD-10-CM

## 2019-03-10 DIAGNOSIS — G4733 Obstructive sleep apnea (adult) (pediatric): Secondary | ICD-10-CM

## 2019-03-10 DIAGNOSIS — R740 Nonspecific elevation of levels of transaminase and lactic acid dehydrogenase [LDH]: Secondary | ICD-10-CM | POA: Diagnosis not present

## 2019-03-10 DIAGNOSIS — Z86018 Personal history of other benign neoplasm: Secondary | ICD-10-CM

## 2019-03-10 DIAGNOSIS — R7401 Elevation of levels of liver transaminase levels: Secondary | ICD-10-CM

## 2019-03-10 DIAGNOSIS — M4302 Spondylolysis, cervical region: Secondary | ICD-10-CM

## 2019-03-10 DIAGNOSIS — E786 Lipoprotein deficiency: Secondary | ICD-10-CM

## 2019-03-10 NOTE — Patient Instructions (Signed)
Please schedule your Eye examination ASAP.

## 2019-03-10 NOTE — Progress Notes (Signed)
Name: Ryan Ritter   MRN: [id number]    DOB: January 27, 1966   Date:03/10/2019       Progress Note  Subjective  Chief Complaint  Chief Complaint  Patient presents with  . Diabetes    follow up  . Hypertension    HPI  Obesity: Doing Keto and intermittent fasting over the last 1.5 years.  He is not exercising since COVID-19 pandemic due to gym closure; does walk the dog and does [employer] work.  He is congratulated on his progress of weight loss.  Highest weight is 299lbs. Body mass index is 29.16 kg/m.  OSA: Wearing CPAP nightly. Does not go to bed early enough, but sleeps well once he is in bed and asleep.  GERD: No issues since doing Keto.  Has been asymptomatic. No abdominal pain, difficulty swallowing, nausea, or vomiting, or regurgitation.  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  He is doing well after removal. No additional routine monitoring. Biopsy showed benign.   Diabetes mellitus type 2 Checking sugars?  yes How often? A few times a week Range (low to high) over last two weeks:  120's-140's Trying to limit white bread, white rice, white potatoes, sweets?  yes Trying to limit sweetened drinks like iced tea, soft drinks, sports drinks, fruit juices?  yes Checking feet every day/night?  yes Last eye exam:  Overdue and is reminded of this, Denies: Polyuria, polydipsia, polyphagia, vision changes, or neuropathy.  Most recent A1C:  Lab Results  Component Value Date   HGBA1C 5.8 (H) 09/02/2018    We will recheck today. Last CMP Results : is due for repeat today    Component Value Date/Time   NA 140 09/02/2018 0819   NA 135 04/26/2016 0822   K 4.1 09/02/2018 0819   CL 103 09/02/2018 0819   CO2 27 09/02/2018 0819   GLUCOSE 166 (H) 09/02/2018 0819   BUN 20 09/02/2018 0819   BUN 16 04/26/2016 0822   CREATININE 0.91 09/02/2018 0819   CALCIUM 9.6 09/02/2018 0819   PROT 6.5 10/03/2018 1157   PROT 6.4 04/26/2016 0822   ALBUMIN 4.3 02/21/2017  0837   ALBUMIN 4.2 04/26/2016 0822   AST 17 10/03/2018 1157   ALT 12 10/03/2018 1157   ALKPHOS 27 (L) 02/21/2017 0837   BILITOT 0.8 10/03/2018 1157   BILITOT 0.7 04/26/2016 0822   GFRNONAA 97 09/02/2018 0819   GFRAA 112 09/02/2018 0819  Urine Micro UTD? Yes Current Medication Management: Diabetic Medications: Metformin 500mg  BID. ACEI/ARB: Yes - for kidney protection only, has not had elevated BP.  Statin: No Aspirin therapy: No  HLD: Has hx elevated TGD's; doing well on Vascepa BID.  About a year ago he changed his diet to be more ketogenic - lost 50lbs and his HDL came up to 43.  His last LDL was 100, advised would like him to be <70, but he does not want to take statin therapy (thinks he had some memory issues)  Hx elevated AST: We will check labs today; has made drastic dietary changes and has lost about 50lbs on Keto diet over the last 1.5 years.   Hypogonadism: Has history of low testosterone.  He has some fatigue, but states does not sleep like he should either.  Used to utilize androgel but it became too expensive.  Last testing 09/02/2018 was low end of normal. Denies ED.   Spondylosis Cervical Spine: Still going to chiropractor and massage therapist monthly (when able  to go prior to COVID-19 pandemic).  Pain is controlled.   Patient Active Problem List   Diagnosis Date Noted  . Type 2 diabetes mellitus with hyperglycemia, without long-term current use of insulin (HCC) 03/10/2019  . Mass of left thigh 10/09/2018  . Preventative health care 09/09/2018  . Vitiligo 09/09/2018  . Elevated AST (SGOT) 09/05/2018  . OSA on CPAP 05/29/2017  . Low HDL (under 40) 11/08/2016  . Neck pain 08/15/2016  . 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 Cervical lymphadenopathy 07/27/2016  . 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 Medication monitoring encounter 04/20/2016  . Plantar fasciitis 09/30/2015  . Constipation by delayed colonic transit 04/25/2015  . Family history of colonic  polyps 03/14/2015  . Esophagitis, reflux 03/14/2015  . H/O endocrine disorder 03/14/2015  . 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 Hypotestosteronism 03/14/2015  . Abnormal presence of protein in urine 03/14/2015  . Diabetes mellitus without complication (HCC) 06/12/2010  . Family history of malignant neoplasm of prostate 05/21/2008  . Acne 11/19/2007  . Hyperlipidemia LDL goal <70 01/29/2007    Past Surgical History:  Procedure Laterality Date  . ANAL FISSURE REPAIR    . tendinitis Left   . TRIGGER FINGER RELEASE    . vascectomy  08/2014    Family History  Problem Relation Age of Onset  . Diabetes Father   . Pancreatic cancer Father   . Diabetes Brother   . Diabetes Maternal Grandmother   . Breast cancer Mother   . Colon polyps Mother     Social History   Socioeconomic History  . Marital status: Married    Spouse name: Not on file  . Number of children: Not on file  . Years of education: Not on file  . Highest education level: Not on file  Occupational History  . Not on file  Social Needs  . Financial resource strain: Not hard at all  . Food insecurity:    Worry: Never true    Inability: Never true  . Transportation needs:    Medical: No    Non-medical: No  Tobacco Use  . Smoking status: Never Smoker  . Smokeless tobacco: Former NeurosurgeonUser    Types: Chew  Substance and Sexual Activity  . Alcohol use: No    Alcohol/week: 0.0 standard drinks  . Drug use: No  . Sexual activity: Yes    Partners: Female  Lifestyle  . Physical activity:    Days per week: Not on file    Minutes per session: Not on file  . Stress: Not at all  Relationships  . Social connections:    Talks on phone: Three times a week    Gets together: Three times a week    Attends religious service: Not on file    Active member of club or organization: No    Attends meetings of clubs or organizations: Never    Relationship status: Married  . Intimate partner violence:    Fear of current or ex  partner: No    Emotionally abused: No    Physically abused: No    Forced sexual activity: No  Other Topics Concern  . Not on file  Social History Narrative  . Not on file     Current Outpatient Medications:  .  Alpha-Lipoic Acid 200 MG CAPS, Take 200 mg by mouth 2 (two) times daily., Disp: , Rfl:  .  Barberry-Oreg Grape-Goldenseal (BERBERINE COMPLEX PO), Take 500 mg by mouth 2 (two) times daily., Disp: , Rfl:  .  Boron 3 MG CAPS, Take 1 capsule by mouth 3 (three) times daily., Disp: , Rfl:  .  Chromium 200 MCG CAPS, Take 200 mcg by mouth 2 (two) times daily. , Disp: , Rfl:  .  Cinnamon 500 MG TABS, Take 1,000 tablets by mouth 2 (two) times daily., Disp: , Rfl:  .  Cod Liver Oil 1000 MG CAPS, Take 1 capsule by mouth 2 (two) times daily. , Disp: , Rfl:  .  Coenzyme Q10 (CO Q10) 200 MG CAPS, Take 200 mg by mouth daily., Disp: , Rfl:  .  doxycycline (MONODOX) 50 MG capsule, TAKE 1 CAPSULE EVERY DAY, Disp: 30 capsule, Rfl: 5 .  EPINEPHrine 0.3 mg/0.3 mL IJ SOAJ injection, epinephrine 0.3 mg/0.3 mL injection, auto-injector, Disp: , Rfl:  .  fluticasone (FLONASE) 50 MCG/ACT nasal spray, Place 2 sprays into both nostrils daily., Disp: , Rfl:  .  levocetirizine (XYZAL) 5 MG tablet, Take 5 mg by mouth every evening. , Disp: , Rfl:  .  losartan (COZAAR) 25 MG tablet, Take 0.5 tablets (12.5 mg total) by mouth daily., Disp: 15 tablet, Rfl: 11 .  metFORMIN (GLUCOPHAGE) 1000 MG tablet, TAKE 1/2 TABLET BY MOUTH TWICE DAILY WITH A MEAL, Disp: 90 tablet, Rfl: 1 .  POTASSIUM GLUCONATE PO, Take 1 tablet by mouth daily., Disp: , Rfl:  .  triamcinolone cream (KENALOG) 0.1 %, Apply 1 application topically 2 (two) times daily. If needed; do not use on face or in groin or under the arms, Disp: 30 g, Rfl: 2 .  VASCEPA 1 g CAPS, TAKE 2 CAPSULES TWICE DAILY, Disp: 120 capsule, Rfl: 5 .  Turmeric 500 MG CAPS, Take 500 mg by mouth daily., Disp: , Rfl:   Allergies  Allergen Reactions  . Benicar [Olmesartan] Other  (See Comments)  . Lisinopril Other (See Comments)    I personally reviewed active problem list, medication list, allergies, health maintenance, notes from last encounter, lab results with the patient/caregiver today.   ROS  Constitutional: Negative for fever or weight change.  Respiratory: Negative for cough and shortness of breath.   Cardiovascular: Negative for chest pain or palpitations.  Gastrointestinal: Negative for abdominal pain, no bowel changes.  Musculoskeletal: Negative for gait problem or joint swelling.  Skin: Negative for rash.  Neurological: Negative for dizziness or headache.  No other specific complaints in a complete review of systems (except as listed in HPI above).  Objective  Vitals:   03/10/19 0754  BP: 134/82  Pulse: 87  Resp: 16  Temp: 97.8 F (36.6 C)  TempSrc: Oral  SpO2: 98%  Weight: 215 lb (97.5 kg)  Height: 6' (1.829 m)    Body mass index is 29.16 kg/m.  Physical Exam Constitutional: Patient appears well-developed and well-nourished. No distress.  HENT: Head: Normocephalic and atraumatic. Eyes: Conjunctivae and EOM are normal. No scleral icterus. Neck: Normal range of motion. Neck supple. No JVD present. No thyromegaly present.  Cardiovascular: Normal rate, regular rhythm and normal heart sounds.  No murmur heard. No BLE edema. Pulmonary/Chest: Effort normal and breath sounds normal. No respiratory distress. Musculoskeletal: Normal range of motion, no joint effusions. No gross deformities Neurological: Pt is alert and oriented to person, place, and time. No cranial nerve deficit. Coordination, balance, strength, speech and gait are normal.  Skin: Skin is warm and dry. No rash noted. No erythema.  Psychiatric: Patient has a normal mood and affect. behavior is normal. Judgment and thought content normal.  No results found for this or any  previous visit (from the past 72 hour(s)).  PHQ2/9: [facility] screen [facility] 03/10/2019 09/09/2018  09/02/2018 01/03/2018 12/06/2017  Decreased Interest 0 0 0 0 0  Down, Depressed, Hopeless 0 0 0 0 0  PHQ - 2 Score 0 0 0 0 0  Altered sleeping 0 0 0 - -  Tired, decreased energy 0 0 0 - -  Change in appetite 0 0 0 - -  Feeling bad or failure about yourself  0 0 0 - -  Trouble concentrating 0 0 0 - -  Moving slowly or fidgety/restless 0 0 0 - -  Suicidal thoughts 0 0 0 - -  PHQ-9 Score 0 0 0 - -  Difficult doing work/chores Not difficult at all Not difficult at all Not difficult at all - -   PHQ-2/9 Result is negative.    Fall Risk: Fall Risk  03/10/2019 10/09/2018 09/16/2018 09/09/2018 09/02/2018  Falls in the past year? 0 0 0 0 0  Number falls in past yr: 0 - - - -  Injury with Fall? 0 - - - -  Follow up Falls evaluation completed - - - -    Assessment & Plan  1. Type 2 diabetes mellitus with hyperglycemia, without long-term current use of insulin (HCC) - Doing well on Keto diet and metformin. - Hemoglobin A1c - COMPLETE METABOLIC PANEL WITH GFR  2. OSA on CPAP - Using CPAP nightly  3. Esophagitis, reflux - Stable with diet changes  4. Elevated AST (SGOT) CMP per orders  5. Hypogonadal obesity - Last check in December 2019 showed low end of normal testosterone.  He has had dramatic weight loss and I suspect these levels are a result of that.  Encouraged him to continue and we can recheck at the 1 year mark.  6. Spondylolysis of cervical region - Doing well, having occasional pain, but tolerable.  7. History of myxoma - No additional follow up.  8. Low HDL (under 40) - Lipid panel - Does not want statin therapy  9. Hyperlipidemia LDL goal <70 - Lipid panel - Does not want statin therapy. - Encouraged lower fat keto diet to help with lipids.

## 2019-03-11 LAB — COMPLETE METABOLIC PANEL WITH GFR
AG Ratio: 2.3 (calc) (ref 1.0–2.5)
ALT: 9 U/L (ref 9–46)
AST: 11 U/L (ref 10–35)
Albumin: 4.6 g/dL (ref 3.6–5.1)
Alkaline phosphatase (APISO): 36 U/L (ref 35–144)
BUN: 19 mg/dL (ref 7–25)
CO2: 27 mmol/L (ref 20–32)
Calcium: 9.3 mg/dL (ref 8.6–10.3)
Chloride: 104 mmol/L (ref 98–110)
Creat: 0.78 mg/dL (ref 0.70–1.33)
GFR, Est African American: 119 mL/min/{1.73_m2} (ref >=60)
GFR, Est Non African American: 103 mL/min/{1.73_m2} (ref >=60)
Globulin: 2 g/dL (calc) (ref 1.9–3.7)
Glucose, Bld: 181 mg/dL — ABNORMAL HIGH (ref 65–99)
Potassium: 4.4 mmol/L (ref 3.5–5.3)
Sodium: 140 mmol/L (ref 135–146)
Total Bilirubin: 0.8 mg/dL (ref 0.2–1.2)
Total Protein: 6.6 g/dL (ref 6.1–8.1)

## 2019-03-11 LAB — HEMOGLOBIN A1C
Hgb A1c MFr Bld: 6.2 % of total Hgb — ABNORMAL HIGH (ref <5.7)
Mean Plasma Glucose: 131 (calc)
eAG (mmol/L): 7.3 (calc)

## 2019-03-11 LAB — LIPID PANEL
Cholesterol: 187 mg/dL (ref <200)
HDL: 45 mg/dL (ref >=40)
LDL Cholesterol (Calc): 123 mg/dL (calc) — ABNORMAL HIGH
Non-HDL Cholesterol (Calc): 142 mg/dL (calc) — ABNORMAL HIGH (ref <130)
Total CHOL/HDL Ratio: 4.2 (calc) (ref <5.0)
Triglycerides: 87 mg/dL (ref <150)

## 2019-03-28 ENCOUNTER — Other Ambulatory Visit: Payer: Self-pay | Admitting: Family Medicine

## 2019-04-30 ENCOUNTER — Other Ambulatory Visit: Payer: Self-pay | Admitting: Family Medicine

## 2019-05-13 ENCOUNTER — Ambulatory Visit: Payer: BC Managed Care – PPO | Admitting: Podiatry

## 2019-05-13 ENCOUNTER — Other Ambulatory Visit: Payer: Self-pay

## 2019-05-13 ENCOUNTER — Ambulatory Visit (INDEPENDENT_AMBULATORY_CARE_PROVIDER_SITE_OTHER): Payer: BC Managed Care – PPO

## 2019-05-13 ENCOUNTER — Encounter: Payer: Self-pay | Admitting: Podiatry

## 2019-05-13 VITALS — Temp 97.9°F

## 2019-05-13 DIAGNOSIS — M779 Enthesopathy, unspecified: Secondary | ICD-10-CM

## 2019-05-13 DIAGNOSIS — M722 Plantar fascial fibromatosis: Secondary | ICD-10-CM | POA: Diagnosis not present

## 2019-05-13 DIAGNOSIS — M778 Other enthesopathies, not elsewhere classified: Secondary | ICD-10-CM

## 2019-05-13 NOTE — Progress Notes (Signed)
He presents today for bilateral foot pain times the past several years on and off.  He states that the majority of the pains in the lateral aspect of the foot and the medial heel times the past couple of weeks and the right seems to be worse he says.  He said it feels like him walking on a bruise.  He states that he has been stretching to help alleviate some of the discomfort but it feels like the foot is unstable and extremely painful every time he is on them.  He has had orthotics and shockwave in the past and continues to purchase new orthotics every year without relief.  He denies any changes in his past medical history medications or allergies.  Review of systems denies fever chills nausea vomiting muscle aches pains calf pain back pain chest pain shortness of breath.  Radiographs demonstrate no acute findings.  Chronic proximal plantar fasciitis possible tears of the plantar fascia.  Some calcification within the plantar fascial.  Assessment chronic intractable plantar fasciitis cannot rule out a tear of the plantar fascia pes cavus is noted also bilaterally.  Plan: Discussed etiology pathology conservative versus surgical therapies.  At this point I had new orthotics made for him today I provided injections to the bilateral heel with 20 mg Kenalog 5 mg of Marcaine after sterile Betadine skin prep.  Tolerated procedure well.  Also will request MRI since this is been going on for years to see if this is a surgical candidate.

## 2019-06-02 ENCOUNTER — Telehealth: Payer: Self-pay

## 2019-06-02 DIAGNOSIS — M722 Plantar fascial fibromatosis: Secondary | ICD-10-CM

## 2019-06-02 NOTE — Telephone Encounter (Signed)
MRI has been approved for left foot from 06/02/2019 to 11/28/2019  Auth# [id number]   Patient has been notified via voice mail and instructed to call scheduling and set up appt to his convenience

## 2019-06-02 NOTE — Telephone Encounter (Signed)
-----   Message from Garrel Ridgel, [place] sent at 05/28/2019  4:22 PM EDT ----- Cancel it and start over with just one.  They are not going to cover the B/L MRI.  Pick the foot that hurts worse. ----- Message ----- From: Mack Hook, LPN Sent: 2/44/0102   2:50 PM EDT To: Garrel Ridgel, DPM  Please call [facility] for peer to peer for bilat LE MRI (foot).  They denied authorization due to requesting bilat LE.  I tried to give more information through nursing line, but because it is a bilat request, they need peer to peer from physician Please call [phone] option 1, then 1, then 2 CPT 73718 for both feet Dx code T14.8XXA  Please let me know when approved.   Thanks!

## 2019-06-16 ENCOUNTER — Telehealth: Payer: Self-pay | Admitting: Podiatry

## 2019-06-16 NOTE — Telephone Encounter (Signed)
Patient called about the MRI that was ordered for him. It was only ordered for the heel and he said he would like a whole foot MRI. His whole foot is hurting. Please call the patient.

## 2019-06-19 ENCOUNTER — Other Ambulatory Visit: Payer: Self-pay

## 2019-06-19 ENCOUNTER — Ambulatory Visit
Admission: RE | Admit: 2019-06-19 | Discharge: 2019-06-19 | Disposition: A | Discharge status: Home / Self Care | Payer: BC Managed Care – PPO | Source: Ambulatory Visit | Attending: Podiatry | Admitting: Podiatry

## 2019-06-19 DIAGNOSIS — M722 Plantar fascial fibromatosis: Secondary | ICD-10-CM

## 2019-06-25 ENCOUNTER — Telehealth: Payer: Self-pay

## 2019-06-25 NOTE — Telephone Encounter (Signed)
Request for MRI disc was faxed to [facility] on 06/22/2019   Patient has been notified of delay

## 2019-06-25 NOTE — Telephone Encounter (Signed)
-----   Message from Valery D O'Connell, RN sent at 06/23/2019 10:32 AM EDT ----- Angie, please assist pt. Valery ----- Message ----- From: Hyatt, Max T, DPM Sent: 06/23/2019   7:52 AM EDT To: Valery D O'Connell, RN  Send for over read and inform patient of the delay please.  

## 2019-07-23 ENCOUNTER — Telehealth: Payer: Self-pay

## 2019-07-23 NOTE — Telephone Encounter (Signed)
-----   Message from Andres Ege, RN sent at 06/23/2019 10:32 AM EDT ----- Janace Hoard, please assist pt. Marcy Siren ----- Message ----- From: Garrel Ridgel, [place] Sent: 06/23/2019   7:52 AM EDT To: Andres Ege, RN  Send for over read and inform patient of the delay please.

## 2019-07-23 NOTE — Telephone Encounter (Signed)
We Never received MRI disc that was requested from 06/22/2019.  I picked up disc from [facility] and mailed to [facility]

## 2019-08-06 ENCOUNTER — Encounter: Payer: Self-pay | Admitting: Podiatry

## 2019-08-12 ENCOUNTER — Encounter: Payer: Self-pay | Admitting: Podiatry

## 2019-08-12 ENCOUNTER — Other Ambulatory Visit: Payer: Self-pay

## 2019-08-12 ENCOUNTER — Ambulatory Visit (INDEPENDENT_AMBULATORY_CARE_PROVIDER_SITE_OTHER): Payer: BC Managed Care – PPO | Admitting: Podiatry

## 2019-08-12 DIAGNOSIS — M779 Enthesopathy, unspecified: Secondary | ICD-10-CM

## 2019-08-12 DIAGNOSIS — M778 Other enthesopathies, not elsewhere classified: Secondary | ICD-10-CM | POA: Diagnosis not present

## 2019-08-12 DIAGNOSIS — M722 Plantar fascial fibromatosis: Secondary | ICD-10-CM | POA: Diagnosis not present

## 2019-08-14 ENCOUNTER — Encounter: Payer: Self-pay | Admitting: Podiatry

## 2019-08-14 NOTE — Progress Notes (Signed)
Subjective:  Patient ID: Ryan Ritter, male    DOB: 14-Oct-1965,  MRN: [id number]  Chief Complaint  Patient presents with  . Foot Pain    Discuss MRI    53 y.o. male presents with the above complaint.  He presents with chronic plantar fasciitis of the left foot secondary to pes cavus deformity bilaterally.  Patient is here today complaining of left foot pain which has not helped.  He also states that it is not getting any better he wanted to discuss the MRI results.  Patient also has a secondary complaint of left fifth MPJ capsulitis versus tendinitis.  He states that this pain started after the diagnosis of plantar fasciitis.  I believe this may have been due to competent supportive mechanisms.  He does not recall how long this has been going on.  He denies any other acute complaints.   Review of Systems: Negative except as noted in the HPI. Denies N/V/F/Ch.  Past Medical History:  Diagnosis Date  . Allergy   . Benign essential HTN 01/29/2007  . Diabetes mellitus without complication (HCC)   . Hyperlipidemia   . Mass of left thigh 10/09/2018  . Obesity 03/14/2015  . OSA on CPAP 05/29/2017  . Plantar fasciitis 09/30/2015  . Sleep apnea     Current Outpatient Medications:  .  Alpha-Lipoic Acid 200 MG CAPS, Take 200 mg by mouth 2 (two) times daily., Disp: , Rfl:  .  Barberry-Oreg Grape-Goldenseal (BERBERINE COMPLEX PO), Take 500 mg by mouth 2 (two) times daily., Disp: , Rfl:  .  Boron 3 MG CAPS, Take 1 capsule by mouth 3 (three) times daily., Disp: , Rfl:  .  Chromium 200 MCG CAPS, Take 200 mcg by mouth 2 (two) times daily. , Disp: , Rfl:  .  Cinnamon 500 MG TABS, Take 1,000 tablets by mouth 2 (two) times daily., Disp: , Rfl:  .  Cod Liver Oil 1000 MG CAPS, Take 1 capsule by mouth 2 (two) times daily. , Disp: , Rfl:  .  Coenzyme Q10 (CO Q10) 200 MG CAPS, Take 200 mg by mouth daily., Disp: , Rfl:  .  doxycycline (MONODOX) 50 MG capsule, TAKE 1 CAPSULE BY MOUTH EVERY DAY, Disp: 30 capsule,  Rfl: 5 .  EPINEPHrine 0.3 mg/0.3 mL IJ SOAJ injection, epinephrine 0.3 mg/0.3 mL injection, auto-injector, Disp: , Rfl:  .  fluticasone (FLONASE) 50 MCG/ACT nasal spray, Place 2 sprays into both nostrils daily., Disp: , Rfl:  .  levocetirizine (XYZAL) 5 MG tablet, Take 5 mg by mouth every evening. , Disp: , Rfl:  .  losartan (COZAAR) 25 MG tablet, Take 0.5 tablets (12.5 mg total) by mouth daily., Disp: 15 tablet, Rfl: 11 .  metFORMIN (GLUCOPHAGE) 1000 MG tablet, TAKE ONE-HALF TABLET BY MOUTH TWICE DAILY WITH A MEAL, Disp: 90 tablet, Rfl: 1 .  POTASSIUM GLUCONATE PO, Take 1 tablet by mouth daily., Disp: , Rfl:  .  triamcinolone cream (KENALOG) 0.1 %, Apply 1 application topically 2 (two) times daily. If needed; do not use on face or in groin or under the arms, Disp: 30 g, Rfl: 2 .  Turmeric 500 MG CAPS, Take 500 mg by mouth daily., Disp: , Rfl:  .  VASCEPA 1 g CAPS, TAKE 2 CAPSULES BY MOUTH TWICE DAILY, Disp: 120 capsule, Rfl: 5  Social History   Tobacco Use  Smoking Status Never Smoker  Smokeless Tobacco Former Neurosurgeon  . Types: Chew    Allergies  Allergen Reactions  . Benicar [  Olmesartan] Other (See Comments)  . Lisinopril Other (See Comments)   Objective:  There were no vitals filed for this visit. There is no height or weight on file to calculate BMI. Constitutional Well developed. Well nourished.  Vascular Dorsalis pedis pulses palpable bilaterally. Posterior tibial pulses palpable bilaterally. Capillary refill normal to all digits.  No cyanosis or clubbing noted. Pedal hair growth normal.  Neurologic Normal speech. Oriented to person, place, and time. Epicritic sensation to light touch grossly present bilaterally.  Dermatologic Nails well groomed and normal in appearance. No open wounds. No skin lesions.  Orthopedic:  Pain on palpation to the plantar aspect of the left foot at the level of the heel.  No pain on lateral calcaneal squeeze test.  No pain at the Achilles  tendinitis ATFL peroneal tendon or posterior tibial tendon.  Pain on palpation to the left fifth metatarsal base.  Pain on forced/active/passive eversion and inversion of the foot.  No pain along the course of the peroneal tendon.  Pain with the range of motion of the fifth metatarsal TMT J joint.   Radiographs: None Assessment:  No diagnosis found. Plan:  Patient was evaluated and treated and all questions answered.  Left plantar fasciitis chronic -MRI was reviewed and discussed in detail with the patient.  And over read service was initiated to give [name] a further detail into the foot.  The results came back as there is a plantar calcaneal heel spur, minimal stress reaction within the posterior inferior aspect of calcaneus adjacent to plantar fascial origin.  And minimal plantar fasciitis without a plantar fascial tear.  However there is a moderate severity fatty infiltration of the abductor digiti minimi muscle suggestive of Baxters neuropathy which can be seen in plantar fasciitis. -Patient will see Dr. Milinda Pointer in 1 week to schedule for surgery of the left plantar fasciitis.  I will leave the details of the surgery up to the discretion of Dr. Milinda Pointer.   Left peroneal tendinitis versus capsulitis of the fifth TMT J joint -A steroid injection was performed at left fifth tarsometatarsal joint using 1% plain Lidocaine and 10 mg of Kenalog. This was well tolerated. -I believe this is compensated Tory pain from the left plantar fasciitis.  I believe the patient is unconsciously guarding from the pain from the plantar fasciitis and icing excruciating amount stress to the peroneal tendon versus the fifth TMT J joint.  No follow-ups on file.

## 2019-08-19 ENCOUNTER — Ambulatory Visit (INDEPENDENT_AMBULATORY_CARE_PROVIDER_SITE_OTHER): Payer: BC Managed Care – PPO | Admitting: Podiatry

## 2019-08-19 ENCOUNTER — Other Ambulatory Visit: Payer: Self-pay

## 2019-08-19 ENCOUNTER — Encounter: Payer: Self-pay | Admitting: Podiatry

## 2019-08-19 DIAGNOSIS — M722 Plantar fascial fibromatosis: Secondary | ICD-10-CM

## 2019-08-19 NOTE — Patient Instructions (Signed)
Pre-Operative Instructions  Congratulations, you have decided to take an important step towards improving your quality of life.  You can be assured that the doctors and staff at [facility] will be with you every step of the way.  Here are some important things you should know:  1. Plan to be at the surgery center/hospital at least 1 (one) hour prior to your scheduled time, unless otherwise directed by the surgical center/hospital staff.  You must have a responsible adult accompany you, remain during the surgery and drive you home.  Make sure you have directions to the surgical center/hospital to ensure you arrive on time. 2. If you are having surgery at [facility] or [facility] hospitals, you will need a copy of your medical history and physical form from your family physician within one month prior to the date of surgery. We will give you a form for your primary physician to complete.  3. We make every effort to accommodate the date you request for surgery.  However, there are times where surgery dates or times have to be moved.  We will contact you as soon as possible if a change in schedule is required.   4. No aspirin/ibuprofen for one week before surgery.  If you are on aspirin, any non-steroidal anti-inflammatory medications (Mobic, Aleve, Ibuprofen) should not be taken seven (7) days prior to your surgery.  You make take Tylenol for pain prior to surgery.  5. Medications - If you are taking daily heart and blood pressure medications, seizure, reflux, allergy, asthma, anxiety, pain or diabetes medications, make sure you notify the surgery center/hospital before the day of surgery so they can tell you which medications you should take or avoid the day of surgery. 6. No food or drink after midnight the night before surgery unless directed otherwise by surgical center/hospital staff. 7. No alcoholic beverages 24-hours prior to surgery.  No smoking 24-hours prior or 24-hours after  surgery. 8. Wear loose pants or shorts. They should be loose enough to fit over bandages, boots, and casts. 9. Don't wear slip-on shoes. Sneakers are preferred. 10. Bring your boot with you to the surgery center/hospital.  Also bring crutches or a walker if your physician has prescribed it for you.  If you do not have this equipment, it will be provided for you after surgery. 11. If you have not been contacted by the surgery center/hospital by the day before your surgery, call to confirm the date and time of your surgery. 12. Leave-time from work may vary depending on the type of surgery you have.  Appropriate arrangements should be made prior to surgery with your employer. 13. Prescriptions will be provided immediately following surgery by your doctor.  Fill these as soon as possible after surgery and take the medication as directed. Pain medications will not be refilled on weekends and must be approved by the doctor. 14. Remove nail polish on the operative foot and avoid getting pedicures prior to surgery. 15. Wash the night before surgery.  The night before surgery wash the foot and leg well with water and the antibacterial soap provided. Be sure to pay special attention to beneath the toenails and in between the toes.  Wash for at least three (3) minutes. Rinse thoroughly with water and dry well with a towel.  Perform this wash unless told not to do so by your physician.  Enclosed: 1 Ice pack (please put in freezer the night before surgery)   1 Hibiclens skin cleaner     Pre-op instructions  If you have any questions regarding the instructions, please do not hesitate to call our office.  [place]: [address], [place], [place] [address] -- [phone]  [place]: [address] [place], [place] [address] -- [phone]  [place]: [address]  [place], [place] [address] -- [phone]   Website: [email or web address] 

## 2019-08-19 NOTE — Progress Notes (Signed)
He presents today to discuss surgery to his left foot.  States that really has not gotten any better and is affecting his ability to perform his daily activities.  He saw Dr. Posey Pronto last week who discussed MRI to some degree.  And would like to discuss it further today prior to going to surgery.  ROS: Denies fever chills nausea vomiting muscle aches pains calf pain back pain chest pain shortness of breath and headache.  Objective: Vital signs are stable he is alert and oriented x3 I have reviewed his past medical history medications allergies surgery social history and review of systems.  Discussed in great detail today the findings of plantar fasciitis and the incidental finding of a peroneus brevis tendon tear all in the left foot.  We discussed these possible surgical repairs today he understands and is amenable to it.  Assessment: Chronic proximal plantar fasciitis left.  And per MRI 3 cm tear of the peroneus brevis tendon.  Plan: We discussed etiology pathology conservative versus surgical therapies.  At this point we consented him for an endoscopic plantar fasciotomy medial band of the plantar fascia left foot.  Also consented him for a repair of the peroneus brevis tendon.  I answered all of the questions regarding this procedure to the best of my ability in layman's terms he understands and is amenable to it signed all 3 pages of the consent form and I will follow-up with him in the near future for surgical intervention.  Dispensed information regarding the surgery center today as well as the anesthesia group and instructions for the morning of surgery.  Also dispensed a Cam walker follow-up with him at that time.

## 2019-08-21 ENCOUNTER — Telehealth: Payer: Self-pay | Admitting: Podiatry

## 2019-08-21 ENCOUNTER — Encounter: Payer: Self-pay | Admitting: Primary Care

## 2019-08-21 ENCOUNTER — Ambulatory Visit (INDEPENDENT_AMBULATORY_CARE_PROVIDER_SITE_OTHER): Payer: BC Managed Care – PPO | Admitting: Primary Care

## 2019-08-21 ENCOUNTER — Other Ambulatory Visit: Payer: Self-pay

## 2019-08-21 DIAGNOSIS — Z9989 Dependence on other enabling machines and devices: Secondary | ICD-10-CM

## 2019-08-21 DIAGNOSIS — G4733 Obstructive sleep apnea (adult) (pediatric): Secondary | ICD-10-CM

## 2019-08-21 NOTE — Patient Instructions (Signed)
Pleasure meeting you today Ryan Ritter  Continue CPAP every night for 4-6 hours or more  Do not drive if experiencing excessive daytime fatigue or somnolence  Renew CPAP supplies with [employer]  Follow-up in 1 year with Dr. Mortimer Fries or Dr. Halford Chessman in [place]    CPAP and BPAP Information CPAP and BPAP are methods of helping a person breathe with the use of air pressure. CPAP stands for "continuous positive airway pressure." BPAP stands for "bi-level positive airway pressure." In both methods, air is blown through your nose or mouth and into your air passages to help you breathe well. CPAP and BPAP use different amounts of pressure to blow air. With CPAP, the amount of pressure stays the same while you breathe in and out. With BPAP, the amount of pressure is increased when you breathe in (inhale) so that you can take larger breaths. Your health care provider will recommend whether CPAP or BPAP would be more helpful for you. Why are CPAP and BPAP treatments used? CPAP or BPAP can be helpful if you have:  Sleep apnea.  Chronic obstructive pulmonary disease (COPD).  Heart failure.  Medical conditions that weaken the muscles of the chest including muscular dystrophy, or neurological diseases such as amyotrophic lateral sclerosis (ALS).  Other problems that cause breathing to be weak, abnormal, or difficult. CPAP is most commonly used for obstructive sleep apnea (OSA) to keep the airways from collapsing when the muscles relax during sleep. How is CPAP or BPAP administered? Both CPAP and BPAP are provided by a small machine with a flexible plastic tube that attaches to a plastic mask. You wear the mask. Air is blown through the mask into your nose or mouth. The amount of pressure that is used to blow the air can be adjusted on the machine. Your health care provider will determine the pressure setting that should be used based on your individual needs. When should CPAP or BPAP be used? In most cases, the  mask only needs to be worn during sleep. Generally, the mask needs to be worn throughout the night and during any daytime naps. People with certain medical conditions may also need to wear the mask at other times when they are awake. Follow instructions from your health care provider about when to use the machine. What are some tips for using the mask?   Because the mask needs to be snug, some people feel trapped or closed-in (claustrophobic) when first using the mask. If you feel this way, you may need to get used to the mask. One way to do this is by holding the mask loosely over your nose or mouth and then gradually applying the mask more snugly. You can also gradually increase the amount of time that you use the mask.  Masks are available in various types and sizes. Some fit over your mouth and nose while others fit over just your nose. If your mask does not fit well, talk with your health care provider about getting a different one.  If you are using a mask that fits over your nose and you tend to breathe through your mouth, a chin strap may be applied to help keep your mouth closed.  The CPAP and BPAP machines have alarms that may sound if the mask comes off or develops a leak.  If you have trouble with the mask, it is very important that you talk with your health care provider about finding a way to make the mask easier to tolerate. Do  not stop using the mask. Stopping the use of the mask could have a negative impact on your health. What are some tips for using the machine?  Place your CPAP or BPAP machine on a secure table or stand near an electrical outlet.  Know where the on/off switch is located on the machine.  Follow instructions from your health care provider about how to set the pressure on your machine and when you should use it.  Do not eat or drink while the CPAP or BPAP machine is on. Food or fluids could get pushed into your lungs by the pressure of the CPAP or BPAP.  Do  not smoke. Tobacco smoke residue can damage the machine.  For home use, CPAP and BPAP machines can be rented or purchased through home health care companies. Many different brands of machines are available. Renting a machine before purchasing may help you find out which particular machine works well for you.  Keep the CPAP or BPAP machine and attachments clean. Ask your health care provider for specific instructions. Get help right away if:  You have redness or open areas around your nose or mouth where the mask fits.  You have trouble using the CPAP or BPAP machine.  You cannot tolerate wearing the CPAP or BPAP mask.  You have pain, discomfort, and bloating in your abdomen. Summary  CPAP and BPAP are methods of helping a person breathe with the use of air pressure.  Both CPAP and BPAP are provided by a small machine with a flexible plastic tube that attaches to a plastic mask.  If you have trouble with the mask, it is very important that you talk with your health care provider about finding a way to make the mask easier to tolerate. This information is not intended to replace advice given to you by your health care provider. Make sure you discuss any questions you have with your health care provider. Document Released: 06/15/2004 Document Revised: 01/07/2019 Document Reviewed: 08/06/2016 Elsevier Patient Education  2020 [employer].

## 2019-08-21 NOTE — Assessment & Plan Note (Addendum)
-   100% compliant with CPAP and reports benefit from use - Pressure 7-14cm h20; AHI 3.2 - No changes - Advised not to drive if experiencing excessive daytime fatigue or somnolence  - Continues to work on weight loss - FU in 1 year or sooner if needed

## 2019-08-21 NOTE — Telephone Encounter (Signed)
DOS: 09/18/2019  SURGICAL PROCEDURES: Endoscopic Plantar Fasciotomy KQAS(60156) and Repair Peroneal Brevis Tendon FBPP(94327)  BCBS Policy Effective : 61/47/0929  -  09/30/9998   Member Liability Summary       In-Network   Max Per Benefit Period Year-to-Date Remaining     CoInsurance 20%      Deductible $1250.00 $0.00     Out-Of-Pocket $4890.00 $0.00  AMBULATORY SURGERY      In Network Copay Coinsurance Not Applicable 57%  per  [place]

## 2019-08-21 NOTE — Progress Notes (Signed)
Reviewed and agree with assessment/plan.   Vineet Sood, MD [facility] 09/26/2016, 12:24 PM Pager:  [phone]  

## 2019-08-21 NOTE — Progress Notes (Signed)
@Patient  ID: Ryan Ritter, male    DOB: May 23, 1966, 53 y.o.   MRN: [id number]  Chief Complaint  Patient presents with  . Follow-up    F/U OSA. DME [employer]. He reports no issues with his cpap.    Referring provider: Hubbard Hartshorn, FNP  HPI: 53 year old male, never smoked. PMH significant for OSA on CPAP, allergic rhinitis, diabetes mellitus. Patient of Dr. Ashby Dawes, last seen on 09/08/18. HST in October 2018 showed mild obstructive sleep apnea, AHI 11.2/hr. Maintained on CPAP auto titrate 7-14cm h20.   08/21/2019 Patient presents today for follow-up OSA. He is doing well, no acute complaints. Tolerating CPAP well. No issues with mask fit or pressure settings. He is using nasal pillow mask. Reports improvement in snoring. Wakes up once at night to use bathroom, occasionally has trouble falling asleep. DME company is [employer].   [employer] download 10/19-11/17/20: Usage 30/30 days; 100% > 4 hours Average use 6 hours 42 mins Pressure 7-14cm h20 (9.1- 95%) Apnea central 2.0; obstructive 0.5; unknown 0.2 AHI 3.2    Allergies  Allergen Reactions  . Benicar [Olmesartan] Other (See Comments)  . Lisinopril Other (See Comments)    Immunization History  Administered Date(s) Administered  . Influenza Split 06/29/2010  . Influenza, Seasonal, Injecte, Preservative Fre 07/10/2011  . Influenza,inj,Quad PF,6+ Mos 07/24/2013  . Influenza,inj,quad, With Preservative 07/27/2019  . Influenza-Unspecified 07/27/2019  . Pneumococcal Conjugate-13 02/15/2014  . Pneumococcal Polysaccharide-23 04/10/2012  . Td 09/09/2018  . Tdap 07/24/2013    Past Medical History:  Diagnosis Date  . Allergy   . Benign essential HTN 01/29/2007  . Diabetes mellitus without complication ([place])   . Hyperlipidemia   . Mass of left thigh 10/09/2018  . Obesity 03/14/2015  . OSA on CPAP 05/29/2017  . Plantar fasciitis 09/30/2015  . Sleep apnea     Tobacco History: Social History   Tobacco Use  Smoking Status Never  Smoker  Smokeless Tobacco Former Systems developer  . Types: Chew   Counseling given: Not Answered   Outpatient Medications Prior to Visit  Medication Sig Dispense Refill  . Boron 3 MG CAPS Take 1 capsule by mouth 3 (three) times daily.    . Chromium 200 MCG CAPS Take 200 mcg by mouth 2 (two) times daily.     . Cinnamon 500 MG TABS Take 1,000 tablets by mouth 2 (two) times daily.    Marland Kitchen Cod Liver Oil 1000 MG CAPS Take 1 capsule by mouth 2 (two) times daily.     . Coenzyme Q10 (CO Q10) 200 MG CAPS Take 200 mg by mouth daily.    Marland Kitchen doxycycline (MONODOX) 50 MG capsule TAKE 1 CAPSULE BY MOUTH EVERY DAY 30 capsule 5  . EPINEPHrine 0.3 mg/0.3 mL IJ SOAJ injection epinephrine 0.3 mg/0.3 mL injection, auto-injector    . fluticasone (FLONASE) 50 MCG/ACT nasal spray Place 2 sprays into both nostrils daily.    Marland Kitchen levocetirizine (XYZAL) 5 MG tablet Take 5 mg by mouth every evening.     Marland Kitchen losartan (COZAAR) 25 MG tablet Take 0.5 tablets (12.5 mg total) by mouth daily. 15 tablet 11  . metFORMIN (GLUCOPHAGE) 1000 MG tablet TAKE ONE-HALF TABLET BY MOUTH TWICE DAILY WITH A MEAL 90 tablet 1  . VASCEPA 1 g CAPS TAKE 2 CAPSULES BY MOUTH TWICE DAILY 120 capsule 5  . Alpha-Lipoic Acid 200 MG CAPS Take 200 mg by mouth 2 (two) times daily.    Jolyne Loa Grape-Goldenseal (BERBERINE COMPLEX PO) Take 500 mg by  mouth 2 (two) times daily.    Marland Kitchen POTASSIUM GLUCONATE PO Take 1 tablet by mouth daily.    Marland Kitchen triamcinolone cream (KENALOG) 0.1 % Apply 1 application topically 2 (two) times daily. If needed; do not use on face or in groin or under the arms (Patient not taking: Reported on 08/21/2019) 30 g 2  . Turmeric 500 MG CAPS Take 500 mg by mouth daily.     No facility-administered medications prior to visit.    Review of Systems  Review of Systems  Constitutional: Negative.   HENT: Negative.   Respiratory: Negative.   Cardiovascular: Negative.    Physical Exam  BP 126/78 (BP Location: Left Arm, Patient Position: Sitting,  Cuff Size: Large)   Pulse 60   Temp (!) 97.2 F (36.2 C) (Temporal)   Ht 6' (1.829 m)   Wt 226 lb 12.8 oz (102.9 kg)   SpO2 97% Comment: RA  BMI 30.76 kg/m  Physical Exam Constitutional:      Appearance: Normal appearance.  HENT:     Mouth/Throat:     Comments: Deferred d/t masking Cardiovascular:     Rate and Rhythm: Normal rate and regular rhythm.  Pulmonary:     Effort: Pulmonary effort is normal.     Breath sounds: Normal breath sounds. No wheezing or rhonchi.  Musculoskeletal: Normal range of motion.  Skin:    General: Skin is warm and dry.  Neurological:     General: No focal deficit present.     Mental Status: He is alert and oriented to person, place, and time. Mental status is at baseline.  Psychiatric:        Mood and Affect: Mood normal.        Behavior: Behavior normal.        Thought Content: Thought content normal.        Judgment: Judgment normal.      Lab Results:  CBC    Component Value Date/Time   WBC 5.0 08/01/2016 0848   RBC 5.23 08/01/2016 0848   HGB 15.6 08/01/2016 0848   HGB 16.0 04/08/2015 0915   HCT 45.7 08/01/2016 0848   HCT 46.1 04/08/2015 0915   PLT 133 (L) 08/01/2016 0848   PLT 137 (L) 04/08/2015 0915   MCV 87.4 08/01/2016 0848   MCV 86 04/08/2015 0915   MCH 29.8 08/01/2016 0848   MCHC 34.1 08/01/2016 0848   RDW 14.2 08/01/2016 0848   RDW 13.7 04/08/2015 0915   LYMPHSABS 1,100 08/01/2016 0848   MONOABS 450 08/01/2016 0848   EOSABS 300 08/01/2016 0848   BASOSABS 0 08/01/2016 0848    BMET    Component Value Date/Time   NA 140 03/10/2019 0822   NA 135 04/26/2016 0822   K 4.4 03/10/2019 0822   CL 104 03/10/2019 0822   CO2 27 03/10/2019 0822   GLUCOSE 181 (H) 03/10/2019 0822   BUN 19 03/10/2019 0822   BUN 16 04/26/2016 0822   CREATININE 0.78 03/10/2019 0822   CALCIUM 9.3 03/10/2019 0822   GFRNONAA 103 03/10/2019 0822   GFRAA 119 03/10/2019 0822    BNP No results found for: BNP  ProBNP No results found for: PROBNP   Imaging: No results found.   Assessment & Plan:   OSA on CPAP - 100% compliant with CPAP and reports benefit from use - Pressure 7-14cm h20; AHI 3.2 - No changes - Advised not to drive if experiencing excessive daytime fatigue or somnolence  - Continues to work on weight loss -  FU in 1 year or sooner if needed   Glenford BayleyElizabeth W Walsh, NP 08/21/2019

## 2019-09-10 ENCOUNTER — Other Ambulatory Visit: Payer: Self-pay

## 2019-09-10 ENCOUNTER — Ambulatory Visit: Payer: BC Managed Care – PPO | Admitting: Family Medicine

## 2019-09-10 ENCOUNTER — Encounter: Payer: Self-pay | Admitting: Family Medicine

## 2019-09-10 VITALS — BP 122/80 | HR 80 | Temp 97.3°F | Resp 18 | Ht 72.0 in | Wt 226.1 lb

## 2019-09-10 DIAGNOSIS — M4302 Spondylolysis, cervical region: Secondary | ICD-10-CM

## 2019-09-10 DIAGNOSIS — E785 Hyperlipidemia, unspecified: Secondary | ICD-10-CM

## 2019-09-10 DIAGNOSIS — E668 Other obesity: Secondary | ICD-10-CM

## 2019-09-10 DIAGNOSIS — K2100 Gastro-esophageal reflux disease with esophagitis, without bleeding: Secondary | ICD-10-CM

## 2019-09-10 DIAGNOSIS — E1165 Type 2 diabetes mellitus with hyperglycemia: Secondary | ICD-10-CM

## 2019-09-10 DIAGNOSIS — G4733 Obstructive sleep apnea (adult) (pediatric): Secondary | ICD-10-CM

## 2019-09-10 DIAGNOSIS — Z8042 Family history of malignant neoplasm of prostate: Secondary | ICD-10-CM

## 2019-09-10 DIAGNOSIS — R7401 Elevation of levels of liver transaminase levels: Secondary | ICD-10-CM

## 2019-09-10 DIAGNOSIS — E6609 Other obesity due to excess calories: Secondary | ICD-10-CM

## 2019-09-10 DIAGNOSIS — Z9989 Dependence on other enabling machines and devices: Secondary | ICD-10-CM

## 2019-09-10 DIAGNOSIS — Z6830 Body mass index (BMI) 30.0-30.9, adult: Secondary | ICD-10-CM

## 2019-09-10 DIAGNOSIS — L708 Other acne: Secondary | ICD-10-CM

## 2019-09-10 MED ORDER — METFORMIN HCL 1000 MG PO TABS: ORAL_TABLET | ORAL | 1 refills | Status: DC

## 2019-09-10 NOTE — Patient Instructions (Addendum)
Zetia - for cholesterol control Ayr Gel - helps to moisturize the nasal passages

## 2019-09-10 NOTE — Progress Notes (Signed)
Name: Ryan Ritter   MRN: [id number]    DOB: [birth year]   Date:09/10/2019       Progress Note  Subjective  Chief Complaint  Chief Complaint  Patient presents with  . Diabetes    follow up  . Hypertension  . Hyperlipidemia    HPI   Obesity: Doing Keto and intermittent fasting over the last 2 years and feels this really helps him to maintain his weight.  He is currently doing a class with a [facility] physician to learn more about Keto for diabetics.  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  Highest weight is 299lbs; today he is 226lbs. Body mass index is 30.66 kg/m.  GERD: No issues since doing Keto.  Has been asymptomatic iwithout medications.  He denies abdominal pain, difficulty swallowing, regurgitation or blood in stool/dark and tarry stool.  OSA: Wearing CPAP nightly and comfortable with it.  Sleeping well, wearing the entire night.  Diabetes mellitus type 2 Checking sugars?  yes How often? A few times a week Range (low to high) over last two weeks:  Fasting BG's are running 180's-200's.  He also checks in the evening 2 hours post-prandial and is seeing about 130-160.  Trying to limit white bread, white rice, white potatoes, sweets?  yes - doing keto Trying to limit sweetened drinks like iced tea, soft drinks, sports drinks, fruit juices?  yes - only drinks water and coffee.  Checking feet every day/night?  yes Last eye exam:  Overdue and is reminded of this - scheduled next week for this.  Denies: Polydipsia, polyphagia, vision changes, or neuropathy. Endorses some polyuria. Most recent A1C:  Lab Results  Component Value Date   HGBA1C 6.2 (H) 03/10/2019  We will recheck today. Last CMP Results : is due for repeat today; pt would also like to have his C-peptide and fasting insulin checked today. Urine Micro UTD? No Current Medication Management: Diabetic Medications: Metformin 500mg  BID. ACEI/ARB: Yes - for  kidney protection only, has not had elevated BP - on 12.5mg  Losartan.  Would like to come off of this if able - will maintain due to recent BG elevations. Statin: No  Aspirin therapy: No  HLD: Has hx elevated TGD's; doing well on Vascepa BID.  About a year ago he changed his diet to be more ketogenic - lost 50lbs and his HDL came up to 43.  His last LDL was 100, advised would like him to be <70; he does not want to take statin therapy again because he thinks it affected his memory.  We will recheck labs today - consider Zetia if needed. The 10-year ASCVD risk score Mikey Bussing DC Brooke Bonito., et al., 2013) is: 9.6%   Values used to calculate the score:     Age: 17 years     Sex: Male     Is Non-Hispanic African American: No     Diabetic: Yes     Tobacco smoker: No     Systolic Blood Pressure: 326 mmHg     Is BP treated: Yes     HDL Cholesterol: 45 mg/dL     Total Cholesterol: 187 mg/dL  Hx elevated AST: Has made drastic dietary changes and has lost about 50lbs on Keto diet over the last 1.5 years.  His last several liver function testing has been normal.   Hypogonadism: Has history of low testosterone - taking Boron supplement and thinks this does help  along with his Keto.  Would like levels checked today.  Energy levels have been adequate. Denies ED; normal libido.  Spondylosis Cervical Spine: Still going to chiropractor and massage therapist monthly. Pain is well controlled.  Adult Acne: Has been taking 50mg  Doxycycline daily; acne has improved since doing Keto.  Discussed trying to stop doxy for a month or two to see how he does. Will keep medication on his list for now, he will let me know if he chooses to stop.  Patient Active Problem List   Diagnosis Date Noted  . Type 2 diabetes mellitus with hyperglycemia, without long-term current use of insulin (HCC) 03/10/2019  . 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 Vitiligo 09/09/2018  . Elevated AST (SGOT) 09/05/2018  . OSA on CPAP 05/29/2017  . Low  HDL (under 40) 11/08/2016  . 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 Cervical lymphadenopathy 07/27/2016  . 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 Family history of colonic polyps 03/14/2015  . Esophagitis, reflux 03/14/2015  . 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 Hypotestosteronism 03/14/2015  . Abnormal presence of protein in urine 03/14/2015  . Diabetes mellitus without complication (HCC) 06/12/2010  . Family history of malignant neoplasm of prostate 05/21/2008  . Acne 11/19/2007  . Hyperlipidemia LDL goal <70 01/29/2007    Past Surgical History:  Procedure Laterality Date  . ANAL FISSURE REPAIR    . tendinitis Left   . TRIGGER FINGER RELEASE    . vascectomy  08/2014    Family History  Problem Relation Age of Onset  . Diabetes Father   . Pancreatic cancer Father   . Diabetes Brother   . Diabetes Maternal Grandmother   . Breast cancer Mother   . Colon polyps Mother     Social History   Socioeconomic History  . Marital status: Married    Spouse name: Not on file  . Number of children: Not on file  . Years of education: Not on file  . Highest education level: Not on file  Occupational History  . Not on file  Tobacco Use  . Smoking status: Never Smoker  . Smokeless tobacco: Former NeurosurgeonUser    Types: Chew  Substance and Sexual Activity  . Alcohol use: No    Alcohol/week: 0.0 standard drinks  . Drug use: No  . Sexual activity: Yes    Partners: Female  Other Topics Concern  . Not on file  Social History Narrative  . Not on file   Social Determinants of Health   Financial Resource Strain: Low Risk   . Difficulty of Paying Living Expenses: Not hard at all  Food Insecurity: No Food Insecurity  . Worried About Programme researcher, broadcasting/film/videounning Out of Food in the Last Year: Never true  . Ran Out of Food in the Last Year: Never true  Transportation Needs: No Transportation Needs  . Lack of Transportation (Medical): No  . Lack of Transportation (Non-Medical): No  Physical Activity:   .  Days of Exercise per Week: Not on file  . Minutes of Exercise per Session: Not on file  Stress: No Stress Concern Present  . Feeling of Stress : Not at all  Social Connections: Unknown  . Frequency of Communication with Friends and Family: Three times a week  . Frequency of Social Gatherings with Friends and Family: Three times a week  . Attends Religious Services: Not on file  . Active Member of Clubs or Organizations: No  . Attends BankerClub or Organization Meetings: Never  . Marital Status: Married  Intimate Partner Violence: Not At Risk  . Fear of Current or Ex-Partner: No  . Emotionally Abused: No  . Physically Abused: No  . Sexually Abused: No     Current Outpatient Medications:  .  Boron 3 MG CAPS, Take 1 capsule by mouth 3 (three) times daily., Disp: , Rfl:  .  Chromium 200 MCG CAPS, Take 200 mcg by mouth 2 (two) times daily. , Disp: , Rfl:  .  Cinnamon 500 MG TABS, Take 1,000 tablets by mouth 2 (two) times daily., Disp: , Rfl:  .  Coenzyme Q10 (CO Q10) 200 MG CAPS, Take 200 mg by mouth daily., Disp: , Rfl:  .  doxycycline (MONODOX) 50 MG capsule, TAKE 1 CAPSULE BY MOUTH EVERY DAY, Disp: 30 capsule, Rfl: 5 .  EPINEPHrine 0.3 mg/0.3 mL IJ SOAJ injection, epinephrine 0.3 mg/0.3 mL injection, auto-injector, Disp: , Rfl:  .  fluticasone (FLONASE) 50 MCG/ACT nasal spray, Place 2 sprays into both nostrils daily., Disp: , Rfl:  .  levocetirizine (XYZAL) 5 MG tablet, Take 5 mg by mouth every evening. , Disp: , Rfl:  .  losartan (COZAAR) 25 MG tablet, Take 0.5 tablets (12.5 mg total) by mouth daily., Disp: 15 tablet, Rfl: 11 .  metFORMIN (GLUCOPHAGE) 1000 MG tablet, TAKE ONE-HALF TABLET BY MOUTH TWICE DAILY WITH A MEAL, Disp: 90 tablet, Rfl: 1 .  VASCEPA 1 g CAPS, TAKE 2 CAPSULES BY MOUTH TWICE DAILY, Disp: 120 capsule, Rfl: 5 .  Cod Liver Oil 1000 MG CAPS, Take 1 capsule by mouth 2 (two) times daily. , Disp: , Rfl:   Allergies  Allergen Reactions  . Benicar [Olmesartan] Other (See  Comments)  . Lisinopril Other (See Comments)    I personally reviewed active problem list, medication list, allergies, notes from last encounter, lab results with the patient/caregiver today.   ROS  Constitutional: Negative for fever or weight change.  Respiratory: Negative for cough and shortness of breath.   Cardiovascular: Negative for chest pain or palpitations.  Gastrointestinal: Negative for abdominal pain, no bowel changes.  Musculoskeletal: Negative for gait problem or joint swelling.  Skin: Negative for rash.  Neurological: Negative for dizziness or headache.  No other specific complaints in a complete review of systems (except as listed in HPI above).  Objective  Vitals:   09/10/19 0726  BP: 122/80  Pulse: 80  Resp: 18  Temp: (!) 97.3 F (36.3 C)  TempSrc: Temporal  SpO2: 99%  Weight: 226 lb 1.6 oz (102.6 kg)  Height: 6' (1.829 m)    Body mass index is 30.66 kg/m.  Physical Exam  Constitutional: Patient appears well-developed and well-nourished. No distress.  HENT: Head: Normocephalic and atraumatic.  Eyes: Conjunctivae and EOM are normal. No scleral icterus.  Neck: Normal range of motion. Neck supple. No JVD present.  Cardiovascular: Normal rate, regular rhythm and normal heart sounds.  No murmur heard. No BLE edema. Pulmonary/Chest: Effort normal and breath sounds normal. No respiratory distress. Musculoskeletal: Normal range of motion, no joint effusions. No gross deformities Neurological: Pt is alert and oriented to person, place, and time. No cranial nerve deficit. Coordination, balance, strength, speech and gait are normal.  Skin: Skin is warm and dry. No rash noted. No erythema.  Psychiatric: Patient has a normal mood and affect. behavior is normal. Judgment and thought content normal.  No results found for this or any previous visit (from the past 72 hour(s)).  PHQ2/9: Depression screen [facility] 2/9 09/10/2019 03/10/2019 09/09/2018 09/02/2018 01/03/2018   Decreased  Interest 0 0 0 0 0  Down, Depressed, Hopeless 0 0 0 0 0  PHQ - 2 Score 0 0 0 0 0  Altered sleeping 0 0 0 0 -  Tired, decreased energy 1 0 0 0 -  Change in appetite 0 0 0 0 -  Feeling bad or failure about yourself  0 0 0 0 -  Trouble concentrating 0 0 0 0 -  Moving slowly or fidgety/restless 0 0 0 0 -  Suicidal thoughts 0 0 0 0 -  PHQ-9 Score 1 0 0 0 -  Difficult doing work/chores Not difficult at all Not difficult at all Not difficult at all Not difficult at all -   PHQ-2/9 Result is negative.    Fall Risk: Fall Risk  09/10/2019 03/10/2019 10/09/2018 09/16/2018 09/09/2018  Falls in the past year? 0 0 0 0 0  Number falls in past yr: 0 0 - - -  Injury with Fall? 0 0 - - -  Follow up Falls evaluation completed Falls evaluation completed - - -   Assessment & Plan  1. Class 1 obesity due to excess calories with serious comorbidity and body mass index (BMI) of 30.0 to 30.9 in adult - Discussed importance of 150 minutes of physical activity weekly, eat two servings of fish weekly, eat one serving of tree nuts ( cashews, pistachios, pecans, almonds.Marland Kitchen) every other day, eat 6 servings of fruit/vegetables daily and drink plenty of water and avoid sweet beverages.  - COMPLETE METABOLIC PANEL WITH GFR  2. OSA on CPAP - Compliant with CPAP  3. Gastroesophageal reflux disease with esophagitis without hemorrhage - Diet controlled  4. Type 2 diabetes mellitus with hyperglycemia, without long-term current use of insulin (HCC) - COMPLETE METABOLIC PANEL WITH GFR - Hemoglobin A1c - Microalbumin / creatinine urine ratio - metFORMIN (GLUCOPHAGE) 1000 MG tablet; TAKE ONE TABLET BY MOUTH TWICE DAILY WITH A MEAL  Dispense: 180 tablet; Refill: 1 - Insulin and C-Peptide  5. Hyperlipidemia LDL goal <70 - Lipid panel - Vascepa - continue. - May add Zetia - he will look this up today  6. Elevated AST (SGOT) - COMPLETE METABOLIC PANEL WITH GFR  7. Family history of prostate cancer - PSA   8. Hypogonadal obesity - Testosterone Total,Free,Bio, Males  9. Spondylolysis of cervical region - Seeing chiropractor  10. Other acne - Continue Doxycycline daily.

## 2019-09-11 ENCOUNTER — Other Ambulatory Visit: Payer: Self-pay | Admitting: Family Medicine

## 2019-09-11 DIAGNOSIS — E785 Hyperlipidemia, unspecified: Secondary | ICD-10-CM

## 2019-09-11 LAB — TESTOSTERONE TOTAL,FREE,BIO, MALES
Albumin: 4.6 g/dL (ref 3.6–5.1)
Sex Hormone Binding: 27 nmol/L (ref 10–50)
Testosterone, Bioavailable: 132.5 ng/dL (ref >=110.0)
Testosterone, Free: 63.1 pg/mL (ref 46.0–224.0)
Testosterone: 412 ng/dL (ref 250–827)

## 2019-09-11 LAB — COMPLETE METABOLIC PANEL WITH GFR
AG Ratio: 2 (calc) (ref 1.0–2.5)
ALT: 10 U/L (ref 9–46)
AST: 10 U/L (ref 10–35)
Albumin: 4.6 g/dL (ref 3.6–5.1)
Alkaline phosphatase (APISO): 38 U/L (ref 35–144)
BUN: 18 mg/dL (ref 7–25)
CO2: 24 mmol/L (ref 20–32)
Calcium: 9.5 mg/dL (ref 8.6–10.3)
Chloride: 106 mmol/L (ref 98–110)
Creat: 0.93 mg/dL (ref 0.70–1.33)
GFR, Est African American: 108 mL/min/{1.73_m2} (ref >=60)
GFR, Est Non African American: 93 mL/min/{1.73_m2} (ref >=60)
Globulin: 2.3 g/dL (calc) (ref 1.9–3.7)
Glucose, Bld: 189 mg/dL — ABNORMAL HIGH (ref 65–99)
Potassium: 4.6 mmol/L (ref 3.5–5.3)
Sodium: 139 mmol/L (ref 135–146)
Total Bilirubin: 0.8 mg/dL (ref 0.2–1.2)
Total Protein: 6.9 g/dL (ref 6.1–8.1)

## 2019-09-11 LAB — INSULIN, RANDOM: Insulin: 5.4 u[IU]/mL

## 2019-09-11 LAB — MICROALBUMIN / CREATININE URINE RATIO
Creatinine, Urine: 102 mg/dL (ref 20–320)
Microalb Creat Ratio: 4 mcg/mg creat (ref <30)
Microalb, Ur: 0.4 mg/dL

## 2019-09-11 LAB — HEMOGLOBIN A1C
Hgb A1c MFr Bld: 6.7 % of total Hgb — ABNORMAL HIGH (ref <5.7)
Mean Plasma Glucose: 146 (calc)
eAG (mmol/L): 8.1 (calc)

## 2019-09-11 LAB — LIPID PANEL
Cholesterol: 210 mg/dL — ABNORMAL HIGH (ref <200)
HDL: 37 mg/dL — ABNORMAL LOW (ref >=40)
LDL Cholesterol (Calc): 150 mg/dL (calc) — ABNORMAL HIGH
Non-HDL Cholesterol (Calc): 173 mg/dL (calc) — ABNORMAL HIGH (ref <130)
Total CHOL/HDL Ratio: 5.7 (calc) — ABNORMAL HIGH (ref <5.0)
Triglycerides: 111 mg/dL (ref <150)

## 2019-09-11 LAB — C-PEPTIDE: C-Peptide: 1.38 ng/mL (ref 0.80–3.85)

## 2019-09-11 LAB — PSA: PSA: 0.5 ng/mL (ref <=4.0)

## 2019-09-11 MED ORDER — EZETIMIBE 10 MG PO TABS: 10.0000 mg | ORAL_TABLET | Freq: Every day | ORAL | 3 refills | Status: DC

## 2019-09-14 ENCOUNTER — Encounter: Payer: BC Managed Care – PPO | Admitting: Family Medicine

## 2019-09-16 LAB — HM DIABETES EYE EXAM

## 2019-09-17 ENCOUNTER — Encounter: Payer: Self-pay | Admitting: Family Medicine

## 2019-09-17 ENCOUNTER — Other Ambulatory Visit: Payer: Self-pay

## 2019-09-17 ENCOUNTER — Ambulatory Visit (INDEPENDENT_AMBULATORY_CARE_PROVIDER_SITE_OTHER): Payer: BC Managed Care – PPO | Admitting: Family Medicine

## 2019-09-17 ENCOUNTER — Other Ambulatory Visit: Payer: Self-pay | Admitting: Podiatry

## 2019-09-17 VITALS — BP 128/68 | HR 92 | Temp 97.5°F | Resp 16 | Ht 72.0 in | Wt 226.7 lb

## 2019-09-17 DIAGNOSIS — Z0000 Encounter for general adult medical examination without abnormal findings: Secondary | ICD-10-CM | POA: Diagnosis not present

## 2019-09-17 MED ORDER — ONDANSETRON HCL 4 MG PO TABS: 4.0000 mg | ORAL_TABLET | Freq: Three times a day (TID) | ORAL | 0 refills | Status: DC | PRN

## 2019-09-17 MED ORDER — CEPHALEXIN 500 MG PO CAPS: 500.0000 mg | ORAL_CAPSULE | Freq: Three times a day (TID) | ORAL | 0 refills | Status: DC

## 2019-09-17 MED ORDER — OXYCODONE-ACETAMINOPHEN 10-325 MG PO TABS: 1.0000 | ORAL_TABLET | ORAL | 0 refills | Status: DC | PRN

## 2019-09-17 NOTE — Progress Notes (Signed)
Name: Ryan Ritter   MRN: [id number]    DOB: [birth year]   Date:09/17/2019       Progress Note  Subjective  Chief Complaint  Chief Complaint  Patient presents with  . Annual Exam    HPI  Patient presents for annual CPE.  USPSTF grade A and B recommendations:  Diet: Keto - doing well on this Exercise: Exercising regularly.  Depression: phq 9 is negative Depression screen [facility] 2/9 09/17/2019 09/10/2019 03/10/2019 09/09/2018 09/02/2018  Decreased Interest 0 0 0 0 0  Down, Depressed, Hopeless 0 0 0 0 0  PHQ - 2 Score 0 0 0 0 0  Altered sleeping 0 0 0 0 0  Tired, decreased energy 0 1 0 0 0  Change in appetite 0 0 0 0 0  Feeling bad or failure about yourself  0 0 0 0 0  Trouble concentrating 0 0 0 0 0  Moving slowly or fidgety/restless 0 0 0 0 0  Suicidal thoughts 0 0 0 0 0  PHQ-9 Score 0 1 0 0 0  Difficult doing work/chores Not difficult at all Not difficult at all Not difficult at all Not difficult at all Not difficult at all    Hypertension:  BP Readings from Last 3 Encounters:  09/17/19 128/68  09/10/19 122/80  08/21/19 126/78    Obesity: Wt Readings from Last 3 Encounters:  09/17/19 226 lb 11.2 oz (102.8 kg)  09/10/19 226 lb 1.6 oz (102.6 kg)  08/21/19 226 lb 12.8 oz (102.9 kg)   BMI Readings from Last 3 Encounters:  09/17/19 30.75 kg/m  09/10/19 30.66 kg/m  08/21/19 30.76 kg/m     Lipids:  Lab Results  Component Value Date   CHOL 210 (H) 09/10/2019   CHOL 187 03/10/2019   CHOL 161 09/02/2018   Lab Results  Component Value Date   HDL 37 (L) 09/10/2019   HDL 45 03/10/2019   HDL 43 09/02/2018   Lab Results  Component Value Date   LDLCALC 150 (H) 09/10/2019   LDLCALC 123 (H) 03/10/2019   LDLCALC 100 (H) 09/02/2018   Lab Results  Component Value Date   TRIG 111 09/10/2019   TRIG 87 03/10/2019   TRIG 88 09/02/2018   Lab Results  Component Value Date   CHOLHDL 5.7 (H) 09/10/2019   CHOLHDL 4.2 03/10/2019   CHOLHDL 3.7 09/02/2018   No results  found for: LDLDIRECT Glucose:  Glucose, Bld  Date Value Ref Range Status  09/10/2019 189 (H) 65 - 99 mg/dL Final    Comment:    .            Fasting reference interval . For someone without known diabetes, a glucose value >125 mg/dL indicates that they may have diabetes and this should be confirmed with a follow-up test. .   03/10/2019 181 (H) 65 - 99 mg/dL Final    Comment:    .            Fasting reference interval . For someone without known diabetes, a glucose value >125 mg/dL indicates that they may have diabetes and this should be confirmed with a follow-up test. .   09/02/2018 166 (H) 65 - 99 mg/dL Final    Comment:    .            Fasting reference interval . For someone without known diabetes, a glucose value >125 mg/dL indicates that they may have diabetes and this should be confirmed with a follow-up test. .  Office Visit from 09/17/2019 in [facility]  AUDIT-C Score  0    No ETOH use.  Married STD testing and prevention (HIV/chl/gon/syphilis): No concerns; declines screening today. Negative in 2019  Hep C: We will check at next lab draw  Skin cancer: Discussed monitoring for atypical lesions.  No concerning lesions Colorectal cancer: Denies family or personal history of colorectal cancer, no changes in BM's - no blood in stool, dark and tarry stool, mucus in stool, or constipation/diarrhea.  Mother and brother with history of polyps.  Reports having had colonoscopy this year, however we do not have record of this on file.  Dr. Shelle Iron with [facility]  06/05/2019 - repeat in 3 years.  Prostate cancer: MGF with history prostate cancer.  Lab Results  Component Value Date   PSA 0.5 09/10/2019   PSA 0.4 02/25/2018   IPSS Questionnaire (AUA-7): Over the past month.   1)  How often have you had a sensation of not emptying your bladder completely after you finish urinating?  0 - Not at all  2)  How often have you had to urinate again less  than two hours after you finished urinating? 3 - About half the time  3)  How often have you found you stopped and started again several times when you urinated?  0 - Not at all  4) How difficult have you found it to postpone urination?  0 - Not at all  5) How often have you had a weak urinary stream?  1 - Less than 1in5 times  6) How often have you had to push or strain to begin urination?  0 - Not at all  7) How many times did you most typically get up to urinate from the time you went to bed until the time you got up in the morning?  1 - 1 time  Total score:  0-7 mildly symptomatic   8-19 moderately symptomatic   20-35 severely symptomatic  Score of 4 - attributes most of this to staying well hydrated and to his DM. PSA has been normal.  Lung cancer: Never smoker. Low Dose CT Chest recommended if Age 34-80 years, 30 pack-year currently smoking OR have quit w/in 15years. Patient does not qualify.   AAA:  The USPSTF recommends one-time screening with ultrasonography in men ages 37 to 19 years who have ever smoked ECG:  Denies chest pain, shortness of breath, palpitations.   Advanced Care Planning: A voluntary discussion about advance care planning including the explanation and discussion of advance directives.  Discussed health care proxy and Living will, and the patient was able to identify a health care proxy as Braidan Ricciardi.  Patient does not have a living will at present time. If patient does have living will, I have requested they bring this to the clinic to be scanned in to their chart.  Patient Active Problem List   Diagnosis Date Noted  . Type 2 diabetes mellitus with hyperglycemia, without long-term current use of insulin (HCC) 03/10/2019  . 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 Vitiligo 09/09/2018  . Elevated AST (SGOT) 09/05/2018  . OSA on CPAP 05/29/2017  . Low HDL (under 40) 11/08/2016  . 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 Cervical lymphadenopathy 07/27/2016  . Chronic  pharyngitis 05/29/2016  . Family history of colonic polyps 03/14/2015  . Gastroesophageal reflux disease with esophagitis without hemorrhage 03/14/2015  . 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 Hypotestosteronism 03/14/2015  . Abnormal presence of protein in  urine 03/14/2015  . Diabetes mellitus without complication (HCC) 06/12/2010  . Family history of malignant neoplasm of prostate 05/21/2008  . Acne 11/19/2007  . Hyperlipidemia LDL goal <70 01/29/2007    Past Surgical History:  Procedure Laterality Date  . ANAL FISSURE REPAIR    . tendinitis Left   . TRIGGER FINGER RELEASE    . vascectomy  08/2014    Family History  Problem Relation Age of Onset  . Diabetes Father   . Pancreatic cancer Father   . Diabetes Brother   . Diabetes Maternal Grandmother   . Breast cancer Mother   . Colon polyps Mother     Social History   Socioeconomic History  . Marital status: Married    Spouse name: Not on file  . Number of children: Not on file  . Years of education: Not on file  . Highest education level: Not on file  Occupational History  . Not on file  Tobacco Use  . Smoking status: Never Smoker  . Smokeless tobacco: Former Neurosurgeon    Types: Chew  Substance and Sexual Activity  . Alcohol use: No    Alcohol/week: 0.0 standard drinks  . Drug use: No  . Sexual activity: Yes    Partners: Female  Other Topics Concern  . Not on file  Social History Narrative  . Not on file   Social Determinants of Health   Financial Resource Strain:   . Difficulty of Paying Living Expenses: Not on file  Food Insecurity:   . Worried About Programme researcher, broadcasting/film/video in the Last Year: Not on file  . Ran Out of Food in the Last Year: Not on file  Transportation Needs:   . Lack of Transportation (Medical): Not on file  . Lack of Transportation (Non-Medical): Not on file  Physical Activity:   . Days of Exercise per Week: Not on file  . Minutes of Exercise per Session: Not on file  Stress:   . Feeling of  Stress : Not on file  Social Connections:   . Frequency of Communication with Friends and Family: Not on file  . Frequency of Social Gatherings with Friends and Family: Not on file  . Attends Religious Services: Not on file  . Active Member of Clubs or Organizations: Not on file  . Attends Banker Meetings: Not on file  . Marital Status: Not on file  Intimate Partner Violence:   . Fear of Current or Ex-Partner: Not on file  . Emotionally Abused: Not on file  . Physically Abused: Not on file  . Sexually Abused: Not on file     Current Outpatient Medications:  .  Boron 3 MG CAPS, Take 1 capsule by mouth 3 (three) times daily., Disp: , Rfl:  .  cephALEXin (KEFLEX) 500 MG capsule, Take 1 capsule (500 mg total) by mouth 3 (three) times daily., Disp: 30 capsule, Rfl: 0 .  Chromium 200 MCG CAPS, Take 200 mcg by mouth 2 (two) times daily. , Disp: , Rfl:  .  Cinnamon 500 MG TABS, Take 1,000 tablets by mouth 2 (two) times daily., Disp: , Rfl:  .  Coenzyme Q10 (CO Q10) 200 MG CAPS, Take 200 mg by mouth daily., Disp: , Rfl:  .  doxycycline (MONODOX) 50 MG capsule, TAKE 1 CAPSULE BY MOUTH EVERY DAY, Disp: 30 capsule, Rfl: 5 .  EPINEPHrine 0.3 mg/0.3 mL IJ SOAJ injection, epinephrine 0.3 mg/0.3 mL injection, auto-injector, Disp: , Rfl:  .  ezetimibe (ZETIA) 10  MG tablet, Take 1 tablet (10 mg total) by mouth daily., Disp: 90 tablet, Rfl: 3 .  fluticasone (FLONASE) 50 MCG/ACT nasal spray, Place 2 sprays into both nostrils daily., Disp: , Rfl:  .  levocetirizine (XYZAL) 5 MG tablet, Take 5 mg by mouth every evening. , Disp: , Rfl:  .  losartan (COZAAR) 25 MG tablet, Take 0.5 tablets (12.5 mg total) by mouth daily., Disp: 15 tablet, Rfl: 11 .  metFORMIN (GLUCOPHAGE) 1000 MG tablet, TAKE ONE TABLET BY MOUTH TWICE DAILY WITH A MEAL, Disp: 180 tablet, Rfl: 1 .  ondansetron (ZOFRAN) 4 MG tablet, Take 1 tablet (4 mg total) by mouth every 8 (eight) hours as needed., Disp: 20 tablet, Rfl: 0 .   oxyCODONE-acetaminophen (PERCOCET) 10-325 MG tablet, Take 1 tablet by mouth every 4 (four) hours as needed for pain., Disp: 30 tablet, Rfl: 0 .  VASCEPA 1 g CAPS, TAKE 2 CAPSULES BY MOUTH TWICE DAILY, Disp: 120 capsule, Rfl: 5  Allergies  Allergen Reactions  . Benicar [Olmesartan] Other (See Comments)  . Lisinopril Other (See Comments)    ROS  Constitutional: Negative for fever or weight change.  Respiratory: Negative for cough and shortness of breath.   Cardiovascular: Negative for chest pain or palpitations.  Gastrointestinal: Negative for abdominal pain, no bowel changes.  Musculoskeletal: Negative for gait problem or joint swelling.  Skin: Negative for rash.  Neurological: Negative for dizziness or headache.  No other specific complaints in a complete review of systems (except as listed in HPI above).  Objective  Vitals:   09/17/19 1338  BP: 128/68  Pulse: 92  Resp: 16  Temp: (!) 97.5 F (36.4 C)  TempSrc: Temporal  SpO2: 96%  Weight: 226 lb 11.2 oz (102.8 kg)  Height: 6' (1.829 m)    Body mass index is 30.75 kg/m.  Physical Exam  Constitutional: Patient appears well-developed and well-nourished. No distress.  HENT: Head: Normocephalic and atraumatic. Ears: B TMs ok, no erythema or effusion; Nose: Nose normal. Mouth/Throat: Oropharynx is clear and moist. No oropharyngeal exudate.  Eyes: Conjunctivae and EOM are normal. Pupils are equal, round, and reactive to light. No scleral icterus.  Neck: Normal range of motion. Neck supple. No JVD present. No thyromegaly present.  Cardiovascular: Normal rate, regular rhythm and normal heart sounds.  No murmur heard. No BLE edema. Pulmonary/Chest: Effort normal and breath sounds normal. No respiratory distress. Abdominal: Soft. Bowel sounds are normal, no distension. There is no tenderness. no masses MALE GENITALIA: Deferred RECTAL: Deferred Musculoskeletal: Normal range of motion, no joint effusions. No gross  deformities Neurological: he is alert and oriented to person, place, and time. No cranial nerve deficit. Coordination, balance, strength, speech and gait are normal.  Skin: Skin is warm and dry. No rash noted. No erythema.  Psychiatric: Patient has a normal mood and affect. behavior is normal. Judgment and thought content normal.  Recent Results (from the past 2160 hour(s))  COMPLETE METABOLIC PANEL WITH GFR     Status: Abnormal   Collection Time: 09/10/19 12:00 AM  Result Value Ref Range   Glucose, Bld 189 (H) 65 - 99 mg/dL    Comment: .            Fasting reference interval . For someone without known diabetes, a glucose value >125 mg/dL indicates that they may have diabetes and this should be confirmed with a follow-up test. .    BUN 18 7 - 25 mg/dL   Creat 5.10 2.58 - 5.27 mg/dL  Comment: For patients >43 years of age, the reference limit for Creatinine is approximately 13% higher for people identified as African-American. .    GFR, Est Non African American 93 > OR = 60 mL/min/1.22m2   GFR, Est African American 108 > OR = 60 mL/min/1.27m2   BUN/Creatinine Ratio NOT APPLICABLE 6 - 22 (calc)   Sodium 139 135 - 146 mmol/L   Potassium 4.6 3.5 - 5.3 mmol/L   Chloride 106 98 - 110 mmol/L   CO2 24 20 - 32 mmol/L   Calcium 9.5 8.6 - 10.3 mg/dL   Total Protein 6.9 6.1 - 8.1 g/dL   Albumin 4.6 3.6 - 5.1 g/dL   Globulin 2.3 1.9 - 3.7 g/dL (calc)   AG Ratio 2.0 1.0 - 2.5 (calc)   Total Bilirubin 0.8 0.2 - 1.2 mg/dL   Alkaline phosphatase (APISO) 38 35 - 144 U/L   AST 10 10 - 35 U/L   ALT 10 9 - 46 U/L  Hemoglobin A1c     Status: Abnormal   Collection Time: 09/10/19 12:00 AM  Result Value Ref Range   Hgb A1c MFr Bld 6.7 (H) <5.7 % of total Hgb    Comment: For someone without known diabetes, a hemoglobin A1c value of 6.5% or greater indicates that they may have  diabetes and this should be confirmed with a follow-up  test. . For someone with known diabetes, a value <7%  indicates  that their diabetes is well controlled and a value  greater than or equal to 7% indicates suboptimal  control. A1c targets should be individualized based on  duration of diabetes, age, comorbid conditions, and  other considerations. . Currently, no consensus exists regarding use of hemoglobin A1c for diagnosis of diabetes for children. .    Mean Plasma Glucose 146 (calc)   eAG (mmol/L) 8.1 (calc)  Lipid panel     Status: Abnormal   Collection Time: 09/10/19 12:00 AM  Result Value Ref Range   Cholesterol 210 (H) <200 mg/dL   HDL 37 (L) > OR = 40 mg/dL   Triglycerides 111 <150 mg/dL   LDL Cholesterol (Calc) 150 (H) mg/dL (calc)    Comment: Reference range: <100 . Desirable range <100 mg/dL for primary prevention;   <70 mg/dL for patients with CHD or diabetic patients  with > or = 2 CHD risk factors. Marland Kitchen LDL-C is now calculated using the Martin-Hopkins  calculation, which is a validated novel method providing  better accuracy than the Friedewald equation in the  estimation of LDL-C.  Cresenciano Genre et al. Annamaria Helling. 6213;086(57): 2061-2068  (http://education.[facility].com/faq/FAQ164)    Total CHOL/HDL Ratio 5.7 (H) <5.0 (calc)   Non-HDL Cholesterol (Calc) 173 (H) <130 mg/dL (calc)    Comment: For patients with diabetes plus 1 major ASCVD risk  factor, treating to a non-HDL-C goal of <100 mg/dL  (LDL-C of <70 mg/dL) is considered a therapeutic  option.   Microalbumin / creatinine urine ratio     Status: None   Collection Time: 09/10/19 12:00 AM  Result Value Ref Range   Creatinine, Urine 102 20 - 320 mg/dL   Microalb, Ur 0.4 mg/dL    Comment: Reference Range Not established    Microalb Creat Ratio 4 <30 mcg/mg creat    Comment: . The ADA defines abnormalities in albumin excretion as follows: Marland Kitchen Category         Result (mcg/mg creatinine) . Normal                    <  30 Microalbuminuria         30-299  Clinical albuminuria   > OR = 300 . The ADA recommends  that at least two of three specimens collected within a 3-6 month period be abnormal before considering a patient to be within a diagnostic category.   Testosterone Total,Free,Bio, Males     Status: None   Collection Time: 09/10/19 12:00 AM  Result Value Ref Range   Testosterone 412 250 - 827 ng/dL   Albumin 4.6 3.6 - 5.1 g/dL   Sex Hormone Binding 27 10 - 50 nmol/L   Testosterone, Free 63.1 46.0 - 224.0 pg/mL   Testosterone, Bioavailable 132.5 110.0 - 575 ng/dL  PSA     Status: None   Collection Time: 09/10/19 12:00 AM  Result Value Ref Range   PSA 0.5 < OR = 4.0 ng/mL    Comment: The total PSA value from this assay system is  standardized against the WHO standard. The test  result will be approximately 20% lower when compared  to the equimolar-standardized total PSA (Beckman  Coulter). Comparison of serial PSA results should be  interpreted with this fact in mind. . This test was performed using the Siemens  chemiluminescent method. Values obtained from  different assay methods cannot be used interchangeably. PSA levels, regardless of value, should not be interpreted as absolute evidence of the presence or absence of disease.   C-peptide     Status: None   Collection Time: 09/10/19 12:00 AM  Result Value Ref Range   C-Peptide 1.38 0.80 - 3.85 ng/mL  Insulin, random     Status: None   Collection Time: 09/10/19 12:00 AM  Result Value Ref Range   Insulin 5.4 uIU/mL    Comment:      Reference Range  < or = 19.6 .      Risk:      Optimal          < or = 19.6      Moderate         NA      High             >19.6 .      Adult cardiovascular event risk category      cut points (optimal, moderate, high)      are based on [employer] population      data from 08/2010. . This insulin assay shows strong cross-reactivity for some insulin analogs (lispro, aspart, and glargine) and much lower cross-reactivity with others (detemir, glulisine).       PHQ2/9: Depression screen [facility] 2/9 09/17/2019 09/10/2019 03/10/2019 09/09/2018 09/02/2018  Decreased Interest 0 0 0 0 0  Down, Depressed, Hopeless 0 0 0 0 0  PHQ - 2 Score 0 0 0 0 0  Altered sleeping 0 0 0 0 0  Tired, decreased energy 0 1 0 0 0  Change in appetite 0 0 0 0 0  Feeling bad or failure about yourself  0 0 0 0 0  Trouble concentrating 0 0 0 0 0  Moving slowly or fidgety/restless 0 0 0 0 0  Suicidal thoughts 0 0 0 0 0  PHQ-9 Score 0 1 0 0 0  Difficult doing work/chores Not difficult at all Not difficult at all Not difficult at all Not difficult at all Not difficult at all    Fall Risk: Fall Risk  09/17/2019 09/10/2019 03/10/2019 10/09/2018 09/16/2018  Falls in the past year? 0 0 0 0 0  Number  falls in past yr: 0 0 0 - -  Injury with Fall? 0 0 0 - -  Follow up Falls evaluation completed Falls evaluation completed Falls evaluation completed - -    Assessment & Plan  1. Annual physical exam -Prostate cancer screening and PSA options (with potential risks and benefits of testing vs not testing) were discussed along with recent recs/guidelines. -USPSTF grade A and B recommendations reviewed with patient; age-appropriate recommendations, preventive care, screening tests, etc discussed and encouraged; healthy living encouraged; see AVS for patient education given to patient -Discussed importance of 150 minutes of physical activity weekly, eat two servings of fish weekly, eat one serving of tree nuts ( cashews, pistachios, pecans, almonds.Marland Kitchen) every other day, eat 6 servings of fruit/vegetables daily and drink plenty of water and avoid sweet beverages.

## 2019-09-18 DIAGNOSIS — T148XXA Other injury of unspecified body region, initial encounter: Secondary | ICD-10-CM

## 2019-09-18 DIAGNOSIS — M722 Plantar fascial fibromatosis: Secondary | ICD-10-CM

## 2019-09-22 ENCOUNTER — Encounter: Payer: Self-pay | Admitting: Emergency Medicine

## 2019-09-23 ENCOUNTER — Other Ambulatory Visit: Payer: Self-pay

## 2019-09-23 ENCOUNTER — Encounter: Payer: Self-pay | Admitting: Podiatry

## 2019-09-23 ENCOUNTER — Ambulatory Visit (INDEPENDENT_AMBULATORY_CARE_PROVIDER_SITE_OTHER): Payer: Self-pay | Admitting: Podiatry

## 2019-09-23 DIAGNOSIS — M779 Enthesopathy, unspecified: Secondary | ICD-10-CM

## 2019-09-23 DIAGNOSIS — Z9889 Other specified postprocedural states: Secondary | ICD-10-CM

## 2019-09-23 DIAGNOSIS — M722 Plantar fascial fibromatosis: Secondary | ICD-10-CM

## 2019-09-23 NOTE — Progress Notes (Signed)
He presents today for his first postop visit date of surgery is 09/28/2019 status post endoscopic plantar fasciotomy left and repair of peroneus brevis tendon left.  States that is really been hurting and sore he states that has been on his foot to some degree.  Denies fever chills nausea vomiting muscle aches pain.  Objective: Vital signs are stable he is alert and oriented x3 presents in his cam walker today was removed demonstrates dry sterile dressing intact with mild bleeding along the peroneal sites.  Sutures are intact margins are well coapted moderate edema mild erythema no cellulitis drainage or odor.  Mildly tender on palpation at the surgical sites.  Assessment: First postop visit 09/18/2019.  Plan: Redressed today dressed a compressive dressing encouraged him to stay off of it is much as he can allow this to heal.  He understands and is amenable to it.

## 2019-09-30 ENCOUNTER — Encounter: Payer: Self-pay | Admitting: Podiatry

## 2019-09-30 ENCOUNTER — Ambulatory Visit (INDEPENDENT_AMBULATORY_CARE_PROVIDER_SITE_OTHER): Payer: BC Managed Care – PPO | Admitting: Podiatry

## 2019-09-30 ENCOUNTER — Other Ambulatory Visit: Payer: Self-pay

## 2019-09-30 DIAGNOSIS — M722 Plantar fascial fibromatosis: Secondary | ICD-10-CM | POA: Diagnosis not present

## 2019-09-30 DIAGNOSIS — Z9889 Other specified postprocedural states: Secondary | ICD-10-CM | POA: Diagnosis not present

## 2019-09-30 DIAGNOSIS — M779 Enthesopathy, unspecified: Secondary | ICD-10-CM | POA: Diagnosis not present

## 2019-09-30 NOTE — Progress Notes (Signed)
He presents today postop visit #2 date of surgery 09/18/2019 status post EPF and repair of peroneus brevis tendon.  He states that he is doing well and states that the pain has suddenly come to an end.  He denies fever chills nausea vomiting muscle aches and pains.  Objective: Vital signs are stable he is alert and oriented x3 staples are intact laterally there is minimal edema no erythema cellulitis drainage or odor sutures are intact to the endoscopic sites.  All the staples and stitches were removed today margins remain well coapted.  We placed him in a compression anklet and number to allow him to get back into his boot or his cam walker.  I did note that the cam walker is causing some second bruising.  Assessment: Well-healing surgical foot left.  Plan: I did talk to him about trying to get into his regular boot if his foot is not too swollen and if is not too painful I really do not want him in a shoe as of yet.  May need to get him into a night splint as well next visit.  I expressed to him that it is okay if he continues to wear his cam walker however I would definitely cut out some tissue beneath the second metatarsal.  And I will see him in a couple of weeks.  I will allow him to start getting this wet particularly because his wounds are closed.

## 2019-10-01 ENCOUNTER — Other Ambulatory Visit: Payer: Self-pay | Admitting: Family Medicine

## 2019-10-07 ENCOUNTER — Ambulatory Visit: Payer: BC Managed Care – PPO | Admitting: Podiatry

## 2019-10-12 ENCOUNTER — Other Ambulatory Visit: Payer: BC Managed Care – PPO

## 2019-10-14 ENCOUNTER — Encounter: Payer: BC Managed Care – PPO | Admitting: Podiatry

## 2019-10-19 ENCOUNTER — Encounter: Payer: Self-pay | Admitting: Podiatry

## 2019-10-19 ENCOUNTER — Ambulatory Visit (INDEPENDENT_AMBULATORY_CARE_PROVIDER_SITE_OTHER): Payer: BC Managed Care – PPO | Admitting: Podiatry

## 2019-10-19 ENCOUNTER — Other Ambulatory Visit: Payer: Self-pay

## 2019-10-19 DIAGNOSIS — Z9889 Other specified postprocedural states: Secondary | ICD-10-CM

## 2019-10-19 DIAGNOSIS — M722 Plantar fascial fibromatosis: Secondary | ICD-10-CM

## 2019-10-19 DIAGNOSIS — S96912D Strain of unspecified muscle and tendon at ankle and foot level, left foot, subsequent encounter: Secondary | ICD-10-CM

## 2019-10-19 NOTE — Progress Notes (Signed)
He presents today 1 month status post EPF and peroneal tendon repair of the left foot.  States that after he stands for a while it starts to bother him a bit but other than that he is doing quite well with it.  He denies fever chills nausea vomiting muscle aches and pains.  States that the anklet that we provided him was way too tight.  States that he still has some numbness in the plantar forefoot and the dorsal forefoot.  Has ordered compression garments from [employer].  States that he seems to be doing fine with his boot.  Objective: Vital signs are stable alert and oriented x3.  Pulses are palpable.  He has no pain on palpation medial calcaneal tubercle of the left heel he does have some tenderness still along the peroneal tendon incision site but the fluctuance has decreased considerably.  Anatomical architecture as present and looks like is doing pretty well.  He has good plantar flexion abduction abduction inversion eversion  Assessment: Well-healing surgical foot.  Plan: At this point I went ahead and recommend he continue his compression garment his high plug-in type boot and that he can try to be slightly more aggressive with his walking.  I am going to reevaluate him in 2 weeks to see if he is ready to go back to work.  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

## 2019-10-28 ENCOUNTER — Other Ambulatory Visit: Payer: BC Managed Care – PPO

## 2019-11-02 ENCOUNTER — Encounter: Payer: BC Managed Care – PPO | Admitting: Podiatry

## 2019-11-04 ENCOUNTER — Encounter: Payer: Self-pay | Admitting: Podiatry

## 2019-11-04 ENCOUNTER — Ambulatory Visit (INDEPENDENT_AMBULATORY_CARE_PROVIDER_SITE_OTHER): Payer: BC Managed Care – PPO | Admitting: Podiatry

## 2019-11-04 ENCOUNTER — Other Ambulatory Visit: Payer: Self-pay

## 2019-11-04 DIAGNOSIS — Z9889 Other specified postprocedural states: Secondary | ICD-10-CM

## 2019-11-04 DIAGNOSIS — M722 Plantar fascial fibromatosis: Secondary | ICD-10-CM

## 2019-11-04 DIAGNOSIS — S96912D Strain of unspecified muscle and tendon at ankle and foot level, left foot, subsequent encounter: Secondary | ICD-10-CM

## 2019-11-04 NOTE — Progress Notes (Signed)
He presents today for follow-up of his EPF and the repair of his peroneal brevis tendon states that is doing pretty good sometimes to get a little pinching feeling but other than that is doing really well.  Date of surgery was 09/18/2019.  Objective: Vital signs are stable he is alert oriented x3 wears his boots on a regular basis.  He has mild edema overlying the lateral aspect of the foot no pain on palpation to either surgical site.  Assessment: Well-healing surgical foot.  Plan: I am going to insist that he stay out of work for another 2 weeks just to build up the strength of the peroneal retinacula without overdoing it.  And I will let her go back to work for a week or 2 before I have him back to the office.

## 2019-11-20 ENCOUNTER — Telehealth: Payer: Self-pay | Admitting: *Deleted

## 2019-11-20 NOTE — Telephone Encounter (Signed)
Ryan Ritter completed the forms on 11/11/2019 and faxed it to [employer].

## 2019-11-20 NOTE — Telephone Encounter (Signed)
"  I was just calling about the FMLA form.  There's page that needs some check marks on it.  Could you call so we can talk about it?"

## 2019-12-10 ENCOUNTER — Ambulatory Visit: Payer: BC Managed Care – PPO | Admitting: Family Medicine

## 2019-12-14 ENCOUNTER — Ambulatory Visit: Payer: BC Managed Care – PPO | Admitting: Family Medicine

## 2019-12-14 ENCOUNTER — Other Ambulatory Visit: Payer: Self-pay

## 2019-12-14 ENCOUNTER — Encounter: Payer: Self-pay | Admitting: Family Medicine

## 2019-12-14 VITALS — BP 110/68 | HR 65 | Temp 97.3°F | Resp 16 | Ht 72.0 in | Wt 222.8 lb

## 2019-12-14 DIAGNOSIS — E1165 Type 2 diabetes mellitus with hyperglycemia: Secondary | ICD-10-CM | POA: Diagnosis not present

## 2019-12-14 DIAGNOSIS — E785 Hyperlipidemia, unspecified: Secondary | ICD-10-CM | POA: Diagnosis not present

## 2019-12-14 DIAGNOSIS — Z5181 Encounter for therapeutic drug level monitoring: Secondary | ICD-10-CM

## 2019-12-14 DIAGNOSIS — I10 Essential (primary) hypertension: Secondary | ICD-10-CM | POA: Diagnosis not present

## 2019-12-14 MED ORDER — LOSARTAN POTASSIUM 25 MG PO TABS: 12.5000 mg | ORAL_TABLET | Freq: Every day | ORAL | 11 refills | Status: DC

## 2019-12-14 NOTE — Progress Notes (Signed)
Name: BURREL LEGRAND   MRN: [id number]    DOB: 1966/04/11   Date:12/14/2019       Progress Note  Chief Complaint  Patient presents with  . Follow-up  . Diabetes  . Hypertension  . Hyperlipidemia     Subjective:   Ryan Ritter is a 54 y.o. male, presents to clinic for routine follow up on the conditions listed above.  Diabetes Mellitus Type II: Currently managing with metformin 1000 mg BID, recent dose increase, some days has trouble with GI SE Pt notes good med compliance Pt has GI SE from meds. Fasting CBGs typically run high CBG in mornings 175, which is normal for him, tends to be lower in the evenings for him No hypoglycemic episodes Denies: Polyuria, polydipsia, polyphagia, vision changes, or neuropathy Recent pertinent labs: Lab Results  Component Value Date   HGBA1C 6.7 (H) 09/10/2019   HGBA1C 6.2 (H) 03/10/2019   HGBA1C 5.8 (H) 09/02/2018   Sore spot on his left plantar foot under central MT/MTP joint area - he has f/up with Dr. Victorino Dike - spot is from wearing boot after left ankle and foot surgery Pt is due for DM foot exam and is UTD on eye exam ACEI/ARB: Yes Statin: No    Hypertension:  Currently managed on losartan 12.5 mg daily Pt reports good med compliance and denies any SE.  No lightheadedness, hypotension, syncope. Blood pressure today is well controlled. BP Readings from Last 3 Encounters:  12/14/19 110/68  09/17/19 128/68  09/10/19 122/80   Pt denies CP, SOB, exertional sx, LE edema, palpitation, Ha's, visual disturbances  Hyperlipidemia: vascepa - pt states that it is his triglycerides that are dangerous for his heart and those have improved.  He also notes that HDL is improved to low normal, and he says his LDL is high treating that with statins is killing people faster.  He strongly feels statins are awful.  Notes he did try one in the past and he had horrible dreams.  Previously prescribed zetia and did not take, sounds like he has been taking  vascepa over the past 3 months or so Current Medication Regimen:  vascepa Last Lipids: Lab Results  Component Value Date   CHOL 210 (H) 09/10/2019   HDL 37 (L) 09/10/2019   LDLCALC 150 (H) 09/10/2019   TRIG 111 09/10/2019   CHOLHDL 5.7 (H) 09/10/2019   - Denies: Chest pain, shortness of breath, myalgias. - Documented aortic atherosclerosis? No - Risk factors for atherosclerosis: diabetes mellitus, hypercholesterolemia and hypertension  The 10-year ASCVD risk score Denman George DC Jr., et al., 2013) is: 11.9%   Values used to calculate the score:     Age: 34 years     Sex: Male     Is Non-Hispanic African American: No     Diabetic: Yes     Tobacco smoker: No     Systolic Blood Pressure: 110 mmHg     Is BP treated: Yes     HDL Cholesterol: 37 mg/dL     Total Cholesterol: 210 mg/dL   Patient Active Problem List   Diagnosis Date Noted  . Type 2 diabetes mellitus with hyperglycemia, without long-term current use of insulin (HCC) 03/10/2019  . 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 Vitiligo 09/09/2018  . OSA on CPAP 05/29/2017  . Low HDL (under 40) 11/08/2016  . 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 Family history of colonic polyps 03/14/2015  . Gastroesophageal reflux disease with esophagitis without hemorrhage 03/14/2015  .  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 Hypotestosteronism 03/14/2015  . Family history of malignant neoplasm of prostate 05/21/2008  . Hyperlipidemia LDL goal <70 01/29/2007    Past Surgical History:  Procedure Laterality Date  . ANAL FISSURE REPAIR    . tendinitis Left   . TRIGGER FINGER RELEASE    . vascectomy  08/2014    Family History  Problem Relation Age of Onset  . Diabetes Father   . Pancreatic cancer Father   . Diabetes Brother   . Diabetes Maternal Grandmother   . Breast cancer Mother   . Colon polyps Mother     Social History   Tobacco Use  . Smoking status: Never Smoker  . Smokeless tobacco: Former Systems developer    Types: Chew  Substance  Use Topics  . Alcohol use: No    Alcohol/week: 0.0 standard drinks  . Drug use: No      Current Outpatient Medications:  .  Boron 3 MG CAPS, Take 1 capsule by mouth 3 (three) times daily., Disp: , Rfl:  .  Chromium 200 MCG CAPS, Take 200 mcg by mouth 2 (two) times daily. , Disp: , Rfl:  .  Cinnamon 500 MG TABS, Take 1,000 tablets by mouth 2 (two) times daily., Disp: , Rfl:  .  Cod Liver Oil 5000-500 UNIT/5ML OIL, Take by mouth., Disp: , Rfl:  .  Coenzyme Q10 (CO Q10) 200 MG CAPS, Take 200 mg by mouth daily., Disp: , Rfl:  .  fluticasone (FLONASE) 50 MCG/ACT nasal spray, Place 2 sprays into both nostrils daily., Disp: , Rfl:  .  levocetirizine (XYZAL) 5 MG tablet, Take 5 mg by mouth every evening. , Disp: , Rfl:  .  losartan (COZAAR) 25 MG tablet, Take 0.5 tablets (12.5 mg total) by mouth daily., Disp: 15 tablet, Rfl: 11 .  metFORMIN (GLUCOPHAGE) 1000 MG tablet, TAKE ONE TABLET BY MOUTH TWICE DAILY WITH A MEAL, Disp: 180 tablet, Rfl: 1 .  VASCEPA 1 g capsule, TAKE 2 CAPSULES BY MOUTH TWICE DAILY, Disp: 120 capsule, Rfl: 5 .  EPINEPHrine 0.3 mg/0.3 mL IJ SOAJ injection, epinephrine 0.3 mg/0.3 mL injection, auto-injector, Disp: , Rfl:  .  Saw Palmetto 500 MG CAPS, Take by mouth., Disp: , Rfl:   Allergies  Allergen Reactions  . Benicar [Olmesartan] Other (See Comments)  . Lisinopril Other (See Comments)    Chart Review Today: I personally reviewed active problem list, medication list, allergies, family history, social history, health maintenance, notes from last encounter, lab results, imaging with the patient/caregiver today.  Review of Systems  10 Systems reviewed and are negative for acute change except as noted in the HPI.   Objective:    Vitals:   12/14/19 0802  BP: 110/68  Pulse: 65  Resp: 16  Temp: (!) 97.3 F (36.3 C)  TempSrc: Temporal  SpO2: 98%  Weight: 222 lb 12.8 oz (101.1 kg)  Height: 6' (1.829 m)    Body mass index is 30.22 kg/m.  Physical Exam Vitals  and nursing note reviewed.  Constitutional:      General: He is not in acute distress.    Appearance: Normal appearance. He is well-developed. He is not ill-appearing, toxic-appearing or diaphoretic.     Interventions: Face mask in place.  HENT:     Head: Normocephalic and atraumatic.     Jaw: No trismus.     Right Ear: External ear normal.     Left Ear: External ear normal.  Eyes:     General: Lids  are normal. No scleral icterus.    Conjunctiva/sclera: Conjunctivae normal.     Pupils: Pupils are equal, round, and reactive to light.  Neck:     Trachea: Trachea and phonation normal. No tracheal deviation.  Cardiovascular:     Rate and Rhythm: Normal rate and regular rhythm.     Pulses: Normal pulses.          Radial pulses are 2+ on the right side and 2+ on the left side.       Posterior tibial pulses are 2+ on the right side and 2+ on the left side.     Heart sounds: Normal heart sounds. No murmur. No friction rub. No gallop.   Pulmonary:     Effort: Pulmonary effort is normal. No respiratory distress.     Breath sounds: Normal breath sounds. No stridor. No wheezing, rhonchi or rales.  Abdominal:     General: Bowel sounds are normal. There is no distension.     Palpations: Abdomen is soft.     Tenderness: There is no abdominal tenderness. There is no guarding or rebound.  Musculoskeletal:        General: Normal range of motion.     Cervical back: Normal range of motion and neck supple.     Right lower leg: No edema.     Left lower leg: No edema.  Skin:    General: Skin is warm and dry.     Capillary Refill: Capillary refill takes less than 2 seconds.     Coloration: Skin is not jaundiced.     Findings: No rash.     Nails: There is no clubbing.  Neurological:     Mental Status: He is alert.     Cranial Nerves: No dysarthria or facial asymmetry.     Motor: No tremor or abnormal muscle tone.     Gait: Gait normal.  Psychiatric:        Mood and Affect: Mood normal.         Speech: Speech normal.        Behavior: Behavior normal. Behavior is cooperative.       Diabetic Foot Exam: Diabetic Foot Exam - Simple   Simple Foot Form Diabetic Foot exam was performed with the following findings: Yes 12/14/2019  8:25 AM  Visual Inspection Sensation Testing Pulse Check Comments     PHQ2/9: Depression screen [facility] 2/9 12/14/2019 09/17/2019 09/10/2019 03/10/2019 09/09/2018  Decreased Interest 0 0 0 0 0  Down, Depressed, Hopeless 0 0 0 0 0  PHQ - 2 Score 0 0 0 0 0  Altered sleeping 0 0 0 0 0  Tired, decreased energy 0 0 1 0 0  Change in appetite 0 0 0 0 0  Feeling bad or failure about yourself  0 0 0 0 0  Trouble concentrating 0 0 0 0 0  Moving slowly or fidgety/restless 0 0 0 0 0  Suicidal thoughts 0 0 0 0 0  PHQ-9 Score 0 0 1 0 0  Difficult doing work/chores Not difficult at all Not difficult at all Not difficult at all Not difficult at all Not difficult at all  Some recent data might be hidden    phq 9 is neg, reviewed today  Fall Risk: Fall Risk  12/14/2019 09/17/2019 09/10/2019 03/10/2019 10/09/2018  Falls in the past year? 0 0 0 0 0  Number falls in past yr: 0 0 0 0 -  Injury with Fall? 0 0 0 0 -  Follow  up - Falls evaluation completed Falls evaluation completed Falls evaluation completed -    Functional Status Survey: Is the patient deaf or have difficulty hearing?: No Does the patient have difficulty seeing, even when wearing glasses/contacts?: No Does the patient have difficulty concentrating, remembering, or making decisions?: No Does the patient have difficulty walking or climbing stairs?: No Does the patient have difficulty dressing or bathing?: No Does the patient have difficulty doing errands alone such as visiting a doctor's office or shopping?: No   Assessment & Plan:     ICD-10-CM   1. Type 2 diabetes mellitus with hyperglycemia, without long-term current use of insulin (HCC)  E11.65 Hemoglobin A1c    COMPLETE METABOLIC PANEL WITH GFR     losartan (COZAAR) 25 MG tablet   tolerating 1000 mg metformin BID most days, occassionally holds 500 mg due to GI se, CBGs sound elevated, but hx of DM being well controlled  2. Benign essential HTN  I10    on losartan 12.5 mg dose for renal protection, BP optimal, check labs  3. Hyperlipidemia LDL goal <70  E78.5 COMPLETE METABOLIC PANEL WITH GFR    Lipid panel   on vascepa only, attempted to explain LDL relation to CVD/atherosclerosis, but pt did not seem receptive, gave handout on diet/lifestyle tx for HLD from [facility]  4. Encounter for medication monitoring  Z51.81 Hemoglobin A1c    COMPLETE METABOLIC PANEL WITH GFR    Lipid panel    Return in about 6 months (around 06/15/2020) for routhine f/up dm, hld    f/up with PCP.   [name], PA-C 12/14/19 9:13 AM

## 2019-12-14 NOTE — Patient Instructions (Signed)
See handouts about cholesterol and healthy diet and exercise recommendations from american heart association.

## 2019-12-15 LAB — COMPLETE METABOLIC PANEL WITH GFR
AG Ratio: 2.3 (calc) (ref 1.0–2.5)
ALT: 11 U/L (ref 9–46)
AST: 12 U/L (ref 10–35)
Albumin: 4.5 g/dL (ref 3.6–5.1)
Alkaline phosphatase (APISO): 34 U/L — ABNORMAL LOW (ref 35–144)
BUN: 19 mg/dL (ref 7–25)
CO2: 27 mmol/L (ref 20–32)
Calcium: 9.2 mg/dL (ref 8.6–10.3)
Chloride: 105 mmol/L (ref 98–110)
Creat: 0.82 mg/dL (ref 0.70–1.33)
GFR, Est African American: 116 mL/min/{1.73_m2} (ref >=60)
GFR, Est Non African American: 100 mL/min/{1.73_m2} (ref >=60)
Globulin: 2 g/dL (calc) (ref 1.9–3.7)
Glucose, Bld: 220 mg/dL — ABNORMAL HIGH (ref 65–99)
Potassium: 4.5 mmol/L (ref 3.5–5.3)
Sodium: 138 mmol/L (ref 135–146)
Total Bilirubin: 0.7 mg/dL (ref 0.2–1.2)
Total Protein: 6.5 g/dL (ref 6.1–8.1)

## 2019-12-15 LAB — LIPID PANEL
Cholesterol: 204 mg/dL — ABNORMAL HIGH (ref <200)
HDL: 40 mg/dL (ref >=40)
LDL Cholesterol (Calc): 138 mg/dL (calc) — ABNORMAL HIGH
Non-HDL Cholesterol (Calc): 164 mg/dL (calc) — ABNORMAL HIGH (ref <130)
Total CHOL/HDL Ratio: 5.1 (calc) — ABNORMAL HIGH (ref <5.0)
Triglycerides: 135 mg/dL (ref <150)

## 2019-12-15 LAB — HEMOGLOBIN A1C
Hgb A1c MFr Bld: 6.5 % of total Hgb — ABNORMAL HIGH (ref <5.7)
Mean Plasma Glucose: 140 (calc)
eAG (mmol/L): 7.7 (calc)

## 2019-12-16 ENCOUNTER — Ambulatory Visit (INDEPENDENT_AMBULATORY_CARE_PROVIDER_SITE_OTHER): Payer: BC Managed Care – PPO | Admitting: Podiatry

## 2019-12-16 ENCOUNTER — Encounter: Payer: Self-pay | Admitting: Podiatry

## 2019-12-16 ENCOUNTER — Other Ambulatory Visit: Payer: Self-pay

## 2019-12-16 DIAGNOSIS — M722 Plantar fascial fibromatosis: Secondary | ICD-10-CM

## 2019-12-16 DIAGNOSIS — S96912D Strain of unspecified muscle and tendon at ankle and foot level, left foot, subsequent encounter: Secondary | ICD-10-CM

## 2019-12-16 DIAGNOSIS — Z9889 Other specified postprocedural states: Secondary | ICD-10-CM

## 2019-12-16 NOTE — Progress Notes (Signed)
He presents today date of surgery 09/18/2019 status post EPF left and repair of peroneus brevis tendon left.  He states that the surgery part feels pretty good for the most part however I do still have some soreness to the forefoot left.  He states that he fashions a pad daily to put beneath the second metatarsophalangeal joint.  He states that this is the area that become thick and painful while wearing that boot and the skin is recently he peeled off making it very tender.  Objective: Vital signs are stable he is alert and oriented x3 there is no erythema edema cellulitis drainage or odor to the left foot.  No reproducible pain on palpation of either of the surgical sites.  He has no tenderness on palpation of the second third or fourth metatarsophalangeal joints.  He does have an area of new skin growth beneath the second metatarsal which is mildly tender on palpation.  Assessment: Well-healing surgical foot possible plantarflexed second metatarsal continue the use of the orthotics with a cut out and not provided him with some extra padding for the orthotics in the midfoot.  I will follow-up with him on an as-needed basis.  Plan: Continue current therapies follow-up with me as needed

## 2019-12-24 ENCOUNTER — Ambulatory Visit: Payer: BC Managed Care – PPO | Attending: Internal Medicine

## 2019-12-24 DIAGNOSIS — Z23 Encounter for immunization: Secondary | ICD-10-CM

## 2019-12-24 NOTE — Progress Notes (Signed)
   Covid-19 Vaccination Clinic  Name:  Ryan Ritter    MRN: [id number] DOB: June 09, 1966  12/24/2019  [name] was observed post Covid-19 immunization for 15 minutes without incident. He was provided with Vaccine Information Sheet and instruction to access the V-Safe system.   [name] was instructed to call 911 with any severe reactions post vaccine: Marland Kitchen Difficulty breathing  . Swelling of face and throat  . A fast heartbeat  . A bad rash all over body  . Dizziness and weakness   Immunizations Administered    Name Date Dose VIS Date Route   Pfizer COVID-19 Vaccine 12/24/2019 12:54 PM 0.3 mL 09/11/2019 Intramuscular   Manufacturer: [employer], [employer]   Lot: [id number]   NDC: [address]-2     2

## 2020-01-18 ENCOUNTER — Ambulatory Visit: Payer: BC Managed Care – PPO | Attending: Internal Medicine

## 2020-01-18 DIAGNOSIS — Z23 Encounter for immunization: Secondary | ICD-10-CM

## 2020-01-18 NOTE — Progress Notes (Signed)
   Covid-19 Vaccination Clinic  Name:  Ryan Ritter    MRN: [id number] DOB: 03/06/1966  01/18/2020  [name] was observed post Covid-19 immunization for 15 minutes   without incident. He was provided with Vaccine Information Sheet and instruction to access the V-Safe system.   [name] was instructed to call 911 with any severe reactions post vaccine: Marland Kitchen Difficulty breathing  . Swelling of face and throat  . A fast heartbeat  . A bad rash all over body  . Dizziness and weakness   Immunizations Administered    Name Date Dose VIS Date Route   Pfizer COVID-19 Vaccine 01/18/2020 11:38 AM 0.3 mL 11/25/2018 Intramuscular   Manufacturer: [employer], [employer]   Lot: [id number]   NDC: [address]-6

## 2020-03-22 ENCOUNTER — Telehealth: Payer: Self-pay

## 2020-03-22 DIAGNOSIS — E1165 Type 2 diabetes mellitus with hyperglycemia: Secondary | ICD-10-CM

## 2020-03-22 MED ORDER — METFORMIN HCL 1000 MG PO TABS: ORAL_TABLET | ORAL | 0 refills | Status: DC

## 2020-03-23 NOTE — Telephone Encounter (Signed)
lvm for scheduling °

## 2020-05-10 ENCOUNTER — Encounter: Payer: Self-pay | Admitting: Family Medicine

## 2020-06-16 ENCOUNTER — Ambulatory Visit: Payer: BC Managed Care – PPO | Admitting: Family Medicine

## 2020-07-04 ENCOUNTER — Other Ambulatory Visit: Payer: Self-pay | Admitting: Family Medicine

## 2020-07-04 DIAGNOSIS — E1165 Type 2 diabetes mellitus with hyperglycemia: Secondary | ICD-10-CM

## 2020-07-05 ENCOUNTER — Other Ambulatory Visit: Payer: Self-pay

## 2020-07-05 MED ORDER — VASCEPA 1 G PO CAPS: 2.0000 g | ORAL_CAPSULE | Freq: Two times a day (BID) | ORAL | 5 refills | Status: DC

## 2020-07-25 NOTE — Progress Notes (Signed)
Patient ID: Ryan Ritter, male    DOB: July 05, 1966, 54 y.o.   MRN: [id number]  PCP: Jamelle Haring, MD  Chief Complaint  Patient presents with  . Diabetes  . Hyperlipidemia    Subjective:   Ryan Ritter is a 54 y.o. male, presents to clinic with CC of the following:  Chief Complaint  Patient presents with  . Diabetes  . Hyperlipidemia    HPI:  Patient is a 54 year old male former patient of Ryan Ritter Last visit was with Ryan Ritter on 12/14/2019 Communication after the lab results obtained from that visit included the following:  Your A1c is still well controlled and improved a little bit since your last labs, was 6.5 Your kidney function, liver function, electrolytes were all normal and good. Your total cholesterol and LDL (aka bad) cholesterol were elevated.  A statin medication is indicated and recommended by American Heart Association and American College of Cardiology for patients with a diagnosis of diabetes or with a 10-year ASCVD risk score of greater than 7.5%. You can see your score below.  The 10-year ASCVD risk score Ryan Ritter DC Montez Hageman., et al., 2013) is: 10.8% Values used to calculate the score:  Age: 41 years  Sex: Male  Is Non-Hispanic African American: No  Diabetic: Yes  Tobacco smoker: No  Systolic Blood Pressure: 110 mmHg  Is BP treated: Yes  HDL Cholesterol: 40 mg/dL  Total Cholesterol: 003 mg/dL  You can look up the American Heart Association website to read more about prevention, cholesterol or statin medications. I understand that you do not want to take a statin medication and it is your choice. Is my job to give you information until you my medical recommendation and it is up to you to decide what you would like to do. I would recommend starting a low-dose statin like Crestor 10 mg once daily at bedtime. Please let us know if you ever feel willing to try a statin medication and we will send one in for  you.  In the meantime working on healthy lifestyle, diet and exercising can always optimize health and decrease cardiovascular disease risk. Try and avoid saturated fat and transfat in your diet, have plenty of vegetables and whole grains, and add in or increase aerobic exercise.  Please let me know if you have any questions  Follows up today. All in all is feeling well, has no complaints   Diabetes Mellitus Type II: Currently managing with metformin 1000 mg BID, many days has trouble with GI SE, and lessens dose to a half often, especially when fasting which he is doing every other day presently Does check sugars at home only occasionally-fasting CBGs typically run higher 140-150, lower in the evenings No hypoglycemic episodes Lab Results  Component Value Date   HGBA1C 6.5 (H) 12/14/2019   HGBA1C 6.7 (H) 09/10/2019   HGBA1C 6.2 (H) 03/10/2019   Lab Results  Component Value Date   MICROALBUR 0.4 09/10/2019   LDLCALC 138 (H) 12/14/2019   CREATININE 0.82 12/14/2019    Denies: Polyuria, polydipsia, numbness or tingling in the extremities,  Last eye exam - December 2020, scheduled for Dec 2021 Left diabetic foot exam last visit ACEI/ARB: Yes Statin: No   Hypertension:  Currently managed on losartan 12.5 mg daily, noted he takes this more to protect his kidneys than for blood pressure issues Pt reports good med compliance  BP Readings from Last 3 Encounters:  07/26/20 124/70  12/14/19 110/68  09/17/19  128/68   Pt denies CP, palpitations, SOB, increased LE edema, increased headaches, visual disturbances  Hyperlipidemia: Medication regimen - vascepa, asked if he needs to continue taking this today, and noting he refuses to take a statin, do think this is probably helpful.  He also notes that HDL has improved to low normal, and previously noted that his LDL is high.  He strongly feels statins are awful.  Notes he did try one in the past and he had horrible dreams and memory  issues.  He noted he will not take 1 in the future.  Lab Results  Component Value Date   CHOL 204 (H) 12/14/2019   HDL 40 12/14/2019   LDLCALC 138 (H) 12/14/2019   TRIG 135 12/14/2019   CHOLHDL 5.1 (H) 12/14/2019    Obesity/overweight is now the category with his BMI less than 30: Wt Readings from Last 3 Encounters:  07/26/20 215 lb 1.6 oz (97.6 kg)  12/14/19 222 lb 12.8 oz (101.1 kg)  09/17/19 226 lb 11.2 oz (102.8 kg)   Working hard on losing weight with intermittent fasting, has good results Diet-has done keto and intermittent fasting over the last 2 years and feels this really helps him to maintain his weight.  He had a class with a [facility] physician to learn more about Keto for diabetics.  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 Recommended trying to increase aerobic activity.  Highest weight was 299lbs.  GERD:No issues since doing Keto.  Has been asymptomatic iwithout medications.   ZHY:QMVHQIO CPAP nightly and comfortable with it.  Sleeping well, wearing the entire night.  Patient Active Problem List   Diagnosis Date Noted  . Type 2 diabetes mellitus with hyperglycemia, without long-term current use of insulin (HCC) 03/10/2019  . 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 Vitiligo 09/09/2018  . OSA on CPAP 05/29/2017  . Low HDL (under 40) 11/08/2016  . 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 Family history of colonic polyps 03/14/2015  . Gastroesophageal reflux disease with esophagitis without hemorrhage 03/14/2015  . 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 Hypotestosteronism 03/14/2015  . Family history of malignant neoplasm of prostate 05/21/2008  . Hyperlipidemia LDL goal <70 01/29/2007      Current Outpatient Medications:  .  Boron 3 MG CAPS, Take 1 capsule by mouth 3 (three) times daily., Disp: , Rfl:  .  Chromium 200 MCG CAPS, Take 200 mcg by mouth 2 (two) times daily.  , Disp: , Rfl:  .  Cinnamon 500 MG TABS, Take 1,000 tablets by mouth 2 (two) times daily., Disp: , Rfl:  .  Cod Liver Oil 5000-500 UNIT/5ML OIL, Take by mouth., Disp: , Rfl:  .  Coenzyme Q10 (CO Q10) 200 MG CAPS, Take 200 mg by mouth daily., Disp: , Rfl:  .  EPINEPHrine 0.3 mg/0.3 mL IJ SOAJ injection, epinephrine 0.3 mg/0.3 mL injection, auto-injector, Disp: , Rfl:  .  levocetirizine (XYZAL) 5 MG tablet, Take 5 mg by mouth every evening. , Disp: , Rfl:  .  losartan (COZAAR) 25 MG tablet, Take 0.5 tablets (12.5 mg total) by mouth daily., Disp: 15 tablet, Rfl: 11 .  metFORMIN (GLUCOPHAGE) 1000 MG tablet, TAKE 1 TABLET BY MOUTH TWICE DAILY WITH A MEAL, Disp: 180 tablet, Rfl: 1 .  Saw Palmetto 500 MG CAPS, Take by mouth., Disp: , Rfl:  .  VASCEPA 1 g capsule, Take 2 capsules (2 g total)  by mouth 2 (two) times daily., Disp: 120 capsule, Rfl: 5   Allergies  Allergen Reactions  . Benicar [Olmesartan] Other (See Comments)  . Lisinopril Other (See Comments)     Past Surgical History:  Procedure Laterality Date  . ANAL FISSURE REPAIR    . tendinitis Left   . TRIGGER FINGER RELEASE    . vascectomy  08/2014     Family History  Problem Relation Age of Onset  . Diabetes Father   . Pancreatic cancer Father   . Diabetes Brother   . Diabetes Maternal Grandmother   . Breast cancer Mother   . Colon polyps Mother      Social History   Tobacco Use  . Smoking status: Never Smoker  . Smokeless tobacco: Former Neurosurgeon    Types: Chew  Substance Use Topics  . Alcohol use: No    Alcohol/week: 0.0 standard drinks    With staff assistance, above reviewed with the patient today.  ROS: As per HPI, otherwise no specific complaints on a limited and focused system review   No results found for this or any previous visit (from the past 72 hour(s)).   PHQ2/9: Depression screen [facility] 2/9 12/14/2019 09/17/2019 09/10/2019 03/10/2019 09/09/2018  Decreased Interest 0 0 0 0 0  Down, Depressed, Hopeless 0  0 0 0 0  PHQ - 2 Score 0 0 0 0 0  Altered sleeping 0 0 0 0 0  Tired, decreased energy 0 0 1 0 0  Change in appetite 0 0 0 0 0  Feeling bad or failure about yourself  0 0 0 0 0  Trouble concentrating 0 0 0 0 0  Moving slowly or fidgety/restless 0 0 0 0 0  Suicidal thoughts 0 0 0 0 0  PHQ-9 Score 0 0 1 0 0  Difficult doing work/chores Not difficult at all Not difficult at all Not difficult at all Not difficult at all Not difficult at all  Some recent data might be hidden   PHQ-2/9 history reviewed  Fall Risk: Fall Risk  07/26/2020 12/14/2019 09/17/2019 09/10/2019 03/10/2019  Falls in the past year? 0 0 0 0 0  Number falls in past yr: 0 0 0 0 0  Injury with Fall? 0 0 0 0 0  Follow up - - Falls evaluation completed Falls evaluation completed Falls evaluation completed      Objective:   Vitals:   07/26/20 0750  BP: 124/70  Pulse: 73  Resp: 16  Temp: 97.8 F (36.6 C)  TempSrc: Oral  SpO2: 98%  Weight: 215 lb 1.6 oz (97.6 kg)  Height: 6' (1.829 m)    Body mass index is 29.17 kg/m.  Physical Exam   NAD, masked, pleasant HEENT - [place]/AT, sclera anicteric, PERRL, positive glasses, EOMI, conj - non-inj'ed, pharynx clear Neck - supple, no adenopathy, no TM, carotids 2+ and = without bruits bilat Car - RRR without m/g/r Pulm- RR and effort normal at rest, CTA without wheeze or rales Abd - soft, NT diffusely, ND, Back - no CVA tenderness Ext - no LE edema,  Neuro/psychiatric - affect was not flat, appropriate with conversation  Alert with normal speech  Grossly non-focal - good strength on testing extremities, sensation intact to LT in distal extremities  Results for orders placed or performed in visit on 12/14/19  Hemoglobin A1c  Result Value Ref Range   Hgb A1c MFr Bld 6.5 (H) <5.7 % of total Hgb   Mean Plasma Glucose 140 (calc)  eAG (mmol/L) 7.7 (calc)  COMPLETE METABOLIC PANEL WITH GFR  Result Value Ref Range   Glucose, Bld 220 (H) 65 - 99 mg/dL   BUN 19 7 - 25 mg/dL     Creat 8.110.82 9.140.70 - 7.821.33 mg/dL   GFR, Est Non African American 100 > OR = 60 mL/min/1.3873m2   GFR, Est African American 116 > OR = 60 mL/min/1.8873m2   BUN/Creatinine Ratio NOT APPLICABLE 6 - 22 (calc)   Sodium 138 135 - 146 mmol/L   Potassium 4.5 3.5 - 5.3 mmol/L   Chloride 105 98 - 110 mmol/L   CO2 27 20 - 32 mmol/L   Calcium 9.2 8.6 - 10.3 mg/dL   Total Protein 6.5 6.1 - 8.1 g/dL   Albumin 4.5 3.6 - 5.1 g/dL   Globulin 2.0 1.9 - 3.7 g/dL (calc)   AG Ratio 2.3 1.0 - 2.5 (calc)   Total Bilirubin 0.7 0.2 - 1.2 mg/dL   Alkaline phosphatase (APISO) 34 (L) 35 - 144 U/L   AST 12 10 - 35 U/L   ALT 11 9 - 46 U/L  Lipid panel  Result Value Ref Range   Cholesterol 204 (H) <200 mg/dL   HDL 40 > OR = 40 mg/dL   Triglycerides 956135 <213<150 mg/dL   LDL Cholesterol (Calc) 138 (H) mg/dL (calc)   Total CHOL/HDL Ratio 5.1 (H) <5.0 (calc)   Non-HDL Cholesterol (Calc) 164 (H) <130 mg/dL (calc)   Last labs reviewed Assessment & Plan:    1. Type 2 diabetes mellitus with hyperglycemia, without long-term current use of insulin (HCC) We will check an A1c today as well as a BMP and urine for microalbumin. He thinks sugars have been fairly well controlled in the recent past, and continues on a Metformin product, although sometimes adjust the doses due to GI side effects as noted above.  Notes has really tried to improve his diet to help with blood sugar control. - Hemoglobin A1c - BASIC METABOLIC PANEL WITH GFR - Microalbumin / creatinine urine ratio  2. Benign essential HTN Blood pressures have been good, and notes the losartan product is mainly to help protect his kidneys than a need for blood pressure control. - BASIC METABOLIC PANEL WITH GFR  3. Hyperlipidemia LDL goal <70 Has refused to go on a statin in the past, despite recommendations to do so. Prior lipid panel reviewed again today, and again recommended a statin.  He continues to not want to take 1 presently.  will continue a Vascepa product  presently. Continue with dietary modifications and trying to increase physical activity levels recommended.  4.  Overweight Has had recent success with dietary modifications including intermittent fasting and avoiding carbs in his diet. Encouraged to continue with the dietary modifications, and try to increase his physical activity in combination to help.  5. OSA on CPAP Continues on CPAP and doing well  6. Gastroesophageal reflux disease with esophagitis without hemorrhage Denies any recent concerns with reflux symptoms.   Await labs ordered today, and will follow up again in March, and recheck his lipid status and other labs again at that time. Follow-up sooner as needed. He did get the flu shot today    Jamelle HaringLIFFORD D Margerite Impastato, MD 07/26/20 8:14 AM

## 2020-07-26 ENCOUNTER — Other Ambulatory Visit: Payer: Self-pay

## 2020-07-26 ENCOUNTER — Ambulatory Visit: Payer: BC Managed Care – PPO | Admitting: Internal Medicine

## 2020-07-26 ENCOUNTER — Encounter: Payer: Self-pay | Admitting: Internal Medicine

## 2020-07-26 VITALS — BP 124/70 | HR 73 | Temp 97.8°F | Resp 16 | Ht 72.0 in | Wt 215.1 lb

## 2020-07-26 DIAGNOSIS — E785 Hyperlipidemia, unspecified: Secondary | ICD-10-CM

## 2020-07-26 DIAGNOSIS — I10 Essential (primary) hypertension: Secondary | ICD-10-CM

## 2020-07-26 DIAGNOSIS — K2100 Gastro-esophageal reflux disease with esophagitis, without bleeding: Secondary | ICD-10-CM

## 2020-07-26 DIAGNOSIS — E1165 Type 2 diabetes mellitus with hyperglycemia: Secondary | ICD-10-CM | POA: Diagnosis not present

## 2020-07-26 DIAGNOSIS — G4733 Obstructive sleep apnea (adult) (pediatric): Secondary | ICD-10-CM

## 2020-07-26 DIAGNOSIS — Z23 Encounter for immunization: Secondary | ICD-10-CM | POA: Diagnosis not present

## 2020-07-26 DIAGNOSIS — E6609 Other obesity due to excess calories: Secondary | ICD-10-CM

## 2020-07-26 DIAGNOSIS — E663 Overweight: Secondary | ICD-10-CM | POA: Insufficient documentation

## 2020-07-26 DIAGNOSIS — Z9989 Dependence on other enabling machines and devices: Secondary | ICD-10-CM

## 2020-07-27 LAB — BASIC METABOLIC PANEL WITH GFR
BUN/Creatinine Ratio: 31 (calc) — ABNORMAL HIGH (ref 6–22)
BUN: 26 mg/dL — ABNORMAL HIGH (ref 7–25)
CO2: 26 mmol/L (ref 20–32)
Calcium: 9.8 mg/dL (ref 8.6–10.3)
Chloride: 105 mmol/L (ref 98–110)
Creat: 0.84 mg/dL (ref 0.70–1.33)
GFR, Est African American: 115 mL/min/{1.73_m2} (ref >=60)
GFR, Est Non African American: 99 mL/min/{1.73_m2} (ref >=60)
Glucose, Bld: 170 mg/dL — ABNORMAL HIGH (ref 65–99)
Potassium: 4.6 mmol/L (ref 3.5–5.3)
Sodium: 140 mmol/L (ref 135–146)

## 2020-07-27 LAB — MICROALBUMIN / CREATININE URINE RATIO
Creatinine, Urine: 99 mg/dL (ref 20–320)
Microalb Creat Ratio: 4 mcg/mg creat (ref <30)
Microalb, Ur: 0.4 mg/dL

## 2020-07-27 LAB — HEMOGLOBIN A1C
Hgb A1c MFr Bld: 6.1 % of total Hgb — ABNORMAL HIGH (ref <5.7)
Mean Plasma Glucose: 128 (calc)
eAG (mmol/L): 7.1 (calc)

## 2020-08-03 ENCOUNTER — Encounter: Payer: Self-pay | Admitting: Podiatry

## 2020-08-03 ENCOUNTER — Other Ambulatory Visit: Payer: Self-pay

## 2020-08-03 ENCOUNTER — Ambulatory Visit: Payer: BC Managed Care – PPO | Admitting: Podiatry

## 2020-08-03 DIAGNOSIS — M778 Other enthesopathies, not elsewhere classified: Secondary | ICD-10-CM

## 2020-08-03 DIAGNOSIS — S93492A Sprain of other ligament of left ankle, initial encounter: Secondary | ICD-10-CM | POA: Diagnosis not present

## 2020-08-03 NOTE — Progress Notes (Signed)
He presents today states that I am still having pain in my surgical foot.  Is started after wearing the surgical boot and now I have pain in left forefoot under my big toe and second toe.  He states that primarily beneath the second metatarsophalangeal joint he feels like it is he is walking on a rock at times.  States that the orthotics with a cut out is doing much better than not wearing it.  He also states that he will occasionally roll his left ankle.  He is also requesting new orthotics.  Objective: Vital signs are stable he is alert and oriented x3.  Pulses are palpable.  He has pain on end range of motion dorsiflexion plantarflexion of the second metatarsophalangeal joint left foot.  Majority of his pain is plantarly with an area of mild erythema.  No reactive hyperkeratotic lesion is noted.  Peroneal tendons are intact and he has no pain on palpation of the plantar fascial for the EPF was performed.  Assessment: Well-healed surgical foot with some residual complications consisting of numbness as well as capsulitis at the second metatarsophalangeal joint.  Plan: At this point I recommended new orthotics and also injected dexamethasone some second metatarsophalangeal joint left foot.  He will follow up with me once he gets his new orthotics if he is not completely pain-free.

## 2020-08-04 NOTE — Addendum Note (Signed)
Addended by: Kristian Covey on: 08/04/2020 08:44 AM   Modules accepted: Level of Service

## 2020-08-11 ENCOUNTER — Other Ambulatory Visit: Payer: Self-pay

## 2020-08-11 ENCOUNTER — Ambulatory Visit (INDEPENDENT_AMBULATORY_CARE_PROVIDER_SITE_OTHER): Payer: BC Managed Care – PPO | Admitting: Orthotics

## 2020-08-11 DIAGNOSIS — M778 Other enthesopathies, not elsewhere classified: Secondary | ICD-10-CM

## 2020-08-11 DIAGNOSIS — S93492A Sprain of other ligament of left ankle, initial encounter: Secondary | ICD-10-CM

## 2020-08-11 NOTE — Progress Notes (Signed)
Patient came in today for casting new f/o, however he wants same type as before w/ 2nd met offloads b/l.  He is wanting two pair. 

## 2020-08-31 LAB — HM DIABETES EYE EXAM

## 2020-10-03 ENCOUNTER — Other Ambulatory Visit: Payer: Self-pay

## 2020-10-03 ENCOUNTER — Encounter: Payer: Self-pay | Admitting: Podiatry

## 2020-10-03 ENCOUNTER — Ambulatory Visit: Payer: BC Managed Care – PPO | Admitting: Podiatry

## 2020-10-03 DIAGNOSIS — M778 Other enthesopathies, not elsewhere classified: Secondary | ICD-10-CM

## 2020-10-03 NOTE — Progress Notes (Signed)
He presents today for follow-up of his plantar lateral aspect of his left foot pain as well as capsulitis to the second metatarsophalangeal joint left foot.  He states that currently we capsulitis has improved but still experiencing some lateral pain.  States that every once in a while he gets a sharp pain that lasts no more than a day at the endoscopic plantar fasciotomy site left.  He states that he has not tried his new orthotics yet.  Objective: Vital signs are stable he is alert and oriented x3.  Pulses are palpable.  No erythema edema cellulitis drainage or odor he has no reproducible pain at this point on palpation of the cyst second metatarsophalangeal joint plantar flexion however does cause some increase in symptoms.  Dorsiflexion minimal.  No reproducible pain laterally.  No fluid around the posterior tibial tendon.  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  Most likely compensatory capsulitis at the level of the fourth fifth TMT joint.  Cannot rule out a tear around the second metatarsophalangeal joint.  Plan: At this point we discussed the possible need for surgical intervention he would like to hold off on this at this point time.  We did discuss the possible MRI but he would like to hold off on this until he calls me indicating that we would need one.  If he does call and wants an MRI we will have an MRI forefoot left hoping to include the fourth fifth TMT and the second metatarsophalangeal joint.

## 2020-10-27 ENCOUNTER — Other Ambulatory Visit: Payer: Self-pay | Admitting: Family Medicine

## 2020-10-27 DIAGNOSIS — E1165 Type 2 diabetes mellitus with hyperglycemia: Secondary | ICD-10-CM

## 2021-01-10 NOTE — Progress Notes (Signed)
Name: Ryan Ritter   MRN: [id number]    DOB: 03-28-1966   Date:01/11/2021       Progress Note  Subjective  Chief Complaint  Follow Up  HPI  DMII: he has associated dyslipidemia,and obesity. A1C is up today at 7.1 % and he has gained weight since last visit. He states from July to Dec 2021 he was fasting every other day, but not as restrictive He stopped vascepa but has been eating fish about 5 - 7 days a week and also tree nuts. He cannot tolerate statins  ( states it caused memory loss) , discussed zetia and PCSK 9 inhibitors and he will do some research about it. He has a history of microalbuminuria - he was on low dose losartan 12.5 mg but stopped months ago, we will recheck it today . He states even though compliant with his diet, glucose has been going up to 200 fasting   OSA: he is using CPAP machine.   Obesity: he is on a low carb diet, trying to lose weight    Patient Active Problem List   Diagnosis Date Noted  . Statin intolerance 01/11/2021  . Overweight (BMI 25.0-29.9) 07/26/2020  . Type 2 diabetes mellitus with hyperglycemia, without long-term current use of insulin (HCC) 03/10/2019  . 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 Vitiligo 09/09/2018  . OSA on CPAP 05/29/2017  . Low HDL (under 40) 11/08/2016  . 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 Family history of colonic polyps 03/14/2015  . Gastroesophageal reflux disease with esophagitis without hemorrhage 03/14/2015  . 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 Hypotestosteronism 03/14/2015  . Family history of malignant neoplasm of prostate 05/21/2008  . Hyperlipidemia LDL goal <70 01/29/2007    Past Surgical History:  Procedure Laterality Date  . ANAL FISSURE REPAIR    . tendinitis Left   . TRIGGER FINGER RELEASE    . vascectomy  08/2014    Family History  Problem Relation Age of Onset  . Diabetes Father   . Pancreatic cancer Father   . Diabetes Brother   . Diabetes Maternal Grandmother   . Breast cancer Mother    . Colon polyps Mother     Social History   Tobacco Use  . Smoking status: Never Smoker  . Smokeless tobacco: Former Neurosurgeon    Types: Chew  Substance Use Topics  . Alcohol use: No    Alcohol/week: 0.0 standard drinks     Current Outpatient Medications:  .  Boron 3 MG CAPS, Take 1 capsule by mouth 3 (three) times daily., Disp: , Rfl:  .  Chromium 200 MCG CAPS, Take 200 mcg by mouth 2 (two) times daily. , Disp: , Rfl:  .  Cinnamon 500 MG TABS, Take 1,000 tablets by mouth 2 (two) times daily., Disp: , Rfl:  .  Cod Liver Oil 5000-500 UNIT/5ML OIL, Take by mouth., Disp: , Rfl:  .  Coenzyme Q10 (CO Q10) 200 MG CAPS, Take 200 mg by mouth daily., Disp: , Rfl:  .  EPINEPHrine 0.3 mg/0.3 mL IJ SOAJ injection, epinephrine 0.3 mg/0.3 mL injection, auto-injector, Disp: , Rfl:  .  fluticasone (FLONASE) 50 MCG/ACT nasal spray, 2 sprays in each nostril Once a day Nasally 30 days, Disp: , Rfl:  .  levocetirizine (XYZAL) 5 MG tablet, Take 5 mg by mouth every evening. , Disp: , Rfl:  .  metFORMIN (GLUCOPHAGE XR) 750 MG 24 hr tablet, Take 2 tablets (1,500 mg total) by mouth daily with breakfast., Disp: 180  tablet, Rfl: 1  Allergies  Allergen Reactions  . Benicar [Olmesartan] Other (See Comments)  . Lisinopril Other (See Comments)    I personally reviewed active problem list, medication list, allergies, family history, social history, health maintenance with the patient/caregiver today.   ROS  Constitutional: Negative for fever or weight change.  Respiratory: Negative for cough and shortness of breath.   Cardiovascular: Negative for chest pain or palpitations.  Gastrointestinal: Negative for abdominal pain, no bowel changes.  Musculoskeletal: Negative for gait problem or joint swelling.  Skin: Negative for rash.  Neurological: Negative for dizziness or headache.  No other specific complaints in a complete review of systems (except as listed in HPI above).  Objective  Vitals:   01/11/21  1459  BP: 132/70  Pulse: 70  Resp: 16  Temp: 98.4 F (36.9 C)  TempSrc: Oral  SpO2: 98%  Weight: 236 lb (107 kg)  Height: 6' (1.829 m)    Body mass index is 32.01 kg/m.  Physical Exam  Constitutional: Patient appears well-developed and well-nourished. Obese  No distress.  HEENT: head atraumatic, normocephalic, pupils equal and reactive to light,  neck supple Cardiovascular: Normal rate, regular rhythm and normal heart sounds.  No murmur heard. No BLE edema. Pulmonary/Chest: Effort normal and breath sounds normal. No respiratory distress. Abdominal: Soft.  There is no tenderness. Psychiatric: Patient has a normal mood and affect. behavior is normal. Judgment and thought content normal.  Recent Results (from the past 2160 hour(s))  POCT HgB A1C     Status: Abnormal   Collection Time: 01/11/21  3:03 PM  Result Value Ref Range   Hemoglobin A1C 7.1 (A) 4.0 - 5.6 %   HbA1c POC (<> result, manual entry)     HbA1c, POC (prediabetic range)     HbA1c, POC (controlled diabetic range)      Diabetic Foot Exam: Diabetic Foot Exam - Simple   Simple Foot Form Visual Inspection No deformities, no ulcerations, no other skin breakdown bilaterally: Yes Sensation Testing Intact to touch and monofilament testing bilaterally: Yes Pulse Check Posterior Tibialis and Dorsalis pulse intact bilaterally: Yes Comments Web toe between 2nd and 3 rd right toes      PHQ2/9: Depression screen [facility] 2/9 01/11/2021 12/14/2019 09/17/2019 09/10/2019 03/10/2019  Decreased Interest 0 0 0 0 0  Down, Depressed, Hopeless 0 0 0 0 0  PHQ - 2 Score 0 0 0 0 0  Altered sleeping - 0 0 0 0  Tired, decreased energy - 0 0 1 0  Change in appetite - 0 0 0 0  Feeling bad or failure about yourself  - 0 0 0 0  Trouble concentrating - 0 0 0 0  Moving slowly or fidgety/restless - 0 0 0 0  Suicidal thoughts - 0 0 0 0  PHQ-9 Score - 0 0 1 0  Difficult doing work/chores - Not difficult at all Not difficult at all Not  difficult at all Not difficult at all  Some recent data might be hidden    phq 9 is negative  Fall Risk: Fall Risk  01/11/2021 07/26/2020 12/14/2019 09/17/2019 09/10/2019  Falls in the past year? 0 0 0 0 0  Number falls in past yr: 0 0 0 0 0  Injury with Fall? 0 0 0 0 0  Follow up - - - Falls evaluation completed Falls evaluation completed     Functional Status Survey: Is the patient deaf or have difficulty hearing?: No Does the patient have difficulty seeing, even  when wearing glasses/contacts?: No Does the patient have difficulty concentrating, remembering, or making decisions?: Yes (Remembering) Does the patient have difficulty walking or climbing stairs?: No Does the patient have difficulty dressing or bathing?: No Does the patient have difficulty doing errands alone such as visiting a doctor's office or shopping?: No    Assessment & Plan  1. Type 2 diabetes mellitus with hyperglycemia, without long-term current use of insulin (HCC)  - POCT HgB A1C - HM Diabetes Foot Exam - Microalbumin / creatinine urine ratio - metFORMIN (GLUCOPHAGE XR) 750 MG 24 hr tablet; Take 2 tablets (1,500 mg total) by mouth daily with breakfast.  Dispense: 180 tablet; Refill: 1  He does not want to add any new medications. He would like to have a 24 hour glucose monitor to help get A1C to goal    2. OSA on CPAP  - CBC with Differential/Platelet  3. Gastroesophageal reflux disease with esophagitis without hemorrhage   4. Benign essential HTN  - CBC with Differential/Platelet - COMPLETE METABOLIC PANEL WITH GFR  5. Dyslipidemia associated with type 2 diabetes mellitus (HCC)  - Lipid panel - Microalbumin / creatinine urine ratio  6. Need for hepatitis C screening test  - Hepatitis C Antibody  7. Statin intolerance   8. Need for shingles vaccine  - Varicella-zoster vaccine IM

## 2021-01-11 ENCOUNTER — Encounter: Payer: Self-pay | Admitting: Family Medicine

## 2021-01-11 ENCOUNTER — Other Ambulatory Visit: Payer: Self-pay

## 2021-01-11 ENCOUNTER — Ambulatory Visit: Payer: BC Managed Care – PPO | Admitting: Family Medicine

## 2021-01-11 VITALS — BP 132/70 | HR 70 | Temp 98.4°F | Resp 16 | Ht 72.0 in | Wt 236.0 lb

## 2021-01-11 DIAGNOSIS — E1169 Type 2 diabetes mellitus with other specified complication: Secondary | ICD-10-CM

## 2021-01-11 DIAGNOSIS — E785 Hyperlipidemia, unspecified: Secondary | ICD-10-CM

## 2021-01-11 DIAGNOSIS — I10 Essential (primary) hypertension: Secondary | ICD-10-CM | POA: Diagnosis not present

## 2021-01-11 DIAGNOSIS — G4733 Obstructive sleep apnea (adult) (pediatric): Secondary | ICD-10-CM

## 2021-01-11 DIAGNOSIS — Z1159 Encounter for screening for other viral diseases: Secondary | ICD-10-CM

## 2021-01-11 DIAGNOSIS — K2100 Gastro-esophageal reflux disease with esophagitis, without bleeding: Secondary | ICD-10-CM | POA: Diagnosis not present

## 2021-01-11 DIAGNOSIS — Z9989 Dependence on other enabling machines and devices: Secondary | ICD-10-CM

## 2021-01-11 DIAGNOSIS — E1165 Type 2 diabetes mellitus with hyperglycemia: Secondary | ICD-10-CM | POA: Diagnosis not present

## 2021-01-11 DIAGNOSIS — Z23 Encounter for immunization: Secondary | ICD-10-CM | POA: Diagnosis not present

## 2021-01-11 DIAGNOSIS — Z789 Other specified health status: Secondary | ICD-10-CM

## 2021-01-11 LAB — POCT GLYCOSYLATED HEMOGLOBIN (HGB A1C): Hemoglobin A1C: 7.1 % — AB (ref 4.0–5.6)

## 2021-01-11 MED ORDER — DEXCOM G6 SENSOR MISC: 1.0000 | 5 refills | Status: DC

## 2021-01-11 MED ORDER — METFORMIN HCL ER 750 MG PO TB24: 1500.0000 mg | ORAL_TABLET | Freq: Every day | ORAL | 1 refills | Status: DC

## 2021-01-11 MED ORDER — DEXCOM G6 TRANSMITTER MISC: 1.0000 | 5 refills | Status: DC

## 2021-01-11 NOTE — Patient Instructions (Signed)
Zetia is a pill PCSK9 inhibitors injectable.

## 2021-01-13 LAB — LIPID PANEL
Cholesterol: 172 mg/dL (ref <200)
HDL: 34 mg/dL — ABNORMAL LOW (ref >=40)
LDL Cholesterol (Calc): 116 mg/dL (calc) — ABNORMAL HIGH
Non-HDL Cholesterol (Calc): 138 mg/dL (calc) — ABNORMAL HIGH (ref <130)
Total CHOL/HDL Ratio: 5.1 (calc) — ABNORMAL HIGH (ref <5.0)
Triglycerides: 116 mg/dL (ref <150)

## 2021-01-13 LAB — COMPLETE METABOLIC PANEL WITH GFR
AG Ratio: 2.1 (calc) (ref 1.0–2.5)
ALT: 21 U/L (ref 9–46)
AST: 13 U/L (ref 10–35)
Albumin: 4.5 g/dL (ref 3.6–5.1)
Alkaline phosphatase (APISO): 34 U/L — ABNORMAL LOW (ref 35–144)
BUN: 17 mg/dL (ref 7–25)
CO2: 28 mmol/L (ref 20–32)
Calcium: 9.1 mg/dL (ref 8.6–10.3)
Chloride: 103 mmol/L (ref 98–110)
Creat: 0.85 mg/dL (ref 0.70–1.33)
GFR, Est African American: 114 mL/min/{1.73_m2} (ref >=60)
GFR, Est Non African American: 98 mL/min/{1.73_m2} (ref >=60)
Globulin: 2.1 g/dL (calc) (ref 1.9–3.7)
Glucose, Bld: 192 mg/dL — ABNORMAL HIGH (ref 65–99)
Potassium: 4.4 mmol/L (ref 3.5–5.3)
Sodium: 138 mmol/L (ref 135–146)
Total Bilirubin: 1 mg/dL (ref 0.2–1.2)
Total Protein: 6.6 g/dL (ref 6.1–8.1)

## 2021-01-13 LAB — MICROALBUMIN / CREATININE URINE RATIO
Creatinine, Urine: 134 mg/dL (ref 20–320)
Microalb Creat Ratio: 4 mcg/mg creat (ref <30)
Microalb, Ur: 0.5 mg/dL

## 2021-01-13 LAB — CBC WITH DIFFERENTIAL/PLATELET
Absolute Monocytes: 510 cells/uL (ref 200–950)
Basophils Absolute: 38 cells/uL (ref 0–200)
Basophils Relative: 0.6 %
Eosinophils Absolute: 441 cells/uL (ref 15–500)
Eosinophils Relative: 7 %
HCT: 44.3 % (ref 38.5–50.0)
Hemoglobin: 14.7 g/dL (ref 13.2–17.1)
Lymphs Abs: 731 cells/uL — ABNORMAL LOW (ref 850–3900)
MCH: 29.8 pg (ref 27.0–33.0)
MCHC: 33.2 g/dL (ref 32.0–36.0)
MCV: 89.9 fL (ref 80.0–100.0)
MPV: 10.6 fL (ref 7.5–12.5)
Monocytes Relative: 8.1 %
Neutro Abs: 4580 cells/uL (ref 1500–7800)
Neutrophils Relative %: 72.7 %
Platelets: 139 10*3/uL — ABNORMAL LOW (ref 140–400)
RBC: 4.93 10*6/uL (ref 4.20–5.80)
RDW: 12.4 % (ref 11.0–15.0)
Total Lymphocyte: 11.6 %
WBC: 6.3 10*3/uL (ref 3.8–10.8)

## 2021-01-13 LAB — HEPATITIS C ANTIBODY
Hepatitis C Ab: NONREACTIVE
SIGNAL TO CUT-OFF: 0 (ref <1.00)

## 2021-01-16 ENCOUNTER — Encounter: Payer: Self-pay | Admitting: Family Medicine

## 2021-04-17 ENCOUNTER — Ambulatory Visit: Payer: BC Managed Care – PPO | Admitting: Family Medicine

## 2021-06-06 ENCOUNTER — Encounter: Payer: Self-pay | Admitting: Family Medicine

## 2021-06-06 ENCOUNTER — Ambulatory Visit: Payer: BC Managed Care – PPO | Admitting: Family Medicine

## 2021-06-06 ENCOUNTER — Other Ambulatory Visit: Payer: Self-pay

## 2021-06-06 VITALS — BP 138/80 | HR 67 | Temp 98.5°F | Resp 16 | Ht 72.0 in | Wt 236.0 lb

## 2021-06-06 DIAGNOSIS — E669 Obesity, unspecified: Secondary | ICD-10-CM | POA: Diagnosis not present

## 2021-06-06 DIAGNOSIS — E785 Hyperlipidemia, unspecified: Secondary | ICD-10-CM

## 2021-06-06 DIAGNOSIS — Z9989 Dependence on other enabling machines and devices: Secondary | ICD-10-CM

## 2021-06-06 DIAGNOSIS — K2100 Gastro-esophageal reflux disease with esophagitis, without bleeding: Secondary | ICD-10-CM

## 2021-06-06 DIAGNOSIS — E1169 Type 2 diabetes mellitus with other specified complication: Secondary | ICD-10-CM | POA: Diagnosis not present

## 2021-06-06 DIAGNOSIS — Z23 Encounter for immunization: Secondary | ICD-10-CM | POA: Diagnosis not present

## 2021-06-06 DIAGNOSIS — G4733 Obstructive sleep apnea (adult) (pediatric): Secondary | ICD-10-CM

## 2021-06-06 DIAGNOSIS — D696 Thrombocytopenia, unspecified: Secondary | ICD-10-CM | POA: Insufficient documentation

## 2021-06-06 DIAGNOSIS — Z789 Other specified health status: Secondary | ICD-10-CM | POA: Diagnosis not present

## 2021-06-06 DIAGNOSIS — I10 Essential (primary) hypertension: Secondary | ICD-10-CM | POA: Diagnosis not present

## 2021-06-06 LAB — POCT GLYCOSYLATED HEMOGLOBIN (HGB A1C): Hemoglobin A1C: 6.9 % — AB (ref 4.0–5.6)

## 2021-06-06 NOTE — Progress Notes (Signed)
Name: Ryan Ritter   MRN: [id number]    DOB: June 23, 1966   Date:06/06/2021       Progress Note  Subjective  Chief Complaint  Follow Up  HPI  DMII: he has associated dyslipidemia,and obesity. A1C went down from 7.1 % to 6.9 %  he is taking metformin 1500 mg as prescribed. He discontinue Vascepa about one year ago and is eating fish and tree nuts. Marland Kitchen He cannot tolerate statins  ( states it caused memory loss) , discussed zetia and PCSK 9 inhibitors and he is still not wiling to start medication He has a history of microalbuminuria - he was on low dose losartan 12.5 mg but stopped on his own, last urine micro negative. He has Dexcom 24 hour glucose monitor but never started using it, explained easy instructions on the app. His LDL is not at goal, bp was elevated when he first come in but improved with rest   OSA: he is using CPAP machine, wakes up feeling rested    Obesity: he is on a low carb diet, his weight has been stable  Thrombocytopenia: reviewed previous labs with him, it has been hovering around 140 for years, we will continue to monitor for now   Patient Active Problem List   Diagnosis Date Noted   Thrombocytopenia (HCC) 06/06/2021   Statin intolerance 01/11/2021   Overweight (BMI 25.0-29.9) 07/26/2020   Type 2 diabetes mellitus with hyperglycemia, without long-term current use of insulin (HCC) 03/10/2019   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   Vitiligo 09/09/2018   OSA on CPAP 05/29/2017   Low HDL (under 40) 11/08/2016   Spondylolysis of cervical region 08/08/2016   Family history of colonic polyps 03/14/2015   Gastroesophageal reflux disease with esophagitis without hemorrhage 03/14/2015   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   Hypotestosteronism 03/14/2015   Family history of malignant neoplasm of prostate 05/21/2008   Hyperlipidemia LDL goal <70 01/29/2007    Past Surgical History:  Procedure Laterality Date   ANAL FISSURE REPAIR     tendinitis Left    TRIGGER FINGER RELEASE      vascectomy  08/2014    Family History  Problem Relation Age of Onset   Diabetes Father    Pancreatic cancer Father    Diabetes Brother    Diabetes Maternal Grandmother    Breast cancer Mother    Colon polyps Mother     Social History   Tobacco Use   Smoking status: Never   Smokeless tobacco: Former    Types: Chew    Quit date: 10/01/1988  Substance Use Topics   Alcohol use: No    Alcohol/week: 0.0 standard drinks     Current Outpatient Medications:    Boron 3 MG CAPS, Take 1 capsule by mouth 3 (three) times daily., Disp: , Rfl:    Chromium 200 MCG CAPS, Take 200 mcg by mouth 2 (two) times daily. , Disp: , Rfl:    Cinnamon 500 MG TABS, Take 1,000 tablets by mouth 2 (two) times daily., Disp: , Rfl:    Cod Liver Oil 5000-500 UNIT/5ML OIL, Take by mouth., Disp: , Rfl:    Coenzyme Q10 (CO Q10) 200 MG CAPS, Take 200 mg by mouth daily., Disp: , Rfl:    Continuous Blood Gluc Sensor (DEXCOM G6 SENSOR) MISC, 1 each by Does not apply route as directed. Every 10 days, Disp: 3 each, Rfl: 5   Continuous Blood Gluc Transmit (DEXCOM G6 TRANSMITTER) MISC, 1 each by Does not apply route as directed.,  Disp: 1 each, Rfl: 5   EPINEPHrine 0.3 mg/0.3 mL IJ SOAJ injection, epinephrine 0.3 mg/0.3 mL injection, auto-injector, Disp: , Rfl:    fluticasone (FLONASE) 50 MCG/ACT nasal spray, 2 sprays in each nostril Once a day Nasally 30 days, Disp: , Rfl:    levocetirizine (XYZAL) 5 MG tablet, Take 5 mg by mouth every evening. , Disp: , Rfl:    metFORMIN (GLUCOPHAGE XR) 750 MG 24 hr tablet, Take 2 tablets (1,500 mg total) by mouth daily with breakfast., Disp: 180 tablet, Rfl: 1  Allergies  Allergen Reactions   Benicar [Olmesartan] Other (See Comments)   Lisinopril Other (See Comments)    I personally reviewed active problem list, medication list, allergies, family history, social history, health maintenance with the patient/caregiver today.   ROS  Constitutional: Negative for fever or weight  change.  Respiratory: Negative for cough and shortness of breath.   Cardiovascular: Negative for chest pain or palpitations.  Gastrointestinal: Negative for abdominal pain, no bowel changes.  Musculoskeletal: Negative for gait problem or joint swelling.  Skin: Negative for rash.  Neurological: Negative for dizziness or headache.  No other specific complaints in a complete review of systems (except as listed in HPI above).   Objective  Vitals:   06/06/21 1454 06/06/21 1526  BP: 140/86 138/80  Pulse: 67   Resp: 16   Temp: 98.5 F (36.9 C)   SpO2: 99%   Weight: 236 lb (107 kg)   Height: 6' (1.829 m)     Body mass index is 32.01 kg/m.  Physical Exam  Constitutional: Patient appears well-developed and well-nourished. Obese  No distress.  HEENT: head atraumatic, normocephalic, pupils equal and reactive to light, neck supple Cardiovascular: Normal rate, regular rhythm and normal heart sounds.  No murmur heard. No BLE edema. Pulmonary/Chest: Effort normal and breath sounds normal. No respiratory distress. Abdominal: Soft.  There is no tenderness. Psychiatric: Patient has a normal mood and affect. behavior is normal. Judgment and thought content normal.   Recent Results (from the past 2160 hour(s))  POCT HgB A1C     Status: Abnormal   Collection Time: 06/06/21  3:03 PM  Result Value Ref Range   Hemoglobin A1C 6.9 (A) 4.0 - 5.6 %   HbA1c POC (<> result, manual entry)     HbA1c, POC (prediabetic range)     HbA1c, POC (controlled diabetic range)        PHQ2/9: Depression screen [facility] 2/9 06/06/2021 01/11/2021 12/14/2019 09/17/2019 09/10/2019  Decreased Interest 0 0 0 0 0  Down, Depressed, Hopeless 0 0 0 0 0  PHQ - 2 Score 0 0 0 0 0  Altered sleeping - - 0 0 0  Tired, decreased energy - - 0 0 1  Change in appetite - - 0 0 0  Feeling bad or failure about yourself  - - 0 0 0  Trouble concentrating - - 0 0 0  Moving slowly or fidgety/restless - - 0 0 0  Suicidal thoughts - - 0 0 0   PHQ-9 Score - - 0 0 1  Difficult doing work/chores - - Not difficult at all Not difficult at all Not difficult at all  Some recent data might be hidden    phq 9 is negative   Fall Risk: Fall Risk  06/06/2021 01/11/2021 07/26/2020 12/14/2019 09/17/2019  Falls in the past year? 0 0 0 0 0  Number falls in past yr: 0 0 0 0 0  Injury with Fall? 0 0 0 0  0  Risk for fall due to : No Fall Risks - - - -  Follow up Falls prevention discussed - - - Falls evaluation completed     Functional Status Survey: Is the patient deaf or have difficulty hearing?: No Does the patient have difficulty seeing, even when wearing glasses/contacts?: No Does the patient have difficulty concentrating, remembering, or making decisions?: Yes Does the patient have difficulty walking or climbing stairs?: No Does the patient have difficulty dressing or bathing?: No Does the patient have difficulty doing errands alone such as visiting a doctor's office or shopping?: No    Assessment & Plan  1. Diabetes mellitus type 2 in obese (HCC)  - POCT HgB A1C  2. Need for immunization against influenza  - Flu Vaccine QUAD 72mo+IM (Fluarix, Fluzone & Alfiuria Quad PF)  3. Need for shingles vaccine  - Varicella-zoster vaccine IM  4. Benign essential HTN  Bp borderline, he does not want to resume medications at this time   5. Statin intolerance   6. OSA on CPAP  Continue CPAP  7. Gastroesophageal reflux disease with esophagitis without hemorrhage  Controlled   8. Dyslipidemia associated with type 2 diabetes mellitus (HCC)   9. Thrombocytopenia (HCC)  We will continue to monitor

## 2021-07-24 LAB — HM DIABETES EYE EXAM

## 2021-08-02 ENCOUNTER — Ambulatory Visit: Payer: BC Managed Care – PPO | Admitting: Primary Care

## 2021-09-20 ENCOUNTER — Other Ambulatory Visit: Payer: Self-pay | Admitting: Family Medicine

## 2021-09-20 DIAGNOSIS — E1165 Type 2 diabetes mellitus with hyperglycemia: Secondary | ICD-10-CM

## 2021-11-15 ENCOUNTER — Other Ambulatory Visit: Payer: Self-pay

## 2021-11-15 ENCOUNTER — Ambulatory Visit (INDEPENDENT_AMBULATORY_CARE_PROVIDER_SITE_OTHER): Payer: BC Managed Care – PPO

## 2021-11-15 ENCOUNTER — Encounter: Payer: Self-pay | Admitting: Podiatry

## 2021-11-15 ENCOUNTER — Ambulatory Visit: Payer: BC Managed Care – PPO | Admitting: Podiatry

## 2021-11-15 DIAGNOSIS — M778 Other enthesopathies, not elsewhere classified: Secondary | ICD-10-CM

## 2021-11-15 DIAGNOSIS — S86312A Strain of muscle(s) and tendon(s) of peroneal muscle group at lower leg level, left leg, initial encounter: Secondary | ICD-10-CM

## 2021-11-15 DIAGNOSIS — S99922A Unspecified injury of left foot, initial encounter: Secondary | ICD-10-CM | POA: Diagnosis not present

## 2021-11-15 NOTE — Progress Notes (Signed)
He presents today chief concern of pain to his left foot.  States that is really just not improving as he refers to the second toe and the second metatarsal phalangeal joint.  He also states that he has pain along the posterior tibial peroneal tendon repair previously states that it seems to be intermittent but does swell occasionally.  He states that he tries to wear his cam boot when it swells and starts to act up.  He also takes anti-inflammatories.  Objective: Vital signs are stable alert and oriented x3.  Pulses are palpable.  His right foot demonstrates some tenderness in the midfoot on frontal plane range of motion however the left foot does demonstrate some mild edema overlying the surgical site peroneal tendons left but primarily exquisitely painful with a cocked up dislocated hammertoe deformity and most likely a rupture or tear of the plantar plate of the second toe left foot.  Assessment: Probable tear of the plantar plate second metatarsal phalangeal joint left with severe hammertoe deformity.  Also peroneal tendinitis and previously repaired peroneal tendon.  Plan: Discussed etiology pathology and surgical therapies I am requesting an MRI of the rearfoot for surveillance of the peroneal tendon repair also requesting MRI of the forefoot to rule out a rupture or tear of the plantar plate for differential diagnosis and surgical planning.

## 2021-12-04 NOTE — Progress Notes (Signed)
Name: Ryan Ritter   MRN: [id number]    DOB: 1966-06-17   Date:12/05/2021       Progress Note  Subjective  Chief Complaint  Follow Up  HPI  DMII: he has associated dyslipidemia,and obesity. A1C went down from 7.1 % to 6.9 %  he is taking metformin 1500 mg as prescribed, but based on his glucose today his A1C will be high. He discontinue Vascepa about one year ago and is eating fish and tree nuts. Marland Kitchen He cannot tolerate statins  ( states it caused memory loss) , discussed zetia and PCSK 9 inhibitors and he is still not wiling to start medication He has a history of microalbuminuria - he was on low dose losartan 12.5 mg but stopped on his own, last urine micro negative. He has Dexcom 24 hour glucose monitor but he just started to use it 4 days ago and glucose has been high. He is on a keto diet, discussed his elevated fasting glucose and starting new medications, he would like to try changing diet more and start eating breakfast and return in about 6 weeks to see how he is doing   OSA: he is using CPAP machine, wakes up feeling rested. Unchanged   Obesity: he is on a low carb diet, his weight has been stable, gained about 4 lbs in the past 6 months   Thrombocytopenia: reviewed previous labs with him, it has been hovering around 140 for years, we will recheck today. Paternal grandfather had Multiple Myeloma and Maternal grandfather had leukemia.   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   Hypotestosteronism 03/14/2015   Family history of malignant neoplasm of prostate 05/21/2008   Hyperlipidemia LDL goal <70 01/29/2007    Past Surgical History:  Procedure Laterality Date   ANAL FISSURE REPAIR     tendinitis Left    TRIGGER FINGER RELEASE     vascectomy  08/2014    Family History  Problem Relation Age of Onset   Breast cancer Mother    Colon polyps Mother    Diabetes Father    Pancreatic cancer Father    Diabetes Brother    Diabetes Maternal Grandmother    Cancer Maternal Grandfather        leukemia   Cancer Paternal Grandfather        multiple myeloma    Social History   Tobacco Use   Smoking status: Never   Smokeless tobacco: Former    Types: Chew    Quit date: 10/01/1988  Substance Use Topics   Alcohol use: No    Alcohol/week: 0.0 standard drinks     Current Outpatient Medications:    Boron 3 MG CAPS, Take 1 capsule by mouth 3 (three) times daily., Disp: , Rfl:    Chromium 200 MCG CAPS, Take 200 mcg by mouth 2 (two) times daily. , Disp: , Rfl:    Cinnamon 500 MG TABS, Take 1,000 tablets by mouth 2 (two) times daily., Disp: , Rfl:  Cod Liver Oil 5000-500 UNIT/5ML OIL, Take by mouth., Disp: , Rfl:    Coenzyme Q10 (CO Q10) 200 MG CAPS, Take 200 mg by mouth daily., Disp: , Rfl:    Continuous Blood Gluc Sensor (DEXCOM G6 SENSOR) MISC, 1 each by Does not apply route as directed. Every 10 days, Disp: 3 each, Rfl: 5   Continuous Blood Gluc Transmit (DEXCOM G6 TRANSMITTER) MISC, 1 each by Does not apply route as directed., Disp: 1 each, Rfl: 5   EPINEPHrine 0.3 mg/0.3 mL IJ SOAJ injection, epinephrine 0.3 mg/0.3 mL injection, auto-injector, Disp: , Rfl:    fluticasone (FLONASE) 50 MCG/ACT nasal spray, 2 sprays in each nostril Once a day Nasally 30 days, Disp: , Rfl:    levocetirizine (XYZAL) 5 MG tablet, Take 5 mg by mouth every evening. , Disp: , Rfl:    metFORMIN (GLUCOPHAGE-XR) 750 MG 24 hr tablet, Take 2 tablets (1,500  mg total) by mouth daily with breakfast., Disp: 180 tablet, Rfl: 0  Allergies  Allergen Reactions   Benicar [Olmesartan] Other (See Comments)   Lisinopril Other (See Comments)    I personally reviewed active problem list, medication list, allergies, family history, social history with the patient/caregiver today.   ROS  Constitutional: Negative for fever or weight change.  Respiratory: Negative for cough and shortness of breath.   Cardiovascular: Negative for chest pain or palpitations.  Gastrointestinal: Negative for abdominal pain, no bowel changes.  Musculoskeletal: Negative for gait problem or joint swelling.  Skin: Negative for rash.  Neurological: Negative for dizziness or headache.  No other specific complaints in a complete review of systems (except as listed in HPI above).   Objective  Vitals:   12/05/21 0743  BP: 132/70  Pulse: 69  Resp: 16  SpO2: 97%  Weight: 240 lb (108.9 kg)  Height: 6' (1.829 m)    Body mass index is 32.55 kg/m.  Physical Exam  Constitutional: Patient appears well-developed and well-nourished. Obese  No distress.  HEENT: head atraumatic, normocephalic, pupils equal and reactive to light, neck supple Cardiovascular: Normal rate, regular rhythm and normal heart sounds.  No murmur heard. No BLE edema. Pulmonary/Chest: Effort normal and breath sounds normal. No respiratory distress. Abdominal: Soft.  There is no tenderness. Psychiatric: Patient has a normal mood and affect. behavior is normal. Judgment and thought content normal.   Diabetic Foot Exam: Diabetic Foot Exam - Simple   Simple Foot Form Diabetic Foot exam was performed with the following findings: Yes 12/05/2021  8:14 AM  Visual Inspection No deformities, no ulcerations, no other skin breakdown bilaterally: Yes Sensation Testing Intact to touch and monofilament testing bilaterally: Yes Pulse Check Posterior Tibialis and Dorsalis pulse intact bilaterally: Yes Comments       PHQ2/9: Depression screen [facility] 2/9 12/05/2021 06/06/2021 01/11/2021 12/14/2019 09/17/2019  Decreased Interest 0 0 0 0 0  Down, Depressed, Hopeless 0 0 0 0 0  PHQ - 2 Score 0 0 0 0 0  Altered sleeping 0 - - 0 0  Tired, decreased energy 0 - - 0 0  Change in appetite 0 - - 0 0  Feeling bad or failure about yourself  0 - - 0 0  Trouble concentrating 0 - - 0 0  Moving slowly or fidgety/restless 0 - - 0 0  Suicidal thoughts 0 - - 0 0  PHQ-9 Score 0 - - 0 0  Difficult doing work/chores - - - Not difficult at all Not difficult at all  Some recent data might  be hidden    phq 9 is negative   Fall Risk: Fall Risk  12/05/2021 06/06/2021 01/11/2021 07/26/2020 12/14/2019  Falls in the past year? 0 0 0 0 0  Number falls in past yr: 0 0 0 0 0  Injury with Fall? 0 0 0 0 0  Risk for fall due to : No Fall Risks No Fall Risks - - -  Follow up Falls prevention discussed Falls prevention discussed - - -      Functional Status Survey: Is the patient deaf or have difficulty hearing?: No Does the patient have difficulty seeing, even when wearing glasses/contacts?: No Does the patient have difficulty concentrating, remembering, or making decisions?: Yes Does the patient have difficulty walking or climbing stairs?: No Does the patient have difficulty dressing or bathing?: No Does the patient have difficulty doing errands alone such as visiting a doctor's office or shopping?: No    Assessment & Plan  1. Diabetes mellitus type 2 in obese (HCC)  - HgB A1c - Microalbumin / creatinine urine ratio - HM Diabetes Foot Exam - POCT Glucose (CBG)  2. Benign essential HTN  - COMPLETE METABOLIC PANEL WITH GFR  3. Thrombocytopenia (HCC)  - CBC with Differential/Platelet  4. Dyslipidemia associated with type 2 diabetes mellitus (HCC)  - HgB A1c - Microalbumin / creatinine urine ratio - HM Diabetes Foot Exam - Lipid panel  5. OSA on CPAP   6. Gastroesophageal reflux disease with esophagitis  without hemorrhage   7. Statin intolerance   8. Type 2 diabetes mellitus with hyperglycemia, without long-term current use of insulin (HCC)  - metFORMIN (GLUCOPHAGE-XR) 750 MG 24 hr tablet; Take 2 tablets (1,500 mg total) by mouth daily with breakfast.  Dispense: 180 tablet; Refill: 0  9. Hyperglycemia  - Glutamic acid decarboxylase auto abs - Insulin, Free (Bioactive) - C-peptide - Anti-islet cell antibody  10. Long-term use of high-risk medication  - Vitamin B12

## 2021-12-05 ENCOUNTER — Ambulatory Visit: Payer: BC Managed Care – PPO | Admitting: Family Medicine

## 2021-12-05 ENCOUNTER — Encounter: Payer: Self-pay | Admitting: Family Medicine

## 2021-12-05 ENCOUNTER — Other Ambulatory Visit: Payer: Self-pay

## 2021-12-05 VITALS — BP 132/70 | HR 69 | Resp 16 | Ht 72.0 in | Wt 240.0 lb

## 2021-12-05 DIAGNOSIS — K2100 Gastro-esophageal reflux disease with esophagitis, without bleeding: Secondary | ICD-10-CM

## 2021-12-05 DIAGNOSIS — G4733 Obstructive sleep apnea (adult) (pediatric): Secondary | ICD-10-CM | POA: Diagnosis not present

## 2021-12-05 DIAGNOSIS — I10 Essential (primary) hypertension: Secondary | ICD-10-CM | POA: Diagnosis not present

## 2021-12-05 DIAGNOSIS — E785 Hyperlipidemia, unspecified: Secondary | ICD-10-CM

## 2021-12-05 DIAGNOSIS — E669 Obesity, unspecified: Secondary | ICD-10-CM

## 2021-12-05 DIAGNOSIS — E1165 Type 2 diabetes mellitus with hyperglycemia: Secondary | ICD-10-CM

## 2021-12-05 DIAGNOSIS — Z79899 Other long term (current) drug therapy: Secondary | ICD-10-CM

## 2021-12-05 DIAGNOSIS — D696 Thrombocytopenia, unspecified: Secondary | ICD-10-CM | POA: Diagnosis not present

## 2021-12-05 DIAGNOSIS — E1169 Type 2 diabetes mellitus with other specified complication: Secondary | ICD-10-CM | POA: Diagnosis not present

## 2021-12-05 DIAGNOSIS — Z9989 Dependence on other enabling machines and devices: Secondary | ICD-10-CM

## 2021-12-05 DIAGNOSIS — R739 Hyperglycemia, unspecified: Secondary | ICD-10-CM

## 2021-12-05 DIAGNOSIS — Z789 Other specified health status: Secondary | ICD-10-CM

## 2021-12-05 LAB — GLUCOSE, POCT (MANUAL RESULT ENTRY): POC Glucose: 328 mg/dl — AB (ref 70–99)

## 2021-12-05 MED ORDER — METFORMIN HCL ER 750 MG PO TB24: 1500.0000 mg | ORAL_TABLET | Freq: Every day | ORAL | 0 refills | Status: DC

## 2021-12-06 ENCOUNTER — Encounter: Payer: Self-pay | Admitting: Family Medicine

## 2021-12-11 NOTE — Patient Instructions (Incomplete)

## 2021-12-11 NOTE — Progress Notes (Unsigned)
Name: Ryan Ritter   MRN: [id number]    DOB: 05-28-1966   Date:12/11/2021       Progress Note  Subjective  Chief Complaint  Follow Up  HPI  Patient presents for annual CPE ***.  IPSS Questionnaire (AUA-7): Over the past month   1)  How often have you had a sensation of not emptying your bladder completely after you finish urinating?  {Rating:19227}  2)  How often have you had to urinate again less than two hours after you finished urinating? {Rating:19227}  3)  How often have you found you stopped and started again several times when you urinated?  {Rating:19227}  4) How difficult have you found it to postpone urination?  {Rating:19227}  5) How often have you had a weak urinary stream?  {Rating:19227}  6) How often have you had to push or strain to begin urination?  {Rating:19227}  7) How many times did you most typically get up to urinate from the time you went to bed until the time you got up in the morning?  {Rating:19228}  Total score:  0-7 mildly symptomatic   8-19 moderately symptomatic   20-35 severely symptomatic     Diet: *** Exercise: ***  Depression: phq 9 is {gen pos PJS:315945} Depression screen [facility] 2/9 12/05/2021 06/06/2021 01/11/2021 12/14/2019 09/17/2019  Decreased Interest 0 0 0 0 0  Down, Depressed, Hopeless 0 0 0 0 0  PHQ - 2 Score 0 0 0 0 0  Altered sleeping 0 - - 0 0  Tired, decreased energy 0 - - 0 0  Change in appetite 0 - - 0 0  Feeling bad or failure about yourself  0 - - 0 0  Trouble concentrating 0 - - 0 0  Moving slowly or fidgety/restless 0 - - 0 0  Suicidal thoughts 0 - - 0 0  PHQ-9 Score 0 - - 0 0  Difficult doing work/chores - - - Not difficult at all Not difficult at all  Some recent data might be hidden    Hypertension:  BP Readings from Last 3 Encounters:  12/05/21 132/70  06/06/21 138/80  01/11/21 132/70    Obesity: Wt Readings from Last 3 Encounters:  12/05/21 240 lb (108.9 kg)  06/06/21 236 lb (107 kg)  01/11/21 236 lb (107 kg)    BMI Readings from Last 3 Encounters:  12/05/21 32.55 kg/m  06/06/21 32.01 kg/m  01/11/21 32.01 kg/m     Lipids:  Lab Results  Component Value Date   CHOL 207 (H) 12/05/2021   CHOL 172 01/12/2021   CHOL 204 (H) 12/14/2019   Lab Results  Component Value Date   HDL 30 (L) 12/05/2021   HDL 34 (L) 01/12/2021   HDL 40 12/14/2019   Lab Results  Component Value Date   LDLCALC 140 (H) 12/05/2021   LDLCALC 116 (H) 01/12/2021   LDLCALC 138 (H) 12/14/2019   Lab Results  Component Value Date   TRIG 231 (H) 12/05/2021   TRIG 116 01/12/2021   TRIG 135 12/14/2019   Lab Results  Component Value Date   CHOLHDL 6.9 (H) 12/05/2021   CHOLHDL 5.1 (H) 01/12/2021   CHOLHDL 5.1 (H) 12/14/2019   No results found for: LDLDIRECT Glucose:  Glucose, Bld  Date Value Ref Range Status  12/05/2021 334 (H) 65 - 99 mg/dL Final    Comment:    .            Fasting reference interval . For someone without  known diabetes, a glucose value >125 mg/dL indicates that they may have diabetes and this should be confirmed with a follow-up test. .   01/12/2021 192 (H) 65 - 99 mg/dL Final    Comment:    .            Fasting reference interval . For someone without known diabetes, a glucose value >125 mg/dL indicates that they may have diabetes and this should be confirmed with a follow-up test. .   07/26/2020 170 (H) 65 - 99 mg/dL Final    Comment:    .            Fasting reference interval . For someone without known diabetes, a glucose value >125 mg/dL indicates that they may have diabetes and this should be confirmed with a follow-up test. .     [place] Office Visit from 12/14/2019 in [facility]  AUDIT-C Score 0       Married STD testing and prevention (HIV/chl/gon/syphilis):  09/02/18 Hep C Screening: 01/12/21 Skin cancer: Discussed monitoring for atypical lesions Colorectal cancer: 06/05/19 Prostate cancer:   Lab Results  Component Value Date    PSA 0.5 09/10/2019   PSA 0.4 02/25/2018     Lung cancer:  Low Dose CT Chest recommended if Age 68-80 years, 30 pack-year currently smoking OR have quit w/in 15years. Patient  no AAA: The USPSTF recommends one-time screening with ultrasonography in men ages 6 to 72 years who have ever smoked. Patient:  no ECG:  09/01/04  Vaccines:   HPV: N/A Tdap: up to date Shingrix: up to date Pneumonia: up to date Flu: up to date COVID-50: up to date  Advanced Care Planning: A voluntary discussion about advance care planning including the explanation and discussion of advance directives.  Discussed health care proxy and Living will, and the patient was able to identify a health care proxy as ***.  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   Hypotestosteronism 03/14/2015   Family history of malignant neoplasm of prostate 05/21/2008   Hyperlipidemia LDL goal <70 01/29/2007    Past Surgical History:  Procedure Laterality Date   ANAL FISSURE REPAIR     tendinitis Left    TRIGGER FINGER RELEASE     vascectomy  08/2014    Family History  Problem Relation Age of Onset   Breast cancer Mother    Colon polyps Mother    Diabetes Father    Pancreatic cancer Father    Diabetes Brother    Diabetes Maternal Grandmother    Cancer Maternal Grandfather        leukemia   Cancer Paternal Grandfather        multiple myeloma    Social History    Socioeconomic History   Marital status: Married    Spouse name: Not on file   Number of children: Not on file   Years of education: Not on file   Highest education level: Not  on file  Occupational History   Not on file  Tobacco Use   Smoking status: Never   Smokeless tobacco: Former    Types: Chew    Quit date: 10/01/1988  Vaping Use   Vaping Use: Never used  Substance and Sexual Activity   Alcohol use: No    Alcohol/week: 0.0 standard drinks   Drug use: No   Sexual activity: Yes    Partners: Female  Other Topics Concern   Not on file  Social History Narrative   Not on file   Social Determinants of Health   Financial Resource Strain: Not on file  Food Insecurity: Not on file  Transportation Needs: Not on file  Physical Activity: Not on file  Stress: Not on file  Social Connections: Not on file  Intimate Partner Violence: Not on file     Current Outpatient Medications:    Boron 3 MG CAPS, Take 1 capsule by mouth 3 (three) times daily., Disp: , Rfl:    Chromium 200 MCG CAPS, Take 200 mcg by mouth 2 (two) times daily. , Disp: , Rfl:    Cinnamon 500 MG TABS, Take 1,000 tablets by mouth 2 (two) times daily., Disp: , Rfl:    Cod Liver Oil 5000-500 UNIT/5ML OIL, Take by mouth., Disp: , Rfl:    Coenzyme Q10 (CO Q10) 200 MG CAPS, Take 200 mg by mouth daily., Disp: , Rfl:    Continuous Blood Gluc Sensor (DEXCOM G6 SENSOR) MISC, 1 each by Does not apply route as directed. Every 10 days, Disp: 3 each, Rfl: 5   Continuous Blood Gluc Transmit (DEXCOM G6 TRANSMITTER) MISC, 1 each by Does not apply route as directed., Disp: 1 each, Rfl: 5   EPINEPHrine 0.3 mg/0.3 mL IJ SOAJ injection, epinephrine 0.3 mg/0.3 mL injection, auto-injector, Disp: , Rfl:    fluticasone (FLONASE) 50 MCG/ACT nasal spray, 2 sprays in each nostril Once a day Nasally 30 days, Disp: , Rfl:    levocetirizine (XYZAL) 5 MG tablet, Take 5 mg by mouth every evening. , Disp: , Rfl:    metFORMIN (GLUCOPHAGE-XR) 750  MG 24 hr tablet, Take 2 tablets (1,500 mg total) by mouth daily with breakfast., Disp: 180 tablet, Rfl: 0  Allergies  Allergen Reactions   Benicar [Olmesartan] Other (See Comments)   Lisinopril Other (See Comments)     ROS  ***   Objective  There were no vitals filed for this visit.  There is no height or weight on file to calculate BMI.  Physical Exam ***  Recent Results (from the past 2160 hour(s))  POCT Glucose (CBG)     Status: Abnormal   Collection Time: 12/05/21  8:01 AM  Result Value Ref Range   POC Glucose 328 (A) 70 - 99 mg/dl  HgB A1c     Status: Abnormal   Collection Time: 12/05/21  8:30 AM  Result Value Ref Range   Hgb A1c MFr Bld 9.1 (H) <5.7 % of total Hgb    Comment: For someone without known diabetes, a hemoglobin A1c value of 6.5% or greater indicates that they may have  diabetes and this should be confirmed with a follow-up  test. . For someone with known diabetes, a value <7% indicates  that their diabetes is well controlled and a value  greater than or equal to 7% indicates suboptimal  control. A1c targets should be individualized based on  duration of diabetes, age, comorbid conditions, and  other considerations. . Currently, no consensus exists regarding use  of hemoglobin A1c for diagnosis of diabetes for children. .    Mean Plasma Glucose 214 mg/dL   eAG (mmol/L) 11.9 mmol/L  Microalbumin / creatinine urine ratio     Status: None   Collection Time: 12/05/21  8:30 AM  Result Value Ref Range   Creatinine, Urine 94 20 - 320 mg/dL   Microalb, Ur 0.6 mg/dL    Comment: Reference Range Not established    Microalb Creat Ratio 6 <30 mcg/mg creat    Comment: . The ADA defines abnormalities in albumin excretion as follows: Marland Kitchen Albuminuria Category        Result (mcg/mg creatinine) . Normal to Mildly increased   <30 Moderately increased         30-299  Severely increased           > OR = 300 . The ADA recommends that at least two of  three specimens collected within a 3-6 month period be abnormal before considering a patient to be within a diagnostic category.   Lipid panel     Status: Abnormal   Collection Time: 12/05/21  8:30 AM  Result Value Ref Range   Cholesterol 207 (H) <200 mg/dL   HDL 30 (L) > OR = 40 mg/dL   Triglycerides 231 (H) <150 mg/dL    Comment: . If a non-fasting specimen was collected, consider repeat triglyceride testing on a fasting specimen if clinically indicated.  Yates Decamp et al. J. of Clin. Lipidol. 8372;9:021-115. Marland Kitchen    LDL Cholesterol (Calc) 140 (H) mg/dL (calc)    Comment: Reference range: <100 . Desirable range <100 mg/dL for primary prevention;   <70 mg/dL for patients with CHD or diabetic patients  with > or = 2 CHD risk factors. Marland Kitchen LDL-C is now calculated using the Martin-Hopkins  calculation, which is a validated novel method providing  better accuracy than the Friedewald equation in the  estimation of LDL-C.  Cresenciano Genre et al. Annamaria Helling. 5208;022(33): 2061-2068  (http://education.[facility].com/faq/FAQ164)    Total CHOL/HDL Ratio 6.9 (H) <5.0 (calc)   Non-HDL Cholesterol (Calc) 177 (H) <130 mg/dL (calc)    Comment: For patients with diabetes plus 1 major ASCVD risk  factor, treating to a non-HDL-C goal of <100 mg/dL  (LDL-C of <70 mg/dL) is considered a therapeutic  option.   COMPLETE METABOLIC PANEL WITH GFR     Status: Abnormal   Collection Time: 12/05/21  8:30 AM  Result Value Ref Range   Glucose, Bld 334 (H) 65 - 99 mg/dL    Comment: .            Fasting reference interval . For someone without known diabetes, a glucose value >125 mg/dL indicates that they may have diabetes and this should be confirmed with a follow-up test. .    BUN 19 7 - 25 mg/dL   Creat 0.86 0.70 - 1.30 mg/dL   eGFR 102 > OR = 60 mL/min/1.61m    Comment: The eGFR is based on the CKD-EPI 2021 equation. To calculate  the new eGFR from a previous Creatinine or Cystatin C result, go to  https://www.kidney.org/professionals/ kdoqi/gfr%5Fcalculator    BUN/Creatinine Ratio NOT APPLICABLE 6 - 22 (calc)   Sodium 135 135 - 146 mmol/L   Potassium 4.6 3.5 - 5.3 mmol/L   Chloride 102 98 - 110 mmol/L   CO2 24 20 - 32 mmol/L   Calcium 8.9 8.6 - 10.3 mg/dL   Total Protein 6.5 6.1 - 8.1 g/dL   Albumin 4.6 3.6 - 5.1  g/dL   Globulin 1.9 1.9 - 3.7 g/dL (calc)   AG Ratio 2.4 1.0 - 2.5 (calc)   Total Bilirubin 0.6 0.2 - 1.2 mg/dL   Alkaline phosphatase (APISO) 40 35 - 144 U/L   AST 15 10 - 35 U/L   ALT 23 9 - 46 U/L  CBC with Differential/Platelet     Status: Abnormal   Collection Time: 12/05/21  8:30 AM  Result Value Ref Range   WBC 4.0 3.8 - 10.8 Thousand/uL   RBC 4.83 4.20 - 5.80 Million/uL   Hemoglobin 14.7 13.2 - 17.1 g/dL   HCT 43.8 38.5 - 50.0 %   MCV 90.7 80.0 - 100.0 fL   MCH 30.4 27.0 - 33.0 pg   MCHC 33.6 32.0 - 36.0 g/dL   RDW 12.6 11.0 - 15.0 %   Platelets 131 (L) 140 - 400 Thousand/uL   MPV 11.0 7.5 - 12.5 fL   Neutro Abs 2,588 1,500 - 7,800 cells/uL   Lymphs Abs 756 (L) 850 - 3,900 cells/uL   Absolute Monocytes 384 200 - 950 cells/uL   Eosinophils Absolute 240 15 - 500 cells/uL   Basophils Absolute 32 0 - 200 cells/uL   Neutrophils Relative % 64.7 %   Total Lymphocyte 18.9 %   Monocytes Relative 9.6 %   Eosinophils Relative 6.0 %   Basophils Relative 0.8 %  C-peptide     Status: None   Collection Time: 12/05/21  8:30 AM  Result Value Ref Range   C-Peptide 2.08 0.80 - 3.85 ng/mL  Vitamin B12     Status: None   Collection Time: 12/05/21  8:30 AM  Result Value Ref Range   Vitamin B-12 522 200 - 1,100 pg/mL     Fall Risk: Fall Risk  12/05/2021 06/06/2021 01/11/2021 07/26/2020 12/14/2019  Falls in the past year? 0 0 0 0 0  Number falls in past yr: 0 0 0 0 0  Injury with Fall? 0 0 0 0 0  Risk for fall due to : No Fall Risks No Fall Risks - - -  Follow up Falls prevention discussed Falls prevention discussed - - -     Functional Status Survey:       Assessment & Plan  1. Well adult exam ***    -Prostate cancer screening and PSA options (with potential risks and benefits of testing vs not testing) were discussed along with recent recs/guidelines. -USPSTF grade A and B recommendations reviewed with patient; age-appropriate recommendations, preventive care, screening tests, etc discussed and encouraged; healthy living encouraged; see AVS for patient education given to patient -Discussed importance of 150 minutes of physical activity weekly, eat two servings of fish weekly, eat one serving of tree nuts ( cashews, pistachios, pecans, almonds.Marland Kitchen) every other day, eat 6 servings of fruit/vegetables daily and drink plenty of water and avoid sweet beverages.  -Reviewed Health Maintenance: {yes/no/default A/L:93790::"WIO applicable"}

## 2021-12-12 ENCOUNTER — Encounter: Payer: Self-pay | Admitting: Family Medicine

## 2021-12-12 ENCOUNTER — Ambulatory Visit (INDEPENDENT_AMBULATORY_CARE_PROVIDER_SITE_OTHER): Payer: BC Managed Care – PPO | Admitting: Family Medicine

## 2021-12-12 VITALS — BP 136/70 | HR 82 | Resp 16 | Ht 72.0 in | Wt 243.0 lb

## 2021-12-12 DIAGNOSIS — E785 Hyperlipidemia, unspecified: Secondary | ICD-10-CM

## 2021-12-12 DIAGNOSIS — E1169 Type 2 diabetes mellitus with other specified complication: Secondary | ICD-10-CM | POA: Diagnosis not present

## 2021-12-12 DIAGNOSIS — Z0000 Encounter for general adult medical examination without abnormal findings: Secondary | ICD-10-CM | POA: Diagnosis not present

## 2021-12-12 LAB — GLUCOSE, POCT (MANUAL RESULT ENTRY): POC Glucose: 202 mg/dl — AB (ref 70–99)

## 2021-12-12 MED ORDER — INSULIN DEGLUDEC 100 UNIT/ML ~~LOC~~ SOPN
16.0000 [IU] | PEN_INJECTOR | Freq: Every day | SUBCUTANEOUS | 0 refills | Status: DC
Start: 2021-12-12 — End: 2022-05-28

## 2021-12-12 MED ORDER — DEXCOM G7 SENSOR MISC: 1.0000 | 1 refills | Status: AC

## 2021-12-12 MED ORDER — BLOOD GLUCOSE METER KIT: 1.0000 | PACK | Freq: Every day | 0 refills | Status: DC

## 2021-12-15 ENCOUNTER — Ambulatory Visit
Admission: RE | Admit: 2021-12-15 | Discharge: 2021-12-15 | Disposition: A | Discharge status: Home / Self Care | Payer: BC Managed Care – PPO | Source: Ambulatory Visit | Attending: Podiatry | Admitting: Podiatry

## 2021-12-15 DIAGNOSIS — M778 Other enthesopathies, not elsewhere classified: Secondary | ICD-10-CM

## 2021-12-15 DIAGNOSIS — S99922A Unspecified injury of left foot, initial encounter: Secondary | ICD-10-CM

## 2021-12-15 DIAGNOSIS — S86312A Strain of muscle(s) and tendon(s) of peroneal muscle group at lower leg level, left leg, initial encounter: Secondary | ICD-10-CM

## 2021-12-16 ENCOUNTER — Encounter: Payer: Self-pay | Admitting: Family Medicine

## 2021-12-18 ENCOUNTER — Other Ambulatory Visit: Payer: Self-pay | Admitting: Family Medicine

## 2021-12-18 ENCOUNTER — Other Ambulatory Visit: Payer: Self-pay

## 2021-12-18 DIAGNOSIS — E1169 Type 2 diabetes mellitus with other specified complication: Secondary | ICD-10-CM

## 2021-12-18 MED ORDER — INSULIN PEN NEEDLE 32G X 4 MM MISC: 1.0000 | Freq: Every day | 2 refills | Status: AC

## 2021-12-21 LAB — COMPLETE METABOLIC PANEL WITH GFR
AG Ratio: 2.4 (calc) (ref 1.0–2.5)
ALT: 23 U/L (ref 9–46)
AST: 15 U/L (ref 10–35)
Albumin: 4.6 g/dL (ref 3.6–5.1)
Alkaline phosphatase (APISO): 40 U/L (ref 35–144)
BUN: 19 mg/dL (ref 7–25)
CO2: 24 mmol/L (ref 20–32)
Calcium: 8.9 mg/dL (ref 8.6–10.3)
Chloride: 102 mmol/L (ref 98–110)
Creat: 0.86 mg/dL (ref 0.70–1.30)
Globulin: 1.9 g/dL (calc) (ref 1.9–3.7)
Glucose, Bld: 334 mg/dL — ABNORMAL HIGH (ref 65–99)
Potassium: 4.6 mmol/L (ref 3.5–5.3)
Sodium: 135 mmol/L (ref 135–146)
Total Bilirubin: 0.6 mg/dL (ref 0.2–1.2)
Total Protein: 6.5 g/dL (ref 6.1–8.1)
eGFR: 102 mL/min/{1.73_m2} (ref >=60)

## 2021-12-21 LAB — CBC WITH DIFFERENTIAL/PLATELET
Absolute Monocytes: 384 cells/uL (ref 200–950)
Basophils Absolute: 32 cells/uL (ref 0–200)
Basophils Relative: 0.8 %
Eosinophils Absolute: 240 cells/uL (ref 15–500)
Eosinophils Relative: 6 %
HCT: 43.8 % (ref 38.5–50.0)
Hemoglobin: 14.7 g/dL (ref 13.2–17.1)
Lymphs Abs: 756 cells/uL — ABNORMAL LOW (ref 850–3900)
MCH: 30.4 pg (ref 27.0–33.0)
MCHC: 33.6 g/dL (ref 32.0–36.0)
MCV: 90.7 fL (ref 80.0–100.0)
MPV: 11 fL (ref 7.5–12.5)
Monocytes Relative: 9.6 %
Neutro Abs: 2588 cells/uL (ref 1500–7800)
Neutrophils Relative %: 64.7 %
Platelets: 131 10*3/uL — ABNORMAL LOW (ref 140–400)
RBC: 4.83 10*6/uL (ref 4.20–5.80)
RDW: 12.6 % (ref 11.0–15.0)
Total Lymphocyte: 18.9 %
WBC: 4 10*3/uL (ref 3.8–10.8)

## 2021-12-21 LAB — ISLET CELL AB SCREEN RFLX TO TITER: ISLET CELL ANTIBODY SCREEN: NEGATIVE

## 2021-12-21 LAB — HEMOGLOBIN A1C
Hgb A1c MFr Bld: 9.1 % of total Hgb — ABNORMAL HIGH (ref <5.7)
Mean Plasma Glucose: 214 mg/dL
eAG (mmol/L): 11.9 mmol/L

## 2021-12-21 LAB — INSULIN, FREE (BIOACTIVE): Insulin, Free: 8.7 u[IU]/mL (ref 1.5–14.9)

## 2021-12-21 LAB — VITAMIN B12: Vitamin B-12: 522 pg/mL (ref 200–1100)

## 2021-12-21 LAB — LIPID PANEL
Cholesterol: 207 mg/dL — ABNORMAL HIGH (ref <200)
HDL: 30 mg/dL — ABNORMAL LOW (ref >=40)
LDL Cholesterol (Calc): 140 mg/dL (calc) — ABNORMAL HIGH
Non-HDL Cholesterol (Calc): 177 mg/dL (calc) — ABNORMAL HIGH (ref <130)
Total CHOL/HDL Ratio: 6.9 (calc) — ABNORMAL HIGH (ref <5.0)
Triglycerides: 231 mg/dL — ABNORMAL HIGH (ref <150)

## 2021-12-21 LAB — C-PEPTIDE: C-Peptide: 2.08 ng/mL (ref 0.80–3.85)

## 2021-12-21 LAB — MICROALBUMIN / CREATININE URINE RATIO
Creatinine, Urine: 94 mg/dL (ref 20–320)
Microalb Creat Ratio: 6 mcg/mg creat (ref <30)
Microalb, Ur: 0.6 mg/dL

## 2021-12-25 ENCOUNTER — Encounter: Payer: Self-pay | Admitting: Family Medicine

## 2021-12-27 ENCOUNTER — Ambulatory Visit: Payer: BC Managed Care – PPO | Admitting: Podiatry

## 2021-12-27 ENCOUNTER — Encounter: Payer: Self-pay | Admitting: Podiatry

## 2021-12-27 DIAGNOSIS — M722 Plantar fascial fibromatosis: Secondary | ICD-10-CM | POA: Diagnosis not present

## 2021-12-27 DIAGNOSIS — M2042 Other hammer toe(s) (acquired), left foot: Secondary | ICD-10-CM | POA: Diagnosis not present

## 2021-12-27 DIAGNOSIS — M21962 Unspecified acquired deformity of left lower leg: Secondary | ICD-10-CM

## 2021-12-27 NOTE — Progress Notes (Signed)
He presents today for his MRI results states that he still in pain beneath his second metatarsal phalangeal joint area.  He states that the plantar fascia is sometimes bothersome but for the most part he does not have a lot of pain along the repair site of his peroneal tendon. ? ?Objective: Vital signs are stable alert and oriented x3.  There is no erythema edema cellulitis drainage or odor hammertoe deformity second left is painful on attempted range of motion and reduction.  He has no tenderness on palpation today of the plantar fascia or the peroneal tendon site. ? ?MRI does demonstrate a joint space effusion of the second and the first metatarsal phalangeal joints of the left foot and a well-healed peroneal repair. ? 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  Minor plantar fasciitis. ? ?Plan: Discussed etiology pathology conservative versus surgical therapies.  At this point we consented him for a second metatarsal osteotomy with a hammertoe repair and K wire.  I also consented him for PRP injection to the plantar fascial site.  We did discuss the possible postop complications and the duration of healing.  We discussed the complications which may consist of but not limited to postop pain bleeding swelling infection recurrence need for further surgery overcorrection under correction also digit loss of limb loss of life. ? ?We once again discussed the surgery center the anesthesia group and the fact that he does not want a block would rather have general surgery with local anesthetic.  I will follow-up with him in a few weeks.  We provided him with all information necessary. ? ?Most likely he will be out of work for 3 months ?

## 2022-01-10 ENCOUNTER — Encounter: Payer: Self-pay | Admitting: Podiatry

## 2022-01-13 ENCOUNTER — Other Ambulatory Visit: Payer: Self-pay | Admitting: Family Medicine

## 2022-01-26 DIAGNOSIS — M79676 Pain in unspecified toe(s): Secondary | ICD-10-CM

## 2022-01-30 ENCOUNTER — Encounter: Payer: Self-pay | Admitting: Family Medicine

## 2022-01-30 ENCOUNTER — Ambulatory Visit: Payer: BC Managed Care – PPO | Admitting: Family Medicine

## 2022-01-30 VITALS — BP 130/68 | HR 63 | Temp 98.2°F | Resp 18 | Ht 72.0 in | Wt 242.0 lb

## 2022-01-30 DIAGNOSIS — E1169 Type 2 diabetes mellitus with other specified complication: Secondary | ICD-10-CM | POA: Diagnosis not present

## 2022-01-30 DIAGNOSIS — E669 Obesity, unspecified: Secondary | ICD-10-CM

## 2022-01-30 DIAGNOSIS — Z9989 Dependence on other enabling machines and devices: Secondary | ICD-10-CM

## 2022-01-30 DIAGNOSIS — Z789 Other specified health status: Secondary | ICD-10-CM | POA: Diagnosis not present

## 2022-01-30 DIAGNOSIS — I10 Essential (primary) hypertension: Secondary | ICD-10-CM | POA: Diagnosis not present

## 2022-01-30 DIAGNOSIS — D696 Thrombocytopenia, unspecified: Secondary | ICD-10-CM

## 2022-01-30 DIAGNOSIS — E785 Hyperlipidemia, unspecified: Secondary | ICD-10-CM

## 2022-01-30 DIAGNOSIS — K2100 Gastro-esophageal reflux disease with esophagitis, without bleeding: Secondary | ICD-10-CM

## 2022-01-30 DIAGNOSIS — G4733 Obstructive sleep apnea (adult) (pediatric): Secondary | ICD-10-CM

## 2022-01-30 NOTE — Progress Notes (Signed)
Name: Ryan Ritter   MRN: [id number]    DOB: 14-May-1966   Date:01/30/2022 ? ?     Progress Note ? ?Subjective ? ?Chief Complaint ? ?Follow up  ? ?HPI ? ?DMII: since March he has a CGM  his glucose is going down , his 90 day average A1C of 8.0%, down to 7  % and last 30 day and 14 days 6.4 % . He is on Tresiba 26 units and Metformin 1500 mg daily . He is eliminated carbs and is frustrated because glucose still spikes at times. He will see Dr Eddie Dibbles at the end of the month and not interested in adjusting medications at this time. Explained since no longer in glucose toxicity he may want to switch or add SGL-2 agonist and or GLP-1 agonist. He could not tolerate Rosuvastatin - affected his memory. He is on intermittent fasting, eating from 9 am to 1 pm.  ? ?OSA: he is using CPAP machine, wakes up feeling rested. Unchanged  ? ?Obesity: he is on a low carb diet, and intermittent fasting. Weight is stable   ? ?Thrombocytopenia: reviewed previous labs with him, it has been hovering around 140 for years. Paternal grandfather had Multiple Myeloma and Maternal grandfather had leukemia.  ? ?Patient Active Problem List  ? Diagnosis Date Noted  ? Thrombocytopenia ([place]) 06/06/2021  ? Statin intolerance 01/11/2021  ? Overweight (BMI 25.0-29.9) 07/26/2020  ? Type 2 diabetes mellitus with hyperglycemia, without long-term current use of insulin ([place]) 03/10/2019  ? 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 Vitiligo 09/09/2018  ? OSA on CPAP 05/29/2017  ? Low HDL (under 40) 11/08/2016  ? Spondylolysis of cervical region 08/08/2016  ? Family history of colonic polyps 03/14/2015  ? Gastroesophageal reflux disease with esophagitis without hemorrhage 03/14/2015  ? 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 Hypotestosteronism 03/14/2015  ? Family history of malignant neoplasm of prostate 05/21/2008  ? Hyperlipidemia LDL goal <70 01/29/2007  ? ? ?Past Surgical History:  ?Procedure Laterality Date  ? ANAL FISSURE REPAIR    ? tendinitis Left   ? TRIGGER FINGER RELEASE     ? vascectomy  08/2014  ? ? ?Family History  ?Problem Relation Age of Onset  ? Breast cancer Mother   ? Colon polyps Mother   ? Diabetes Father   ? Pancreatic cancer Father   ? Diabetes Brother   ? Diabetes Maternal Grandmother   ? Cancer Maternal Grandfather   ?     leukemia  ? Cancer Paternal Grandfather   ?     multiple myeloma  ? ? ?Social History  ? ?Tobacco Use  ? Smoking status: Never  ? Smokeless tobacco: Former  ?  Types: Chew  ?  Quit date: 10/01/1988  ?Substance Use Topics  ? Alcohol use: No  ?  Alcohol/week: 0.0 standard drinks  ? ? ? ?Current Outpatient Medications:  ?  Accu-Chek Softclix Lancets lancets, SMARTSIG:2 Topical Twice Daily, Disp: , Rfl:  ?  Boron 3 MG CAPS, Take 1 capsule by mouth 3 (three) times daily., Disp: , Rfl:  ?  Chromium 200 MCG CAPS, Take 200 mcg by mouth 2 (two) times daily. , Disp: , Rfl:  ?  Cinnamon 500 MG TABS, Take 1,000 tablets by mouth 2 (two) times daily., Disp: , Rfl:  ?  Cod Liver Oil 5000-500 UNIT/5ML OIL, Take by mouth., Disp: , Rfl:  ?  Coenzyme Q10 (CO Q10) 200 MG CAPS, Take 200 mg by mouth daily., Disp: , Rfl:  ?  Continuous Blood Gluc Sensor (DEXCOM G7 SENSOR) MISC, 1 each by Does not apply route as directed. Apply one every 10 days, Disp: 9 each, Rfl: 1 ?  EPINEPHrine 0.3 mg/0.3 mL IJ SOAJ injection, epinephrine 0.3 mg/0.3 mL injection, auto-injector, Disp: , Rfl:  ?  FLOWFLEX COVID-19 AG HOME TEST KIT, FOLLOW INSTRUCTIONS INCLUDED WITH THE PACKAGE., Disp: , Rfl:  ?  fluticasone (FLONASE) 50 MCG/ACT nasal spray, 2 sprays in each nostril Once a day Nasally 30 days, Disp: , Rfl:  ?  glucose blood (ACCU-CHEK GUIDE) test strip, USE TO TEST UP TO 4 TIMES A DAY, Disp: 100 strip, Rfl: 2 ?  insulin degludec (TRESIBA) 100 UNIT/ML FlexTouch Pen, Inject 16-50 Units into the skin daily., Disp: 15 mL, Rfl: 0 ?  Insulin Pen Needle 32G X 4 MM MISC, 1 each by Does not apply route daily at 12 noon. Twice daily prn, Disp: 100 each, Rfl: 2 ?  levocetirizine (XYZAL) 5 MG tablet, Take  5 mg by mouth every evening. , Disp: , Rfl:  ?  metFORMIN (GLUCOPHAGE-XR) 750 MG 24 hr tablet, Take 2 tablets (1,500 mg total) by mouth daily with breakfast., Disp: 180 tablet, Rfl: 0 ? ?Allergies  ?Allergen Reactions  ? Benicar [Olmesartan] Other (See Comments)  ? Lisinopril Other (See Comments)  ? ? ?I personally reviewed active problem list, medication list, allergies, family history, social history with the patient/caregiver today. ? ? ?ROS ? ?Constitutional: Negative for fever or weight change.  ?Respiratory: Negative for cough and shortness of breath.   ?Cardiovascular: Negative for chest pain or palpitations.  ?Gastrointestinal: Negative for abdominal pain, no bowel changes.  ?Musculoskeletal: Negative for gait problem or joint swelling.  ?Skin: Negative for rash.  ?Neurological: Negative for dizziness or headache.  ?No other specific complaints in a complete review of systems (except as listed in HPI above).  ? ?Objective ? ?Vitals:  ? 01/30/22 1556  ?BP: 130/68  ?Pulse: 63  ?Resp: 18  ?Temp: 98.2 ?F (36.8 ?C)  ?TempSrc: Oral  ?SpO2: 99%  ?Weight: 242 lb (109.8 kg)  ?Height: 6' (1.829 m)  ? ? ?Body mass index is 32.82 kg/m?. ? ?Physical Exam ? ?Constitutional: Patient appears well-developed and well-nourished. Obese  No distress.  ?HEENT: head atraumatic, normocephalic, pupils equal and reactive to light,, neck supple ?Cardiovascular: Normal rate, regular rhythm and normal heart sounds.  No murmur heard. No BLE edema. ?Pulmonary/Chest: Effort normal and breath sounds normal. No respiratory distress. ?Abdominal: Soft.  There is no tenderness. ?Psychiatric: Patient has a normal mood and affect. behavior is normal. Judgment and thought content normal.  ? ?Recent Results (from the past 2160 hour(s))  ?POCT Glucose (CBG)     Status: Abnormal  ? Collection Time: 12/05/21  8:01 AM  ?Result Value Ref Range  ? POC Glucose 328 (A) 70 - 99 mg/dl  ?HgB A1c     Status: Abnormal  ? Collection Time: 12/05/21  8:30 AM   ?Result Value Ref Range  ? Hgb A1c MFr Bld 9.1 (H) <5.7 % of total Hgb  ?  Comment: For someone without known diabetes, a hemoglobin A1c ?value of 6.5% or greater indicates that they may have  ?diabetes and this should be confirmed with a follow-up  ?test. ?. ?For someone with known diabetes, a value <7% indicates  ?that their diabetes is well controlled and a value  ?greater than or equal to 7% indicates suboptimal  ?control. A1c targets should be individualized based on  ?duration of diabetes, age, comorbid  conditions, and  ?other considerations. ?. ?Currently, no consensus exists regarding use of ?hemoglobin A1c for diagnosis of diabetes for children. ?. ?  ? Mean Plasma Glucose 214 mg/dL  ? eAG (mmol/L) 11.9 mmol/L  ?Microalbumin / creatinine urine ratio     Status: None  ? Collection Time: 12/05/21  8:30 AM  ?Result Value Ref Range  ? Creatinine, Urine 94 20 - 320 mg/dL  ? Microalb, Ur 0.6 mg/dL  ?  Comment: Reference Range ?Not established ?  ? Microalb Creat Ratio 6 <30 mcg/mg creat  ?  Comment: . ?The ADA defines abnormalities in albumin ?excretion as follows: ?. ?Albuminuria Category        Result (mcg/mg creatinine) ?Marland Kitchen ?Normal to Mildly increased   <30 ?Moderately increased         30-299  ?Severely increased           > OR = 300 ?. ?The ADA recommends that at least two of three ?specimens collected within a 3-6 month period be ?abnormal before considering a patient to be ?within a diagnostic category. ?  ?Lipid panel     Status: Abnormal  ? Collection Time: 12/05/21  8:30 AM  ?Result Value Ref Range  ? Cholesterol 207 (H) <200 mg/dL  ? HDL 30 (L) > OR = 40 mg/dL  ? Triglycerides 231 (H) <150 mg/dL  ?  Comment: . ?If a non-fasting specimen was collected, consider ?repeat triglyceride testing on a fasting specimen ?if clinically indicated.  ?Garald Balding al. J. of Clin. Lipidol. 2446;2:863-817. ?. ?  ? LDL Cholesterol (Calc) 140 (H) mg/dL (calc)  ?  Comment: Reference range: <100 ?Marland Kitchen ?Desirable range <100  mg/dL for primary prevention;   ?<70 mg/dL for patients with CHD or diabetic patients  ?with > or = 2 CHD risk factors. ?. ?LDL-C is now calculated using the Martin-Hopkins  ?calculation, which is a valida

## 2022-01-31 ENCOUNTER — Telehealth: Payer: Self-pay | Admitting: Urology

## 2022-01-31 NOTE — Telephone Encounter (Signed)
DOS - 03/02/22 ? ?METATARSAL OSTEOTOMY 2ND LEFT --- 28308 ?HAMMERTOE REPAIR 2ND LEFT --- 37048 ?INJECTION LEFT --- 20550 ? ?[employer]BS EFFECTIVE DATE - 10/01/21 ? ?PLAN DEDUCTIBLE - $1,250.00 W/ $1,250.00 REMAINING ?OUT OF POCKET - $4,890.00 W/ $4,382.00 REMAINING ?COINSURANCE - 20% ?COPAY - $0.00 ? ? ?NO PRIOR AUTH REQUIRED. ?

## 2022-02-28 ENCOUNTER — Other Ambulatory Visit: Payer: Self-pay | Admitting: Podiatry

## 2022-02-28 MED ORDER — OXYCODONE-ACETAMINOPHEN 10-325 MG PO TABS: 1.0000 | ORAL_TABLET | Freq: Three times a day (TID) | ORAL | 0 refills | Status: AC | PRN

## 2022-02-28 MED ORDER — ONDANSETRON HCL 4 MG PO TABS: 4.0000 mg | ORAL_TABLET | Freq: Three times a day (TID) | ORAL | 0 refills | Status: DC | PRN

## 2022-02-28 MED ORDER — CEPHALEXIN 500 MG PO CAPS: 500.0000 mg | ORAL_CAPSULE | Freq: Three times a day (TID) | ORAL | 0 refills | Status: DC

## 2022-03-02 DIAGNOSIS — M2042 Other hammer toe(s) (acquired), left foot: Secondary | ICD-10-CM

## 2022-03-02 DIAGNOSIS — M722 Plantar fascial fibromatosis: Secondary | ICD-10-CM | POA: Diagnosis not present

## 2022-03-02 DIAGNOSIS — M21542 Acquired clubfoot, left foot: Secondary | ICD-10-CM

## 2022-03-02 HISTORY — PX: METATARSAL OSTEOTOMY: SHX1641

## 2022-03-02 HISTORY — PX: HAMMER TOE SURGERY: SHX385

## 2022-03-07 ENCOUNTER — Ambulatory Visit (INDEPENDENT_AMBULATORY_CARE_PROVIDER_SITE_OTHER): Payer: BC Managed Care – PPO | Admitting: Podiatry

## 2022-03-07 ENCOUNTER — Ambulatory Visit (INDEPENDENT_AMBULATORY_CARE_PROVIDER_SITE_OTHER): Payer: BC Managed Care – PPO

## 2022-03-07 ENCOUNTER — Encounter: Payer: Self-pay | Admitting: Podiatry

## 2022-03-07 ENCOUNTER — Other Ambulatory Visit: Payer: Self-pay | Admitting: Family Medicine

## 2022-03-07 DIAGNOSIS — M722 Plantar fascial fibromatosis: Secondary | ICD-10-CM | POA: Diagnosis not present

## 2022-03-07 DIAGNOSIS — M21962 Unspecified acquired deformity of left lower leg: Secondary | ICD-10-CM | POA: Diagnosis not present

## 2022-03-07 DIAGNOSIS — Z9889 Other specified postprocedural states: Secondary | ICD-10-CM

## 2022-03-07 DIAGNOSIS — M2042 Other hammer toe(s) (acquired), left foot: Secondary | ICD-10-CM

## 2022-03-07 DIAGNOSIS — E1165 Type 2 diabetes mellitus with hyperglycemia: Secondary | ICD-10-CM

## 2022-03-07 NOTE — Progress Notes (Signed)
He presents today for his postop visit date of surgery 03/02/2022 left foot second metatarsal osteotomy and hammertoe.  He states that is pretty intense until about Monday then started to feel better.  Objective: Vital signs are stable he is alert oriented x3 presents today dressed her dressing intact utilizing his cam walker.  Once a dry sterile dressing was removed demonstrates minimal edema mild erythema no cellulitis drainage odor incision is intact sutures are intact.  Radiographs taken today demonstrate an osseously mature foot with a osteotomy to the second metatarsal with screw fixation.  Assessment: Well-healing surgical foot  Plan: Redressed today dressed a compressive dressing follow-up with him in 1 week for possible suture removal.

## 2022-03-14 ENCOUNTER — Encounter: Payer: BC Managed Care – PPO | Admitting: Podiatry

## 2022-03-15 ENCOUNTER — Encounter: Payer: Self-pay | Admitting: Podiatry

## 2022-03-15 ENCOUNTER — Ambulatory Visit (INDEPENDENT_AMBULATORY_CARE_PROVIDER_SITE_OTHER): Payer: BC Managed Care – PPO | Admitting: Podiatry

## 2022-03-15 DIAGNOSIS — M21962 Unspecified acquired deformity of left lower leg: Secondary | ICD-10-CM | POA: Diagnosis not present

## 2022-03-16 NOTE — Progress Notes (Signed)
Subjective:   Patient ID: Ryan Ritter, male   DOB: 56 y.o.   MRN: [id number]   HPI Patient states doing well needs to get the stitches out of his right dorsal foot.  Several weeks after having foot surgery left   ROS      Objective:  Physical Exam  Neurovascular status intact with patient found to have stitches intact dorsal left that is doing well with no drainage wound edges well coapted and minimal swelling     Assessment:  Doing well post osteotomy second metatarsal left foot Dr. Al Corpus     Plan:  Stitches removed sterile dressing reapplied.  I advised on continued boot usage and I did dispense surgical shoe that he can gradually start wearing over the next few weeks and he will see Dr. Al Corpus back in 4 weeks.  Continue mobilization to that time to continue elevation and oral anti-inflammatories as needed

## 2022-03-28 ENCOUNTER — Ambulatory Visit (INDEPENDENT_AMBULATORY_CARE_PROVIDER_SITE_OTHER): Payer: BC Managed Care – PPO | Admitting: Podiatry

## 2022-03-28 ENCOUNTER — Encounter: Payer: Self-pay | Admitting: Podiatry

## 2022-03-28 ENCOUNTER — Ambulatory Visit (INDEPENDENT_AMBULATORY_CARE_PROVIDER_SITE_OTHER): Payer: BC Managed Care – PPO

## 2022-03-28 DIAGNOSIS — M2042 Other hammer toe(s) (acquired), left foot: Secondary | ICD-10-CM

## 2022-03-28 DIAGNOSIS — S86312A Strain of muscle(s) and tendon(s) of peroneal muscle group at lower leg level, left leg, initial encounter: Secondary | ICD-10-CM | POA: Diagnosis not present

## 2022-03-28 DIAGNOSIS — Z9889 Other specified postprocedural states: Secondary | ICD-10-CM

## 2022-03-28 DIAGNOSIS — M722 Plantar fascial fibromatosis: Secondary | ICD-10-CM

## 2022-03-28 DIAGNOSIS — M21962 Unspecified acquired deformity of left lower leg: Secondary | ICD-10-CM

## 2022-03-28 NOTE — Progress Notes (Signed)
He presents today date of surgery March 02, 2022 metatarsal osteotomy and hammertoe.  States that is doing good none still little tender on the bottom he likes to wear the boot rather than the surgical shoe.  Objective: Vital signs are stable he is alert and oriented x3 radiographically the osteotomy appears to be healing very nicely internal fixation is in good position.  That he does have some dorsal contraction of the toe.  Assessment: Well-healing surgical foot.  Plan: He is going to follow-up with me or Dr. Lilian Kapur in the next few weeks I did encourage him to continue stretching exercises to help bring the toe down may need to consider options physical therapy to help with that as well.

## 2022-04-16 ENCOUNTER — Ambulatory Visit (INDEPENDENT_AMBULATORY_CARE_PROVIDER_SITE_OTHER): Payer: BC Managed Care – PPO

## 2022-04-16 ENCOUNTER — Encounter: Payer: Self-pay | Admitting: Podiatry

## 2022-04-16 ENCOUNTER — Ambulatory Visit (INDEPENDENT_AMBULATORY_CARE_PROVIDER_SITE_OTHER): Payer: BC Managed Care – PPO | Admitting: Podiatry

## 2022-04-16 DIAGNOSIS — M21962 Unspecified acquired deformity of left lower leg: Secondary | ICD-10-CM

## 2022-04-16 DIAGNOSIS — Z9889 Other specified postprocedural states: Secondary | ICD-10-CM

## 2022-04-16 NOTE — Progress Notes (Signed)
He presents today with his daughter Cammy Copa date of surgery 03/03/2019 3-second metatarsal osteotomy left foot.  He states that he is getting a little bit of pain but not too bad.  Has worn the [place] shoe but likes to wear the boot better.  Objective: Vital signs are stable he is alert and oriented x3 pulses are palpable.  Incision site is gone on to heal quite nicely and his at home physical therapy seems to be loosening the scar tissue and the toe appears to be sitting a little more rectus than it has previously.  Radiographs taken today demonstrate a healing capital osteotomy with screw fixation.  Assessment well-healing surgical foot.  Plan: I would like for him to follow-up with me in 2 to 3 weeks and try different shoes at home.

## 2022-05-09 ENCOUNTER — Ambulatory Visit (INDEPENDENT_AMBULATORY_CARE_PROVIDER_SITE_OTHER): Payer: BC Managed Care – PPO

## 2022-05-09 ENCOUNTER — Encounter: Payer: Self-pay | Admitting: Podiatry

## 2022-05-09 ENCOUNTER — Ambulatory Visit (INDEPENDENT_AMBULATORY_CARE_PROVIDER_SITE_OTHER): Payer: BC Managed Care – PPO | Admitting: Podiatry

## 2022-05-09 DIAGNOSIS — M21962 Unspecified acquired deformity of left lower leg: Secondary | ICD-10-CM | POA: Diagnosis not present

## 2022-05-09 DIAGNOSIS — Z9889 Other specified postprocedural states: Secondary | ICD-10-CM

## 2022-05-09 NOTE — Progress Notes (Signed)
He presents today for follow-up of his [place] metatarsal osteotomy.  He states that he started to have more pain similar to the pain he was having prior to surgery beneath the second metatarsal phalangeal joint.  Objective: Vital signs are stable alert oriented x 3 radiographs taken today demonstrate that the osteotomy site is healing well internal fixation is in good position he has a bit of hammertoe deformity there with some elevation most likely due to scar retraction.  Assessment pain around the surgical site plantarly most likely due to scar retraction.  Plan: I went to send him to physical therapy to increase range of motion and to help decrease the pain.

## 2022-05-14 ENCOUNTER — Encounter: Payer: Self-pay | Admitting: Podiatry

## 2022-05-17 ENCOUNTER — Other Ambulatory Visit: Payer: Self-pay

## 2022-05-17 ENCOUNTER — Ambulatory Visit: Payer: BC Managed Care – PPO | Attending: Podiatry | Admitting: Physical Therapy

## 2022-05-17 DIAGNOSIS — M21962 Unspecified acquired deformity of left lower leg: Secondary | ICD-10-CM | POA: Diagnosis not present

## 2022-05-17 DIAGNOSIS — M79675 Pain in left toe(s): Secondary | ICD-10-CM

## 2022-05-17 DIAGNOSIS — Z9889 Other specified postprocedural states: Secondary | ICD-10-CM | POA: Insufficient documentation

## 2022-05-17 DIAGNOSIS — M79672 Pain in left foot: Secondary | ICD-10-CM

## 2022-05-17 NOTE — Therapy (Signed)
OUTPATIENT PHYSICAL THERAPY LOWER EXTREMITY EVALUATION   Patient Name: Ryan Ritter MRN: [id number] DOB:01/30/1966, 56 y.o., male Today's Date: 05/17/2022   PT End of Session - 05/17/22 1851     Visit Number 1    Number of Visits 10    Date for PT Re-Evaluation 07/26/22    Authorization Type BCBS    PT Start Time 1635    PT Stop Time 3354    PT Time Calculation (min) 40 min    Activity Tolerance Patient tolerated treatment well    Behavior During Therapy [facility] for tasks assessed/performed             Past Medical History:  Diagnosis Date   Allergy    Benign essential HTN 01/29/2007   Diabetes mellitus without complication ([place])    Hyperlipidemia    Mass of left thigh 10/09/2018   Obesity 03/14/2015   OSA on CPAP 05/29/2017   Plantar fasciitis 09/30/2015   Sleep apnea    Past Surgical History:  Procedure Laterality Date   ANAL FISSURE REPAIR     tendinitis Left    TRIGGER FINGER RELEASE     vascectomy  08/2014   Patient Active Problem List   Diagnosis Date Noted   Thrombocytopenia ([place]) 06/06/2021   Statin intolerance 01/11/2021   Overweight (BMI 25.0-29.9) 07/26/2020   Type 2 diabetes mellitus with hyperglycemia, without long-term current use of insulin ([place]) 03/10/2019   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 Steele Sizer   REFERRING PROVIDER: Dr. Tyson Dense   REFERRING DIAG:  [phone] (ICD-10-CM) - Deformity of metatarsal bone of left foot Z98.890 (ICD-10-CM) - Status post left foot surgery  THERAPY DIAG:  Pain in left toe(s)  Pain in  left foot  Rationale for Evaluation and Treatment Rehabilitation  ONSET DATE: June 2022   SUBJECTIVE:   SUBJECTIVE STATEMENT: Pt reports that he has ongoing 2nd metatarsal stiffness from a metatarsal osteotomy on June 2nd. He has had ongoing pain in ball of foot along with pain at the top of 2nd met due to toe rubbing against the top of shoe because it is stuck in extension. He has tried self manipulations with no increased in toe flexion. 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 He has orthotics that he wears with his work boots, but he has not attempted wearing boots since having the procedure because he is afraid of the pain.   PERTINENT HISTORY: Same as subjective above.   PAIN:  Are you having pain? Yes: NPRS scale: 2-3/10 Pain location: Left toe on Left foot  Pain description: Achy pain  Aggravating factors: Walking throughout the dya  Relieving factors: Moleskin or bandage   PRECAUTIONS: None  WEIGHT BEARING RESTRICTIONS No  FALLS:  Has patient fallen in last 6 months? No  LIVING ENVIRONMENT:  Information not taken Lives with: lives with their family and Not asked  Lives in: Other Not asked  Stairs:  Not ask ed Has following equipment at home:  None  OCCUPATION: Mechanic for [employer]   PLOF: Independent  PATIENT GOALS To resolve left foot pain.    OBJECTIVE:   DIAGNOSTIC FINDINGS: CLINICAL DATA:  Foot trauma. Tendon injury or dislocation suspected. Evaluate hammertoe of the second digit with possible capsule tear of the second metacarpophalangeal joint. Surgical consideration.   EXAM: MRI OF THE LEFT FOOT WITHOUT CONTRAST   TECHNIQUE: Multiplanar, multisequence MR imaging of the left forefoot was performed. No intravenous contrast was administered.   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 Previously there was predominantly degenerative cystic change in this region, now with interval increase in surrounding edema (sagittal series 8 images 16 through 21). This edema is high-grade throughout the entire intermediate cuneiform (sagittal image 18).   Small great toe metatarsophalangeal joint and second metatarsophalangeal joint effusions. The plantar plates appear intact. No definite capsular tear is seen. There is moderate flexion positioning of the second through fourth PIP joints.   Mild medial and lateral great toe metatarsophalangeal joint sesamoid marrow edema likely degenerative change.   There appears to be moderate cartilage thinning and mild peripheral degenerative osteophytosis of the tarsometatarsal joints diffusely.   Ligaments   Lisfranc ligament complex appears intact.   Muscles and Tendons   The visualized flexor and extensor tendons appear intact.   Soft tissues   There is mild edema and swelling within the subcutaneous fat of the fourth and fifth toes.   IMPRESSION:: IMPRESSION: 1. Moderate flexion positioning of second through fourth PIP joints. No definite capsule tear is identified with attention to the second metatarsophalangeal joint. There are mild first and second metatarsophalangeal joint effusions. 2. 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 Diffuse marrow edema throughout the entire intermediate cuneiform may be secondary to stress related and/or degenerative changes, likely a source of pain within the midfoot.  PATIENT SURVEYS:  FOTO 62/100 with target of 8  COGNITION:  Overall cognitive status: Within functional limits for tasks assessed     SENSATION: WFL  EDEMA: No swelling noted  over left foot     POSTURE: No Significant postural limitations  PALPATION: Ventral surface of left foot on toe pad   LOWER EXTREMITY ROM: 2nd toe on left foot PROM 5 deg ext  GAIT: Distance walked: 40 ft  Assistive device utilized: None Level of assistance: Complete Independence Comments: Supination with lateral wearing of shoe tread     TODAY'S TREATMENT: Left Knee Flexion with dorsiflexion    PATIENT EDUCATION:  Education details: form and technique for appropriate exercise  Person educated: Patient Education method: Customer service manager Education comprehension: verbalized understanding and returned demonstration   HOME EXERCISE PROGRAM: Left Toe Flexion with knee flexion and dorsiflexion 3 sec hold 3 x 10 x 3 days per week    ASSESSMENT:  CLINICAL IMPRESSION: Patient is a 56 y.o. white male who was seen today for physical therapy evaluation and treatment for left foot and toe pain secondary to recent osteotomy of 2nd metatarsal on left foot. He exhibits increased left metatarsal extension along with pain in ball of left foot and pain on top of left metatarsal. 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  OBJECTIVE IMPAIRMENTS pain, abnormal gait, and decreased ROM.   ACTIVITY LIMITATIONS locomotion level and walking long distances   PARTICIPATION LIMITATIONS: community activity and occupation  PERSONAL FACTORS Time since onset of injury/illness/exacerbation are also affecting patient's functional outcome.   REHAB POTENTIAL: Good  CLINICAL DECISION MAKING: Stable/uncomplicated  EVALUATION COMPLEXITY: Low   GOALS: Goals reviewed with patient? No  SHORT TERM GOALS: Target date: 05/31/2022  Pt will be independent with HEP in order to improve strength and balance in order to decrease  fall risk and improve function at home and work. Baseline: NT  Goal status: INITIAL  2.  Patient will demonstrate knowledge of self mobilizations by accurately performing self mobilizations with no cueing for improved outcomes for 2nd digit ROM.  Baseline: NT  Goal status: INITIAL   LONG TERM GOALS: Target date: 07/26/2022   Patient will have improved function and activity level as evidenced by an increase in FOTO score by 10 points or more.  Baseline: 62/100 Goal status: INITIAL  2.  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  Baseline: NPS 3/10  Goal status: INITIAL     PLAN: PT FREQUENCY: 1-2x/week  PT DURATION: 10 weeks  PLANNED INTERVENTIONS: Therapeutic exercises, Therapeutic activity, Neuromuscular re-education, Balance training, Gait training, Patient/Family education, Self Care, Joint mobilization, Joint manipulation, Stair training, Orthotic/Fit training, Cryotherapy, Moist heat, scar mobilization, Manual therapy, and Re-evaluation  PLAN FOR NEXT SESSION: Trial patient wearing orthotics with boots, eversion strengthening to attempt to correct supination, manual therapy with joint mobilizations   Bradly Chris PT, DPT  05/17/2022, 6:53 PM

## 2022-05-18 LAB — HM DIABETES EYE EXAM

## 2022-05-21 ENCOUNTER — Ambulatory Visit: Payer: BC Managed Care – PPO | Admitting: Physical Therapy

## 2022-05-21 ENCOUNTER — Encounter: Payer: Self-pay | Admitting: Physical Therapy

## 2022-05-21 DIAGNOSIS — M79675 Pain in left toe(s): Secondary | ICD-10-CM

## 2022-05-21 DIAGNOSIS — M79672 Pain in left foot: Secondary | ICD-10-CM

## 2022-05-21 NOTE — Therapy (Signed)
OUTPATIENT PHYSICAL THERAPY TREATMENT NOTE   Patient Name: Ryan Ritter MRN: [id number] DOB:25-Oct-1965, 56 y.o., male Today's Date: 05/21/2022  PCP: Dr. Steele Sizer  REFERRING PROVIDER: Dr. Tyson Dense   END OF SESSION:   PT End of Session - 05/21/22 1509     Visit Number 2    Number of Visits 10    Date for PT Re-Evaluation 07/26/22    Authorization Type BCBS    PT Start Time 1500    PT Stop Time 6599    PT Time Calculation (min) 45 min    Activity Tolerance Patient tolerated treatment well    Behavior During Therapy [facility] for tasks assessed/performed             Past Medical History:  Diagnosis Date   Allergy    Benign essential HTN 01/29/2007   Diabetes mellitus without complication ([place])    Hyperlipidemia    Mass of left thigh 10/09/2018   Obesity 03/14/2015   OSA on CPAP 05/29/2017   Plantar fasciitis 09/30/2015   Sleep apnea    Past Surgical History:  Procedure Laterality Date   ANAL FISSURE REPAIR     tendinitis Left    TRIGGER FINGER RELEASE     vascectomy  08/2014   Patient Active Problem List   Diagnosis Date Noted   Thrombocytopenia ([place]) 06/06/2021   Statin intolerance 01/11/2021   Overweight (BMI 25.0-29.9) 07/26/2020   Type 2 diabetes mellitus with hyperglycemia, without long-term current use of insulin ([place]) 03/10/2019   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   Vitiligo 09/09/2018   OSA on CPAP 05/29/2017   Low HDL (under 40) 11/08/2016   Spondylolysis of cervical region 08/08/2016   Family history of colonic polyps 03/14/2015   Gastroesophageal reflux disease with esophagitis without hemorrhage 03/14/2015   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 He has had ongoing pain in ball of foot along with pain at the top of 2nd met due to toe rubbing against the top of shoe because it is stuck in extension. He has tried self manipulations with no increased in toe flexion. 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 He has orthotics that he wears with his work boots, but he has not attempted wearing boots since having the procedure because he is afraid of the pain.   PRECAUTIONS: None   SUBJECTIVE: Pt reports increased pain while doing the exercises.   PAIN:  Are you having pain? Yes: NPRS scale: 2/10 Pain location: Pain on the top of the 2nd digit. Pain description: Soreness  Aggravating factors: Wearing tight shoes or walking on foot Relieving factors: Sitting still    OBJECTIVE: (objective measures completed at initial evaluation unless otherwise dated)  DIAGNOSTIC FINDINGS: CLINICAL DATA:  Foot trauma. Tendon injury or dislocation suspected. Evaluate hammertoe of the second digit with possible capsule tear of the second metacarpophalangeal joint. Surgical consideration.   EXAM: MRI OF THE LEFT FOOT  WITHOUT CONTRAST   TECHNIQUE: Multiplanar, multisequence MR imaging of the left forefoot was performed. No intravenous contrast was administered.   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 Previously there was predominantly degenerative cystic change in this region, now  with interval increase in surrounding edema (sagittal series 8 images 16 through 21). This edema is high-grade throughout the entire intermediate cuneiform (sagittal image 18).   Small great toe metatarsophalangeal joint and second metatarsophalangeal joint effusions. The plantar plates appear intact. No definite capsular tear is seen. There is moderate flexion positioning of the second through fourth PIP joints.   Mild medial and lateral great toe metatarsophalangeal joint sesamoid marrow edema likely degenerative change.   There appears to be moderate cartilage thinning and mild peripheral degenerative osteophytosis of the tarsometatarsal joints diffusely.   Ligaments   Lisfranc ligament complex appears intact.   Muscles and Tendons   The visualized flexor and extensor tendons appear intact.   Soft tissues   There is mild edema and swelling within the subcutaneous fat of the fourth and fifth toes.   IMPRESSION:: IMPRESSION: 1. Moderate flexion positioning of second through fourth PIP joints. No definite capsule tear is identified with attention to the second metatarsophalangeal joint. There are mild first and second metatarsophalangeal joint effusions. 2. 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 Diffuse marrow edema throughout the entire intermediate cuneiform may be secondary to stress related and/or degenerative changes, likely a source of pain within the midfoot.   PATIENT SURVEYS:  FOTO 62/100 with target of 33   COGNITION:           Overall cognitive status: Within functional limits for tasks assessed                          SENSATION: WFL   EDEMA: No swelling noted over left foot        POSTURE: No Significant postural limitations   PALPATION: Ventral surface of left foot on toe pad    LOWER EXTREMITY ROM: 2nd toe on left foot PROM 5 deg ext   GAIT: Distance walked: 40 ft  Assistive device utilized: None Level of  assistance: Complete Independence Comments: Supination with lateral wearing of shoe tread        TODAY'S TREATMENT:  05/21/22:   THEREX:   Long Sitting Toe Ext Stretch 3 x 30 sec  Long Sitting Toe Ext and Flex AROM 3 x 10 Full Kneel Toe Ext 3 x 30 sec  Calf Stretch 3 x 30 sec              Manual Therapy                        Transmetatarsal mobilizations             2nd digit traction with flexion and extension mobilizations              Myofacial release of plantar surface of left foot              Myofacial release of calf   Initial:  Left Knee Flexion with dorsiflexion      PATIENT EDUCATION:  Education details: form and technique for appropriate exercise  Person educated: Patient Education method: Explanation and Demonstration Education comprehension: verbalized understanding and  returned demonstration     HOME EXERCISE PROGRAM:            Left Toe Flexion with knee flexion and dorsiflexion 3 sec hold 3 x 10 x 3 days per week   Long Sitting Toe Ext Stretch 3 x 30 sec x 7 days per week  Long Sitting Toe Ext and Flex AROM 3 x 10 x 7 days per week  Full Kneel Toe Ext 3 x 30 sec x 7 days per week  Calf Stretch 3 x 30 sec x 7 days per week    ASSESSMENT:   CLINICAL IMPRESSION:  Pt reports for f/u for pain in left 2nd digit 2/2 to hammer toe. He was able to complete all exercises without an increase in his pain. Fascial restrictions over plantar surface decreased since the beginning of visit. 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       OBJECTIVE IMPAIRMENTS pain, abnormal gait, and decreased ROM.    ACTIVITY LIMITATIONS locomotion level and walking long distances    PARTICIPATION LIMITATIONS: community activity and occupation   PERSONAL FACTORS Time since onset of injury/illness/exacerbation  are also affecting patient's functional outcome.    REHAB POTENTIAL: Good   CLINICAL DECISION MAKING: Stable/uncomplicated   EVALUATION COMPLEXITY: Low     GOALS: Goals reviewed with patient? No   SHORT TERM GOALS: Target date: 05/31/2022  Pt will be independent with HEP in order to improve strength and balance in order to decrease fall risk and improve function at home and work. Baseline: NT  Goal status: ONGOING    2.  Patient will demonstrate knowledge of self mobilizations by accurately performing self mobilizations with no cueing for improved outcomes for 2nd digit ROM.  Baseline: NT  Goal status: ONGOING      LONG TERM GOALS: Target date: 07/26/2022    Patient will have improved function and activity level as evidenced by an increase in FOTO score by 10 points or more.  Baseline: 62/100 Goal status: ONGOING    2.  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  Baseline: NPS 3/10  Goal status: ONGOING          PLAN: PT FREQUENCY: 1-2x/week   PT DURATION: 10 weeks   PLANNED INTERVENTIONS: Therapeutic exercises, Therapeutic activity, Neuromuscular re-education, Balance training, Gait training, Patient/Family education, Self Care, Joint mobilization, Joint manipulation, Stair training, Orthotic/Fit training, Cryotherapy, Moist heat, scar mobilization, Manual therapy, and Re-evaluation   PLAN FOR NEXT SESSION: Trial patient wearing orthotics with boots, eversion strengthening to attempt to correct supination, manual therapy with joint mobilizations   Bradly Chris PT, DPT  05/21/2022, 3:11 PM

## 2022-05-24 ENCOUNTER — Ambulatory Visit (INDEPENDENT_AMBULATORY_CARE_PROVIDER_SITE_OTHER): Payer: BC Managed Care – PPO | Admitting: Podiatry

## 2022-05-24 ENCOUNTER — Encounter: Payer: Self-pay | Admitting: Physical Therapy

## 2022-05-24 ENCOUNTER — Encounter: Payer: Self-pay | Admitting: Podiatry

## 2022-05-24 ENCOUNTER — Ambulatory Visit: Payer: BC Managed Care – PPO | Admitting: Physical Therapy

## 2022-05-24 DIAGNOSIS — M79675 Pain in left toe(s): Secondary | ICD-10-CM

## 2022-05-24 DIAGNOSIS — M21962 Unspecified acquired deformity of left lower leg: Secondary | ICD-10-CM

## 2022-05-24 DIAGNOSIS — Z9889 Other specified postprocedural states: Secondary | ICD-10-CM

## 2022-05-24 DIAGNOSIS — M79672 Pain in left foot: Secondary | ICD-10-CM

## 2022-05-24 NOTE — Therapy (Signed)
OUTPATIENT PHYSICAL THERAPY TREATMENT NOTE   Patient Name: Ryan Ritter MRN: [id number] DOB:[birth year], 56 y.o., male Today's Date: 05/24/2022  PCP: Dr. Steele Sizer  REFERRING PROVIDER: Dr. Tyson Dense   END OF SESSION:   PT End of Session - 05/24/22 1156     Visit Number 3    Number of Visits 10    Date for PT Re-Evaluation 07/26/22    Authorization Type BCBS    PT Start Time 1150    PT Stop Time 1230    PT Time Calculation (min) 40 min    Activity Tolerance Patient tolerated treatment well    Behavior During Therapy [facility] for tasks assessed/performed             Past Medical History:  Diagnosis Date   Allergy    Benign essential HTN 01/29/2007   Diabetes mellitus without complication ([place])    Hyperlipidemia    Mass of left thigh 10/09/2018   Obesity 03/14/2015   OSA on CPAP 05/29/2017   Plantar fasciitis 09/30/2015   Sleep apnea    Past Surgical History:  Procedure Laterality Date   ANAL FISSURE REPAIR     tendinitis Left    TRIGGER FINGER RELEASE     vascectomy  08/2014   Patient Active Problem List   Diagnosis Date Noted   Thrombocytopenia ([place]) 06/06/2021   Statin intolerance 01/11/2021   Overweight (BMI 25.0-29.9) 07/26/2020   Type 2 diabetes mellitus with hyperglycemia, without long-term current use of insulin ([place]) 03/10/2019   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   Vitiligo 09/09/2018   OSA on CPAP 05/29/2017   Low HDL (under 40) 11/08/2016   Spondylolysis of cervical region 08/08/2016   Family history of colonic polyps 03/14/2015   Gastroesophageal reflux disease with esophagitis without hemorrhage 03/14/2015   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 He has had ongoing pain in ball of foot along with pain at the top of 2nd met due to toe rubbing against the top of shoe because it is stuck in extension. He has tried self manipulations with no increased in toe flexion. 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 He has orthotics that he wears with his work boots, but he has not attempted wearing boots since having the procedure because he is afraid of the pain.   PRECAUTIONS: None   SUBJECTIVE: Pt reports increased pain in the left to especially when stretching the left foot.   PAIN:  Are you having pain? Yes: NPRS scale: 2/10 Pain location: Pain on the top of the 2nd digit. Pain description: Soreness  Aggravating factors: Wearing tight shoes or walking on foot Relieving factors: Sitting still    OBJECTIVE: (objective measures completed at initial evaluation unless otherwise dated)  DIAGNOSTIC FINDINGS: CLINICAL DATA:  Foot trauma. Tendon injury or dislocation suspected. Evaluate hammertoe of the second digit with possible capsule tear of the second metacarpophalangeal joint. Surgical consideration.  EXAM: MRI OF THE LEFT FOOT WITHOUT CONTRAST   TECHNIQUE: Multiplanar, multisequence MR imaging of the left forefoot was performed. No intravenous contrast was administered.   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 Previously there was predominantly  degenerative cystic change in this region, now with interval increase in surrounding edema (sagittal series 8 images 16 through 21). This edema is high-grade throughout the entire intermediate cuneiform (sagittal image 18).   Small great toe metatarsophalangeal joint and second metatarsophalangeal joint effusions. The plantar plates appear intact. No definite capsular tear is seen. There is moderate flexion positioning of the second through fourth PIP joints.   Mild medial and lateral great toe metatarsophalangeal joint sesamoid marrow edema likely degenerative change.   There appears to be moderate cartilage thinning and mild peripheral degenerative osteophytosis of the tarsometatarsal joints diffusely.   Ligaments   Lisfranc ligament complex appears intact.   Muscles and Tendons   The visualized flexor and extensor tendons appear intact.   Soft tissues   There is mild edema and swelling within the subcutaneous fat of the fourth and fifth toes.   IMPRESSION:: IMPRESSION: 1. Moderate flexion positioning of second through fourth PIP joints. No definite capsule tear is identified with attention to the second metatarsophalangeal joint. There are mild first and second metatarsophalangeal joint effusions. 2. 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 Diffuse marrow edema throughout the entire intermediate cuneiform may be secondary to stress related and/or degenerative changes, likely a source of pain within the midfoot.   PATIENT SURVEYS:  FOTO 62/100 with target of 70   COGNITION:           Overall cognitive status: Within functional limits for tasks assessed                          SENSATION: WFL   EDEMA: No swelling noted over left foot        POSTURE: No Significant postural limitations   PALPATION: Ventral surface of left foot on toe pad    LOWER EXTREMITY ROM: 2nd toe on left foot PROM 5 deg ext   GAIT: Distance walked: 40  ft  Assistive device utilized: None Level of assistance: Complete Independence Comments: Supination with lateral wearing of shoe tread        TODAY'S TREATMENT:  05/24/22:  THEREX  Exercises taken: [email or web address] Standing Toe Stretches on LLE 5 sec holds x 10  Double Limb Heel Raises with BUE support 1 x 10  Single Limb Heel Raises with BUE support  1 x 10  Towel Crunches in Standing 3 x 10   MANUAL    05/21/22:   THEREX:   Long Sitting Toe Ext Stretch 3 x 30 sec  Long Sitting Toe Ext and Flex AROM 3 x 10 Full Kneel Toe Ext 3 x 30 sec  Calf Stretch 3 x 30 sec              MANUAL                           Transmetatarsal mobilizations             2nd digit traction with flexion and extension mobilizations              Myofacial release  of plantar surface of left foot              Myofacial release of calf                        Transmetatarsal mobilizations             2nd digit traction with flexion and extension mobilizations              Myofacial release of plantar surface of left foot              Myofacial release of calf   Initial:  Left Knee Flexion with dorsiflexion      PATIENT EDUCATION:  Education details: form and technique for appropriate exercise  Person educated: Patient Education method: Explanation and Demonstration Education comprehension: verbalized understanding and returned demonstration     HOME EXERCISE PROGRAM: Access Code: [email or web address] URL: [email or web address] Date: 05/24/2022 Prepared by: Bradly Chris  Exercises - Long Sitting Calf Stretch with Strap  - 1 x daily - 7 x weekly - 3 reps - 60 hold - Towel Scrunches  - 1 x daily - 7 x weekly - 3 sets - 10 reps - Single Leg Heel Raise with Chair Support  - 1 x daily - 3 x weekly - 3 sets - 10 reps -Full Kneel Toe Ext 3 x 30 sec x 7 days per week     ASSESSMENT:   CLINICAL IMPRESSION:  Pt able to perform most exercises and tolerate  mobilizations with minimal pain response. 2nd digit did exhibit increased flexion as evidenced by DIP touching floor in standing. Informed pt of timeline being 6 to 8 weeks. 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     OBJECTIVE IMPAIRMENTS pain, abnormal gait, and decreased ROM.    ACTIVITY LIMITATIONS locomotion level and walking long distances    PARTICIPATION LIMITATIONS: community activity and occupation   PERSONAL FACTORS Time since onset of injury/illness/exacerbation are also affecting patient's functional outcome.    REHAB POTENTIAL: Good   CLINICAL DECISION MAKING: Stable/uncomplicated   EVALUATION COMPLEXITY: Low     GOALS: Goals reviewed with patient? No   SHORT TERM GOALS: Target date: 05/31/2022  Pt will be independent with HEP in order to improve strength and balance in order to decrease fall risk and improve function at home and work. Baseline: NT  Goal status: ONGOING    2.  Patient will demonstrate knowledge of self mobilizations by accurately performing self mobilizations with no cueing for improved outcomes for 2nd digit ROM.  Baseline: NT  Goal status: ONGOING      LONG TERM GOALS: Target date: 07/26/2022    Patient will have improved function and activity level as evidenced by an increase in FOTO score by 10 points or more.  Baseline: 62/100 Goal status: ONGOING    2.  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  Baseline: NPS 3/10  Goal status: ONGOING          PLAN: PT FREQUENCY: 1-2x/week   PT DURATION: 10 weeks   PLANNED INTERVENTIONS: Therapeutic exercises, Therapeutic activity, Neuromuscular re-education, Balance training, Gait training, Patient/Family education, Self Care, Joint  mobilization, Joint manipulation,  Stair training, Orthotic/Fit training, Cryotherapy, Moist heat, scar mobilization, Manual therapy, and Re-evaluation   PLAN FOR NEXT SESSION: Trial patient wearing orthotics with boots, eversion strengthening to attempt to correct supination, manual therapy with joint mobilizations   Bradly Chris PT, DPT  05/24/2022, 11:58 AM

## 2022-05-24 NOTE — Progress Notes (Signed)
Ryan Ritter presents today for follow-up of his second metatarsal osteotomy of his left foot.  Date of surgery March 02, 2022.  He states that he seems to be progressing well though he still feels considerable amount of thickness under the forefoot pad.  He also states that he went to physical therapy was evaluated and has been through his first 2 treatments.  He is going to be delayed returning to work due to physical therapy.  He is going to need a note saying that while he is not going to be returning on September 2 as previously noted which is due to pain and restriction of range of motion of the surgical joint left foot which requires 2 months of physical therapy.  If this is the case then he should be able to return to work on or around October 23.  He will see me October 16 prior to returning to work.  He most likely will have to be on short-term disability.  He cannot perform his full-time duties without restrictions at this time secondary to his surgical foot.  Objective: Vital signs are stable he is alert and oriented x3.  He does have good range of motion of the second metatarsophalangeal joint though there is some dorsiflexion at that level.  He still has some fullness and thickness underneath the second metatarsal phalangeal joint.  The scar tissue appears to be lessening.  Assessment well-healing surgical foot.  Is just going to need more time due to scar tissue that physical therapy is going to have to help decrease.  Plan: Discussed etiology and conservative therapies continue physical therapy we will follow-up with me after 8 weeks of physical therapy or at any point where he feels that he is able to return to work without restrictions.  We will provide Ryan Ritter with this paperwork for work restrictions and short-term disability.

## 2022-05-25 ENCOUNTER — Encounter: Payer: Self-pay | Admitting: Family Medicine

## 2022-05-25 LAB — HEMOGLOBIN A1C: Hemoglobin A1C: 5.6

## 2022-05-25 NOTE — Progress Notes (Unsigned)
Name: Ryan Ritter   MRN: [id number]    DOB: 11-27-1965   Date:05/28/2022       Progress Note  Subjective  Chief Complaint  Follow Up  HPI  DMII: since March he has a CGM  his glucose is going down , his 90 day average A1C was 6.6 %  . He is now seeing Dr. Eddie Dibbles and off Tyler Aas, taking novalog in am's and sometimes in the evening, and Metformin 1500 mg daily, he tried Ozempic but could not tolerate medication . He is eliminated carbs and is frustrated because glucose still spikes at times. He could not tolerate Rosuvastatin - affected his memory. He eats once or twice a day   OSA: he is using CPAP machine,  he does not snore when he uses the CPAP, he still feels tired when he wakes up, however he does not go to bed early, he only sleeps on average 6 hours per night   Obesity: he lost some weight since last visit, he is following a healthy diet, also took ozempic for 2 months, but is off medication now    Thrombocytopenia: reviewed previous labs with him, it has been hovering in 25's  Paternal grandfather had Multiple Myeloma and Maternal grandfather had leukemia. 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 Milinda Pointer   METATARSAL OSTEOTOMY Left 03/02/2022   2nd Left - Dr. Milinda Pointer   tendinitis Left    TRIGGER FINGER RELEASE     vascectomy  08/2014    Family History  Problem Relation Age of Onset   Breast cancer Mother    Colon polyps Mother    Diabetes Father    Pancreatic cancer Father    Diabetes Brother    Diabetes Maternal Grandmother    Cancer Maternal Grandfather        leukemia   Cancer Paternal Grandfather        multiple myeloma    Social History   Tobacco Use   Smoking status: Never   Smokeless tobacco: Former    Types: Chew    Quit date: 10/01/1988  Substance Use Topics   Alcohol use: No    Alcohol/week: 0.0 standard drinks of alcohol     Current Outpatient Medications:    Accu-Chek Softclix Lancets lancets, SMARTSIG:2 Topical Twice Daily, Disp: , Rfl:    Boron 3 MG CAPS, Take 1 capsule by mouth 3 (three) times daily., Disp: , Rfl:    Chromium 200 MCG CAPS, Take 200 mcg by mouth 2 (two) times daily. , Disp: , Rfl:    Cinnamon 500 MG TABS, Take 1,000 tablets by mouth 2 (two) times daily., Disp: , Rfl:    Cod Liver Oil 5000-500  UNIT/5ML OIL, Take by mouth., Disp: , Rfl:    Coenzyme Q10 (CO Q10) 200 MG CAPS, Take 200 mg by mouth daily., Disp: , Rfl:    Continuous Blood Gluc Sensor ([place]) MISC, 1 each by Does not apply route as directed. Apply one every 10 days, Disp: 9 each, Rfl: 1   EPINEPHrine 0.3 mg/0.3 mL IJ SOAJ injection, epinephrine 0.3 mg/0.3 mL injection, auto-injector, Disp: , Rfl:    fluticasone (FLONASE) 50 MCG/ACT nasal spray, 2 sprays in each nostril Once a day Nasally 30 days, Disp: , Rfl:    glucose blood (ACCU-CHEK GUIDE) test strip, USE TO TEST UP TO 4 TIMES A DAY, Disp: 100 strip, Rfl: 2   Insulin Pen Needle 32G X 4 MM MISC, 1 each by Does not apply route daily at 12 noon. Twice daily prn, Disp: 100 each, Rfl: 2   levocetirizine (XYZAL) 5 MG tablet, Take 5 mg by mouth every evening. , Disp: , Rfl:    metFORMIN  (GLUCOPHAGE-XR) 750 MG 24 hr tablet, Take 1,500 mg by mouth daily with breakfast., Disp: , Rfl:    NOVOLOG FLEXPEN 100 UNIT/ML FlexPen, Inject 6-14 Units into the skin 2 (two) times daily as needed., Disp: , Rfl:   Allergies  Allergen Reactions   Benicar [Olmesartan] Other (See Comments)   Lisinopril Other (See Comments)    I personally reviewed active problem list, medication list, allergies, family history, social history, health maintenance with the patient/caregiver today.   ROS  Ten systems reviewed and is negative except as mentioned in HPI   Objective  Vitals:   05/28/22 1531  BP: 128/72  Pulse: 65  Resp: 18  Temp: 98.6 F (37 C)  TempSrc: Oral  SpO2: 98%  Weight: 234 lb 8 oz (106.4 kg)  Height: 6' (1.829 m)    Body mass index is 31.8 kg/m.  Physical Exam  Constitutional: Patient appears well-developed and well-nourished. Obese  No distress.  HEENT: head atraumatic, normocephalic, pupils equal and reactive to light, neck supple Cardiovascular: Normal rate, regular rhythm and normal heart sounds.  No murmur heard. No BLE edema. Pulmonary/Chest: Effort normal and breath sounds normal. No respiratory distress. Abdominal: Soft.  There is no tenderness. Psychiatric: Patient has a normal mood and affect. behavior is normal. Judgment and thought content normal.   Recent Results (from the past 2160 hour(s))  HM DIABETES EYE EXAM     Status: None   Collection Time: 05/18/22 12:00 AM  Result Value Ref Range   HM Diabetic Eye Exam No Retinopathy No Retinopathy  Hemoglobin A1c     Status: None   Collection Time: 05/25/22 12:00 AM  Result Value Ref Range   Hemoglobin A1C 5.6     Comment: Dr. Eddie Dibbles     PHQ2/9:    01/30/2022    4:07 PM 12/12/2021    3:20 PM 12/05/2021    7:43 AM 06/06/2021    2:53 PM 01/11/2021    2:53 PM  Depression screen PHQ 2/9  Decreased Interest 0 0 0 0 0  Down, Depressed, Hopeless 0 0 0 0 0  PHQ - 2 Score 0 0 0 0 0  Altered sleeping 1 0 0     Tired, decreased energy 1 0 0    Change in appetite 0 0 0    Feeling bad or failure about yourself  0 0 0    Trouble concentrating 0 0 0    Moving slowly or fidgety/restless 0 0 0  Suicidal thoughts 0 0 0    PHQ-9 Score 2 0 0    Difficult doing work/chores Not difficult at all        phq 9 is negative   Fall Risk:    05/28/2022    3:31 PM 01/30/2022    4:07 PM 12/12/2021    3:20 PM 12/05/2021    7:42 AM 06/06/2021    2:53 PM  Fall Risk   Falls in the past year? 0 0 0 0 0  Number falls in past yr:   0 0 0  Injury with Fall?   0 0 0  Risk for fall due to : No Fall Risks No Fall Risks No Fall Risks No Fall Risks No Fall Risks  Follow up Falls prevention discussed Falls prevention discussed Falls prevention discussed Falls prevention discussed Falls prevention discussed      Functional Status Survey: Is the patient deaf or have difficulty hearing?: No Does the patient have difficulty seeing, even when wearing glasses/contacts?: No Does the patient have difficulty concentrating, remembering, or making decisions?: No Does the patient have difficulty walking or climbing stairs?: No Does the patient have difficulty dressing or bathing?: No Does the patient have difficulty doing errands alone such as visiting a doctor's office or shopping?: No    Assessment & Plan  1. Dyslipidemia associated with type 2 diabetes mellitus (H[facility])  Recent A1C done at   2. OSA on CPAP  Complaint   3. Gastroesophageal reflux disease with esophagitis without hemorrhage  Doing well   4. Thrombocytopenia ([place])  - Ambulatory referral to Hematology / Oncology  5. Statin intolerance  Discussed Zetia and Nexlizet   6. Needs flu shot  He will return for glu shot in Oct

## 2022-05-28 ENCOUNTER — Ambulatory Visit: Payer: BC Managed Care – PPO | Admitting: Family Medicine

## 2022-05-28 ENCOUNTER — Encounter: Payer: Self-pay | Admitting: Family Medicine

## 2022-05-28 VITALS — BP 128/72 | HR 65 | Temp 98.6°F | Resp 18 | Ht 72.0 in | Wt 234.5 lb

## 2022-05-28 DIAGNOSIS — G4733 Obstructive sleep apnea (adult) (pediatric): Secondary | ICD-10-CM

## 2022-05-28 DIAGNOSIS — E785 Hyperlipidemia, unspecified: Secondary | ICD-10-CM

## 2022-05-28 DIAGNOSIS — D696 Thrombocytopenia, unspecified: Secondary | ICD-10-CM

## 2022-05-28 DIAGNOSIS — Z9989 Dependence on other enabling machines and devices: Secondary | ICD-10-CM

## 2022-05-28 DIAGNOSIS — Z23 Encounter for immunization: Secondary | ICD-10-CM

## 2022-05-28 DIAGNOSIS — E1169 Type 2 diabetes mellitus with other specified complication: Secondary | ICD-10-CM | POA: Diagnosis not present

## 2022-05-28 DIAGNOSIS — K2100 Gastro-esophageal reflux disease with esophagitis, without bleeding: Secondary | ICD-10-CM

## 2022-05-28 DIAGNOSIS — Z789 Other specified health status: Secondary | ICD-10-CM

## 2022-05-28 NOTE — Patient Instructions (Signed)
Discussed Zeta Nexlizet  They are not statin but works for high cholesterol

## 2022-05-29 ENCOUNTER — Encounter: Payer: Self-pay | Admitting: Family Medicine

## 2022-05-29 ENCOUNTER — Encounter: Payer: Self-pay | Admitting: Podiatry

## 2022-05-30 ENCOUNTER — Encounter: Payer: Self-pay | Admitting: Physical Therapy

## 2022-05-30 ENCOUNTER — Ambulatory Visit: Payer: BC Managed Care – PPO | Admitting: Physical Therapy

## 2022-05-30 DIAGNOSIS — M79675 Pain in left toe(s): Secondary | ICD-10-CM | POA: Diagnosis not present

## 2022-05-30 DIAGNOSIS — M79672 Pain in left foot: Secondary | ICD-10-CM

## 2022-05-30 NOTE — Therapy (Signed)
OUTPATIENT PHYSICAL THERAPY TREATMENT NOTE   Patient Name: Ryan Ritter MRN: [id number] DOB:Apr 16, 1966, 56 y.o., male Today's Date: 05/30/2022  PCP: Dr. Steele Sizer  REFERRING PROVIDER: Dr. Tyson Dense   END OF SESSION:   PT End of Session - 05/30/22 1643     Visit Number 4    Number of Visits 10    Date for PT Re-Evaluation 07/26/22    Authorization Type BCBS    PT Start Time 1635    PT Stop Time 9937    PT Time Calculation (min) 40 min    Activity Tolerance Patient tolerated treatment well    Behavior During Therapy [facility] for tasks assessed/performed             Past Medical History:  Diagnosis Date   Allergy    Benign essential HTN 01/29/2007   Diabetes mellitus without complication ([place])    Hyperlipidemia    Mass of left thigh 10/09/2018   Obesity 03/14/2015   OSA on CPAP 05/29/2017   Plantar fasciitis 09/30/2015   Sleep apnea    Past Surgical History:  Procedure Laterality Date   ANAL FISSURE REPAIR     HAMMER TOE SURGERY Left 03/02/2022   2nd Left - Dr. Milinda Pointer   METATARSAL OSTEOTOMY Left 03/02/2022   2nd Left - Dr. 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   Vitiligo 09/09/2018   OSA on CPAP 05/29/2017   Low HDL (under 40) 11/08/2016   Spondylolysis of cervical region 08/08/2016   Family history of colonic polyps 03/14/2015   Gastroesophageal reflux disease with esophagitis without hemorrhage 03/14/2015   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 He has had ongoing pain in ball of foot along with pain at the top of 2nd met due to toe rubbing against the top of shoe because it is stuck in extension. He has tried self manipulations with no increased in toe flexion. 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 He has orthotics that he wears with his work boots, but he has not attempted wearing boots since having the procedure because he is afraid of the pain.   PRECAUTIONS: None   SUBJECTIVE: Pt reports increased pain in the left to especially when stretching the left foot.   PAIN:  Are you having pain? Yes: NPRS scale: 2/10 Pain location: Pain on the top of the 2nd digit. Pain description: Soreness  Aggravating factors: Wearing tight shoes or walking on foot Relieving factors: Sitting still    OBJECTIVE: (objective measures completed at initial evaluation unless otherwise dated)  DIAGNOSTIC FINDINGS: CLINICAL DATA:  Foot trauma. Tendon injury  or dislocation suspected. Evaluate hammertoe of the second digit with possible capsule tear of the second metacarpophalangeal joint. Surgical consideration.   EXAM: MRI OF THE LEFT FOOT WITHOUT CONTRAST   TECHNIQUE: Multiplanar, multisequence MR imaging of the left forefoot was performed. No intravenous contrast was administered.   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 Previously there was predominantly degenerative cystic change in this region, now with interval increase in surrounding edema (sagittal series 8 images 16 through 21). This edema is high-grade throughout the entire intermediate cuneiform (sagittal image 18).   Small great toe metatarsophalangeal joint and second metatarsophalangeal joint effusions. The plantar plates appear intact. No definite capsular tear is seen. There is moderate flexion positioning of the second through fourth PIP joints.   Mild medial and lateral great toe metatarsophalangeal joint sesamoid marrow edema likely degenerative change.   There appears to be moderate cartilage thinning and mild peripheral degenerative osteophytosis of the tarsometatarsal joints diffusely.   Ligaments   Lisfranc ligament complex appears intact.   Muscles and Tendons   The visualized flexor and extensor tendons appear intact.   Soft tissues   There is mild edema and swelling within the subcutaneous fat of the fourth and fifth toes.   IMPRESSION:: IMPRESSION: 1. Moderate flexion positioning of second through fourth PIP joints. No definite capsule tear is identified with attention to the second metatarsophalangeal joint. There are mild first and second metatarsophalangeal joint effusions. 2. 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 Diffuse marrow edema throughout the entire intermediate cuneiform may be secondary to stress related and/or degenerative changes, likely a source of pain within the midfoot.   PATIENT SURVEYS:  FOTO 62/100 with target of 70   COGNITION:           Overall cognitive status: Within functional limits for tasks assessed                          SENSATION: WFL   EDEMA: No swelling noted over left foot        POSTURE: No Significant postural limitations   PALPATION: Ventral surface of left foot on toe pad    LOWER  EXTREMITY ROM: 2nd toe on left foot PROM 5 deg ext   GAIT: Distance walked: 40 ft  Assistive device utilized: None Level of assistance: Complete Independence Comments: Supination with lateral wearing of shoe tread        TODAY'S TREATMENT:  05/30/22:  THEREX  Self-massage with lacrosse ball on  ball of foot on left foot  Self-massage with lacrosse ball on medial longitudinal arch of left foot   MANUAL   Transmetatarsal mobilizations             2nd digit traction with flexion and extension mobilizations              Myofacial release of plantar surface of left foot              Myofacial release of calf                        Transmetatarsal mobilizations             2nd digit traction with flexion and extension mobilizations  Myofacial release of plantar surface of left foot              Myofacial release of calf   05/24/22:  THEREX  Exercises taken: [email or web address] Standing Toe Stretches on LLE 5 sec holds x 10  Double Limb Heel Raises with BUE support 1 x 10  Single Limb Heel Raises with BUE support  1 x 10  Towel Crunches in Standing 3 x 10   MANUAL   Transmetatarsal mobilizations             2nd digit traction with flexion and extension mobilizations              Myofacial release of plantar surface of left foot              Myofacial release of calf                        Transmetatarsal mobilizations             2nd digit traction with flexion and extension mobilizations              Myofacial release of plantar surface of left foot              Myofacial release of calf    05/21/22:   THEREX:   Long Sitting Toe Ext Stretch 3 x 30 sec  Long Sitting Toe Ext and Flex AROM 3 x 10 Full Kneel Toe Ext 3 x 30 sec  Calf Stretch 3 x 30 sec              MANUAL                           Transmetatarsal mobilizations             2nd digit traction with flexion and extension mobilizations              Myofacial release  of plantar surface of left foot              Myofacial release of calf                        Transmetatarsal mobilizations             2nd digit traction with flexion and extension mobilizations              Myofacial release of plantar surface of left foot              Myofacial release of calf     PATIENT EDUCATION:  Education details: form and technique for appropriate exercise  Person educated: Patient Education method: Explanation and Demonstration Education comprehension: verbalized understanding and returned demonstration     HOME EXERCISE PROGRAM: Access Code: [email or web address] URL: [email or web address] Date: 05/24/2022 Prepared by: Bradly Chris  Exercises - Long Sitting Calf Stretch with Strap  - 1 x daily - 7 x weekly - 3 reps - 60 hold - Towel Scrunches  - 1 x daily - 7 x weekly - 3 sets - 10 reps - Single Leg Heel Raise with Chair Support  - 1 x daily - 3 x weekly - 3 sets - 10 reps -Full Kneel Toe Ext 3 x 30 sec x 7 days per week     ASSESSMENT:   CLINICAL IMPRESSION:  Pt able to perform mobilizations with minimal pain response. 2nd digit did  exhibit increased flexion as evidenced by DIP touching floor in standing. 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     OBJECTIVE IMPAIRMENTS pain, abnormal gait, and decreased ROM.    ACTIVITY LIMITATIONS locomotion level and walking long distances    PARTICIPATION LIMITATIONS: community activity and occupation   PERSONAL FACTORS Time since onset of injury/illness/exacerbation are also affecting patient's functional outcome.    REHAB POTENTIAL: Good   CLINICAL DECISION MAKING: Stable/uncomplicated   EVALUATION COMPLEXITY: Low     GOALS: Goals reviewed with patient? No   SHORT TERM GOALS: Target date: 05/31/2022  Pt will be independent  with HEP in order to improve strength and balance in order to decrease fall risk and improve function at home and work. Baseline: NT  Goal status: ONGOING    2.  Patient will demonstrate knowledge of self mobilizations by accurately performing self mobilizations with no cueing for improved outcomes for 2nd digit ROM.  Baseline: NT  Goal status: ONGOING      LONG TERM GOALS: Target date: 07/26/2022    Patient will have improved function and activity level as evidenced by an increase in FOTO score by 10 points or more.  Baseline: 62/100 Goal status: ONGOING    2.  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  Baseline: NPS 3/10  Goal status: ONGOING          PLAN: PT FREQUENCY: 1-2x/week   PT DURATION: 10 weeks   PLANNED INTERVENTIONS: Therapeutic exercises, Therapeutic activity, Neuromuscular re-education, Balance training, Gait training, Patient/Family education, Self Care, Joint mobilization, Joint manipulation, Stair training, Orthotic/Fit training, Cryotherapy, Moist heat, scar mobilization, Manual therapy, and Re-evaluation   PLAN FOR NEXT SESSION: Trial patient wearing orthotics with boots, eversion strengthening to attempt to correct supination, manual therapy with joint mobilizations   Bradly Chris PT, DPT  05/30/2022, 4:46 PM

## 2022-05-31 ENCOUNTER — Encounter: Payer: Self-pay | Admitting: Family Medicine

## 2022-06-01 ENCOUNTER — Inpatient Hospital Stay: Payer: BC Managed Care – PPO

## 2022-06-01 ENCOUNTER — Inpatient Hospital Stay: Payer: BC Managed Care – PPO | Attending: Oncology | Admitting: Oncology

## 2022-06-01 ENCOUNTER — Encounter: Payer: Self-pay | Admitting: Oncology

## 2022-06-01 VITALS — BP 127/65 | HR 53 | Temp 97.3°F | Resp 18 | Wt 233.6 lb

## 2022-06-01 DIAGNOSIS — E785 Hyperlipidemia, unspecified: Secondary | ICD-10-CM | POA: Insufficient documentation

## 2022-06-01 DIAGNOSIS — D696 Thrombocytopenia, unspecified: Secondary | ICD-10-CM | POA: Diagnosis present

## 2022-06-01 DIAGNOSIS — Z7984 Long term (current) use of oral hypoglycemic drugs: Secondary | ICD-10-CM | POA: Diagnosis not present

## 2022-06-01 DIAGNOSIS — Z794 Long term (current) use of insulin: Secondary | ICD-10-CM | POA: Diagnosis not present

## 2022-06-01 DIAGNOSIS — Z79899 Other long term (current) drug therapy: Secondary | ICD-10-CM | POA: Insufficient documentation

## 2022-06-01 DIAGNOSIS — D72810 Lymphocytopenia: Secondary | ICD-10-CM

## 2022-06-01 DIAGNOSIS — I10 Essential (primary) hypertension: Secondary | ICD-10-CM | POA: Diagnosis not present

## 2022-06-01 DIAGNOSIS — G4733 Obstructive sleep apnea (adult) (pediatric): Secondary | ICD-10-CM | POA: Diagnosis not present

## 2022-06-01 DIAGNOSIS — E669 Obesity, unspecified: Secondary | ICD-10-CM | POA: Insufficient documentation

## 2022-06-01 DIAGNOSIS — E119 Type 2 diabetes mellitus without complications: Secondary | ICD-10-CM | POA: Insufficient documentation

## 2022-06-01 LAB — CBC WITH DIFFERENTIAL/PLATELET
Abs Immature Granulocytes: 0.02 10*3/uL (ref 0.00–0.07)
Basophils Absolute: 0 10*3/uL (ref 0.0–0.1)
Basophils Relative: 1 %
Eosinophils Absolute: 0.1 10*3/uL (ref 0.0–0.5)
Eosinophils Relative: 3 %
HCT: 41.8 % (ref 39.0–52.0)
Hemoglobin: 14.5 g/dL (ref 13.0–17.0)
Immature Granulocytes: 1 %
Lymphocytes Relative: 17 %
Lymphs Abs: 0.7 10*3/uL (ref 0.7–4.0)
MCH: 30.8 pg (ref 26.0–34.0)
MCHC: 34.7 g/dL (ref 30.0–36.0)
MCV: 88.7 fL (ref 80.0–100.0)
Monocytes Absolute: 0.4 10*3/uL (ref 0.1–1.0)
Monocytes Relative: 9 %
Neutro Abs: 2.9 10*3/uL (ref 1.7–7.7)
Neutrophils Relative %: 69 %
Platelets: 143 10*3/uL — ABNORMAL LOW (ref 150–400)
RBC: 4.71 MIL/uL (ref 4.22–5.81)
RDW: 12.6 % (ref 11.5–15.5)
WBC: 4.2 10*3/uL (ref 4.0–10.5)
nRBC: 0 % (ref 0.0–0.2)

## 2022-06-01 LAB — FOLATE: Folate: 12.6 ng/mL (ref >5.9)

## 2022-06-01 LAB — HEPATITIS PANEL, ACUTE
HCV Ab: NONREACTIVE
Hep A IgM: NONREACTIVE
Hep B C IgM: NONREACTIVE
Hepatitis B Surface Ag: NONREACTIVE

## 2022-06-01 LAB — TECHNOLOGIST SMEAR REVIEW: Plt Morphology: ADEQUATE

## 2022-06-01 LAB — LACTATE DEHYDROGENASE: LDH: 115 U/L (ref 98–192)

## 2022-06-01 LAB — HIV ANTIBODY (ROUTINE TESTING W REFLEX): HIV Screen 4th Generation wRfx: NONREACTIVE

## 2022-06-01 LAB — IMMATURE PLATELET FRACTION: Immature Platelet Fraction: 4.5 % (ref 1.2–8.6)

## 2022-06-01 LAB — VITAMIN B12: Vitamin B-12: 375 pg/mL (ref 180–914)

## 2022-06-01 NOTE — Progress Notes (Signed)
Hematology/Oncology Consult Note [phone] [phone]    Patient Care Team: Steele Sizer, MD as PCP - General (Family Medicine) Mosetta Anis, MD as Referring Physician (Allergy)  REFERRING PROVIDER: Steele Sizer, MD  CHIEF COMPLAINTS/REASON FOR VISIT:  Evaluation of thrombocytopenia  HISTORY OF PRESENTING ILLNESS:  Ryan JAROSZEWSKI is a 56 y.o. male who was seen in consultation at the request of Steele Sizer, MD for evaluation of thrombocytopenia   12/05/2021 patient had abnormal Labs which showed decreased platelet counts 131,000.  Reviewed patient's previous labs. Thrombocytopenia is chronic chronic onset , since at least 6 years ago Stable reticulocyte count 130,000s range Denies weight loss, fever, chills, fatigue, occasionally he has night sweats.  Denies hematochezia, hematuria, hematemesis, epistaxis, black tarry stool.  Patient has no easy bruising.   Patient denies any alcohol use He works as a Surveyor, minerals.    MEDICAL HISTORY:  Past Medical History:  Diagnosis Date   Allergy    Benign essential HTN 01/29/2007   Diabetes mellitus without complication ([place])    Hyperlipidemia    Mass of left thigh 10/09/2018   Obesity 03/14/2015   OSA on CPAP 05/29/2017   Plantar fasciitis 09/30/2015   Sleep apnea     SURGICAL HISTORY: Past Surgical History:  Procedure Laterality Date   ANAL FISSURE REPAIR     HAMMER TOE SURGERY Left 03/02/2022   2nd Left - Dr. Milinda Pointer   METATARSAL OSTEOTOMY Left 03/02/2022   2nd Left - Dr. Milinda Pointer   tendinitis Left    TRIGGER FINGER RELEASE     vascectomy  08/2014    SOCIAL HISTORY: Social History   Socioeconomic History   Marital status: Married    Spouse name: Not on file   Number of children: Not on file   Years of education: Not on file   Highest education level: Not on file  Occupational History   Not on file  Tobacco Use   Smoking status: Never   Smokeless tobacco: Former    Types: Chew    Quit  date: 10/01/1988  Vaping Use   Vaping Use: Never used  Substance and Sexual Activity   Alcohol use: No    Alcohol/week: 0.0 standard drinks of alcohol   Drug use: No   Sexual activity: Yes    Partners: Female  Other Topics Concern   Not on file  Social History Narrative   Not on file   Social Determinants of Health   Financial Resource Strain: Low Risk  (12/12/2021)   Overall Financial Resource Strain (CARDIA)    Difficulty of Paying Living Expenses: Not hard at all  Food Insecurity: No Food Insecurity (12/12/2021)   Hunger Vital Sign    Worried About Running Out of Food in the Last Year: Never true    Tea in the Last Year: Never true  Transportation Needs: No Transportation Needs (12/12/2021)   PRAPARE - Hydrologist (Medical): No    Lack of Transportation (Non-Medical): No  Physical Activity: Insufficiently Active (12/12/2021)   Exercise Vital Sign    Days of Exercise per Week: 7 days    Minutes of Exercise per Session: 20 min  Stress: Stress Concern Present (12/12/2021)   [place]    Feeling of Stress : To some extent  Social Connections: Socially Integrated (12/12/2021)   Social Connection and Isolation Panel [NHANES]    Frequency of Communication with Friends and Family: More than three  times a week    Frequency of Social Gatherings with Friends and Family: Once a week    Attends Religious Services: More than 4 times per year    Active Member of [employer] or Organizations: Yes    Attends Music therapist: More than 4 times per year    Marital Status: Married  Human resources officer Violence: Not At Risk (12/12/2021)   Humiliation, Afraid, Rape, and Kick questionnaire    Fear of Current or Ex-Partner: No    Emotionally Abused: No    Physically Abused: No    Sexually Abused: No    FAMILY HISTORY: Family History  Problem Relation Age of Onset   Breast cancer  Mother    Colon polyps Mother    Diabetes Father    Pancreatic cancer Father    Diabetes Brother    Diabetes Maternal Grandmother    Cancer Maternal Grandfather        leukemia   Leukemia Maternal Grandfather    Cancer Paternal Grandfather        multiple myeloma   Multiple myeloma Paternal Grandfather     ALLERGIES:  is allergic to benicar [olmesartan] and lisinopril.  MEDICATIONS:  Current Outpatient Medications  Medication Sig Dispense Refill   Accu-Chek Softclix Lancets lancets SMARTSIG:2 Topical Twice Daily     Boron 3 MG CAPS Take 1 capsule by mouth 3 (three) times daily.     Chromium 200 MCG CAPS Take 200 mcg by mouth 2 (two) times daily.      Cinnamon 500 MG TABS Take 1,000 tablets by mouth 2 (two) times daily.     Cod Liver Oil 5000-500 UNIT/5ML OIL Take by mouth.     Coenzyme Q10 (CO Q10) 200 MG CAPS Take 200 mg by mouth daily.     Continuous Blood Gluc Sensor (DEXCOM G7 SENSOR) MISC 1 each by Does not apply route as directed. Apply one every 10 days 9 each 1   fluticasone (FLONASE) 50 MCG/ACT nasal spray 2 sprays in each nostril Once a day Nasally 30 days     glucose blood (ACCU-CHEK GUIDE) test strip USE TO TEST UP TO 4 TIMES A DAY 100 strip 2   Insulin Pen Needle 32G X 4 MM MISC 1 each by Does not apply route daily at 12 noon. Twice daily prn 100 each 2   levocetirizine (XYZAL) 5 MG tablet Take 5 mg by mouth every evening.      metFORMIN (GLUCOPHAGE-XR) 750 MG 24 hr tablet Take 1,500 mg by mouth daily with breakfast.     NOVOLOG FLEXPEN 100 UNIT/ML FlexPen Inject 6-14 Units into the skin 2 (two) times daily as needed.     EPINEPHrine 0.3 mg/0.3 mL IJ SOAJ injection epinephrine 0.3 mg/0.3 mL injection, auto-injector (Patient not taking: Reported on 06/01/2022)     No current facility-administered medications for this visit.    Review of Systems  Constitutional:  Negative for appetite change, chills, fatigue, fever and unexpected weight change.  HENT:   Negative for  hearing loss and voice change.   Eyes:  Negative for eye problems and icterus.  Respiratory:  Negative for chest tightness, cough and shortness of breath.   Cardiovascular:  Negative for chest pain and leg swelling.  Gastrointestinal:  Negative for abdominal distention and abdominal pain.  Endocrine: Negative for hot flashes.  Genitourinary:  Negative for difficulty urinating, dysuria and frequency.   Musculoskeletal:  Negative for arthralgias.  Skin:  Negative for itching and rash.  Neurological:  Negative for light-headedness and numbness.  Hematological:  Negative for adenopathy. Does not bruise/bleed easily.  Psychiatric/Behavioral:  Negative for confusion.     PHYSICAL EXAMINATION: ECOG PERFORMANCE STATUS: 0 - Asymptomatic Vitals:   06/01/22 0928  BP: 127/65  Pulse: (!) 53  Resp: 18  Temp: (!) 97.3 F (36.3 C)   Filed Weights   06/01/22 0928  Weight: 233 lb 9.6 oz (106 kg)    Physical Exam Constitutional:      General: He is not in acute distress. HENT:     Head: Normocephalic and atraumatic.  Eyes:     General: No scleral icterus. Cardiovascular:     Rate and Rhythm: Normal rate and regular rhythm.     Heart sounds: Normal heart sounds.  Pulmonary:     Effort: Pulmonary effort is normal. No respiratory distress.     Breath sounds: No wheezing.  Abdominal:     General: Bowel sounds are normal. There is no distension.     Palpations: Abdomen is soft.  Musculoskeletal:        General: No deformity. Normal range of motion.     Cervical back: Normal range of motion and neck supple.  Skin:    General: Skin is warm and dry.     Findings: No erythema or rash.  Neurological:     Mental Status: He is alert and oriented to person, place, and time. Mental status is at baseline.     Cranial Nerves: No cranial nerve deficit.  Psychiatric:        Mood and Affect: Mood normal.      LABORATORY DATA:  I have reviewed the data as listed    Latest Ref Rng & Units  06/01/2022   10:18 AM 12/05/2021    8:30 AM 01/12/2021   11:23 AM  CBC  WBC 4.0 - 10.5 K/uL 4.2  4.0  6.3   Hemoglobin 13.0 - 17.0 g/dL 14.5  14.7  14.7   Hematocrit 39.0 - 52.0 % 41.8  43.8  44.3   Platelets 150 - 400 K/uL 143  131  139       Latest Ref Rng & Units 12/05/2021    8:30 AM 01/12/2021   11:23 AM 07/26/2020    8:19 AM  CMP  Glucose 65 - 99 mg/dL 334  192  170   BUN 7 - 25 mg/dL 19  17  26    Creatinine 0.70 - 1.30 mg/dL 0.86  0.85  0.84   Sodium 135 - 146 mmol/L 135  138  140   Potassium 3.5 - 5.3 mmol/L 4.6  4.4  4.6   Chloride 98 - 110 mmol/L 102  103  105   CO2 20 - 32 mmol/L 24  28  26    Calcium 8.6 - 10.3 mg/dL 8.9  9.1  9.8   Total Protein 6.1 - 8.1 g/dL 6.5  6.6    Total Bilirubin 0.2 - 1.2 mg/dL 0.6  1.0    AST 10 - 35 U/L 15  13    ALT 9 - 46 U/L 23  21       RADIOGRAPHIC STUDIES: I have personally reviewed the radiological images as listed and agreed with the findings in the report.  DG Foot Complete Left  Result Date: 05/09/2022 Please see detailed radiograph report in office note.  DG Foot Complete Left  Result Date: 04/16/2022 Please see detailed radiograph report in office note.  DG Foot Complete Left  Result Date: 03/28/2022 Please see  detailed radiograph report in office note.  DG Foot Complete Left  Result Date: 03/07/2022 Please see detailed radiograph report in office note.    ASSESSMENT & PLAN:   Thrombocytopenia ([place]) Previous labs reviewed and discussed with patient. Chronic thrombocytopenia, levels were above 100,000. Check CBC, smear, vitamin B12, folate, immature platelet fraction, HIV, hepatitis panel, LDH, peripheral blood flow cytometry, multiple myeloma panel, light chain ratio.   Borderline low vitamin B12 level, recommend patient to start oral vitamin B12 supplementation  # Patient follow-up with me in approximately 3-4 weeks to review the above results.   Orders Placed This Encounter  Procedures   Vitamin B12     Standing Status:   Future    Number of Occurrences:   1    Standing Expiration Date:   06/02/2023   Folate    Standing Status:   Future    Number of Occurrences:   1    Standing Expiration Date:   06/02/2023   CBC with Differential/Platelet    Standing Status:   Future    Number of Occurrences:   1    Standing Expiration Date:   06/02/2023   Immature Platelet Fraction    Standing Status:   Future    Number of Occurrences:   1    Standing Expiration Date:   06/02/2023   HIV Antibody (routine testing w rflx)    Standing Status:   Future    Number of Occurrences:   1    Standing Expiration Date:   06/02/2023   Hepatitis panel, acute    Standing Status:   Future    Number of Occurrences:   1    Standing Expiration Date:   06/02/2023   Lactate dehydrogenase    Standing Status:   Future    Number of Occurrences:   1    Standing Expiration Date:   06/02/2023   Flow cytometry panel-leukemia/lymphoma work-up    Standing Status:   Future    Number of Occurrences:   1    Standing Expiration Date:   06/02/2023   Kappa/lambda light chains    Standing Status:   Future    Number of Occurrences:   1    Standing Expiration Date:   06/02/2023   Multiple Myeloma Panel (SPEP&IFE w/QIG)    Standing Status:   Future    Number of Occurrences:   1    Standing Expiration Date:   06/02/2023   Technologist smear review    Order Specific Question:   Clinical information:    Answer:   thrombocytopenia, lymphocytopenia    All questions were answered. The patient knows to call the clinic with any problems questions or concerns.  Cc Steele Sizer, MD  Thank you for this kind referral and the opportunity to participate in the care of this patient. A copy of today's note is routed to referring provider    Earlie Server, MD, PhD 06/01/2022

## 2022-06-01 NOTE — Assessment & Plan Note (Signed)
Previous labs reviewed and discussed with patient. Chronic thrombocytopenia, levels were above 100,000. Check CBC, smear, vitamin B12, folate, immature platelet fraction, HIV, hepatitis panel, LDH, peripheral blood flow cytometry, multiple myeloma panel, light chain ratio.

## 2022-06-05 LAB — MULTIPLE MYELOMA PANEL, SERUM
Albumin SerPl Elph-Mcnc: 3.9 g/dL (ref 2.9–4.4)
Albumin/Glob SerPl: 1.7 (ref 0.7–1.7)
Alpha 1: 0.2 g/dL (ref 0.0–0.4)
Alpha2 Glob SerPl Elph-Mcnc: 0.5 g/dL (ref 0.4–1.0)
B-Globulin SerPl Elph-Mcnc: 0.9 g/dL (ref 0.7–1.3)
Gamma Glob SerPl Elph-Mcnc: 0.8 g/dL (ref 0.4–1.8)
Globulin, Total: 2.4 g/dL (ref 2.2–3.9)
IgA: 256 mg/dL (ref 90–386)
IgG (Immunoglobin G), Serum: 798 mg/dL (ref 603–1613)
IgM (Immunoglobulin M), Srm: 33 mg/dL (ref 20–172)
Total Protein ELP: 6.3 g/dL (ref 6.0–8.5)

## 2022-06-05 LAB — COMP PANEL: LEUKEMIA/LYMPHOMA

## 2022-06-05 LAB — KAPPA/LAMBDA LIGHT CHAINS
Kappa free light chain: 14.9 mg/L (ref 3.3–19.4)
Kappa, lambda light chain ratio: 1.24 (ref 0.26–1.65)
Lambda free light chains: 12 mg/L (ref 5.7–26.3)

## 2022-06-06 ENCOUNTER — Ambulatory Visit: Payer: BC Managed Care – PPO | Attending: Podiatry | Admitting: Physical Therapy

## 2022-06-06 ENCOUNTER — Encounter: Payer: Self-pay | Admitting: Physical Therapy

## 2022-06-06 DIAGNOSIS — M79675 Pain in left toe(s): Secondary | ICD-10-CM | POA: Diagnosis present

## 2022-06-06 DIAGNOSIS — M79672 Pain in left foot: Secondary | ICD-10-CM | POA: Insufficient documentation

## 2022-06-06 DIAGNOSIS — M6281 Muscle weakness (generalized): Secondary | ICD-10-CM | POA: Diagnosis present

## 2022-06-06 DIAGNOSIS — R262 Difficulty in walking, not elsewhere classified: Secondary | ICD-10-CM | POA: Diagnosis present

## 2022-06-06 NOTE — Therapy (Signed)
OUTPATIENT PHYSICAL THERAPY TREATMENT NOTE   Patient Name: Ryan Ritter MRN: [id number] DOB:02-05-1966, 56 y.o., male Today's Date: 06/06/2022  PCP: Dr. Steele Sizer  REFERRING PROVIDER: Dr. Tyson Dense   END OF SESSION:   PT End of Session - 06/06/22 2126     Visit Number 5    Number of Visits 10    Date for PT Re-Evaluation 07/26/22    Authorization Type BCBS    PT Start Time 1545    PT Stop Time 1630    PT Time Calculation (min) 45 min    Activity Tolerance Patient tolerated treatment well    Behavior During Therapy [facility] for tasks assessed/performed              Past Medical History:  Diagnosis Date   Allergy    Benign essential HTN 01/29/2007   Diabetes mellitus without complication ([place])    Hyperlipidemia    Mass of left thigh 10/09/2018   Obesity 03/14/2015   OSA on CPAP 05/29/2017   Plantar fasciitis 09/30/2015   Sleep apnea    Past Surgical History:  Procedure Laterality Date   ANAL FISSURE REPAIR     HAMMER TOE SURGERY Left 03/02/2022   2nd Left - Dr. Milinda Pointer   METATARSAL OSTEOTOMY Left 03/02/2022   2nd Left - Dr. 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   Vitiligo 09/09/2018   OSA on CPAP 05/29/2017   Low HDL (under 40) 11/08/2016   Spondylolysis of cervical region 08/08/2016   Family history of colonic polyps 03/14/2015   Gastroesophageal reflux disease with esophagitis without hemorrhage 03/14/2015   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 He has had ongoing pain in ball of foot along with pain at the top of 2nd met due to toe rubbing against the top of shoe because it is stuck in extension. He has tried self manipulations with no increased in toe flexion. 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 He has orthotics that he wears with his work boots, but he has not attempted wearing boots since having the procedure because he is afraid of the pain.   PRECAUTIONS: None   SUBJECTIVE: Pt reports that he is still having slight discomfort along bottom padding of right foot. He describes noticing more of a difference in foot pain and padding sensation after receiving whole foot massage.   PAIN:  Are you having pain? Yes: NPRS scale: 2-3/10 Pain location: Pain on the top of the 2nd digit. Pain description: Soreness  Aggravating factors: Wearing tight shoes or walking on foot Relieving factors: Sitting still  OBJECTIVE: (objective measures completed at initial evaluation unless otherwise dated)  DIAGNOSTIC FINDINGS: CLINICAL DATA:  Foot trauma. Tendon injury or dislocation suspected. Evaluate hammertoe of the second digit with possible capsule tear of the second metacarpophalangeal joint. Surgical consideration.   EXAM: MRI OF THE LEFT FOOT WITHOUT CONTRAST   TECHNIQUE: Multiplanar, multisequence MR imaging of the left forefoot was performed. No intravenous contrast was administered.   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 Previously there was predominantly degenerative cystic change in this region, now with interval increase in surrounding edema (sagittal series 8 images 16 through 21). This edema is high-grade throughout the entire intermediate cuneiform (sagittal image 18).   Small great toe metatarsophalangeal joint and second metatarsophalangeal joint effusions. The plantar plates appear intact. No definite capsular tear is seen. There is moderate flexion positioning of the second through fourth PIP joints.   Mild medial and lateral great toe metatarsophalangeal joint sesamoid marrow edema likely degenerative change.   There appears to be moderate cartilage thinning and mild peripheral degenerative osteophytosis of the tarsometatarsal joints diffusely.   Ligaments   Lisfranc ligament complex appears intact.   Muscles and Tendons   The visualized flexor and extensor tendons appear intact.   Soft tissues   There is mild edema and swelling within the subcutaneous fat of the fourth and fifth toes.   IMPRESSION:: IMPRESSION: 1. Moderate flexion positioning of second through fourth PIP joints. No definite capsule tear is identified with attention to the second metatarsophalangeal joint. There are mild first and second metatarsophalangeal joint effusions. 2. 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 Diffuse marrow edema throughout the entire intermediate cuneiform may be secondary to stress related and/or degenerative changes, likely a source of pain within the midfoot.   PATIENT SURVEYS:  FOTO 62/100 with target of 70   COGNITION:           Overall cognitive status: Within functional limits for tasks assessed                          SENSATION: WFL   EDEMA: No swelling noted over left foot         POSTURE: No Significant postural limitations   PALPATION: Ventral surface of left foot on toe pad    LOWER EXTREMITY ROM: 2nd toe on left foot PROM 5 deg ext   GAIT: Distance walked: 40 ft  Assistive device utilized: None Level of assistance: Complete Independence Comments: Supination with lateral wearing of shoe tread        TODAY'S TREATMENT:  06/06/22:  MANUAL   Transmetatarsal mobilizations             2nd digit traction with flexion and extension mobilizations              Myofacial release of plantar surface of left foot              Myofacial release of calf                        Transmetatarsal mobilizations             2nd digit traction with flexion and extension mobilizations              Myofacial release of plantar surface of left  foot              Myofacial release of calf    05/30/22:  THEREX  Self-massage with lacrosse ball on  ball of foot on left foot  Self-massage with lacrosse ball on medial longitudinal arch of left foot   MANUAL   Transmetatarsal mobilizations             2nd digit traction with flexion and extension mobilizations              Myofacial release of plantar surface of left foot              Myofacial release of calf                        Transmetatarsal mobilizations             2nd digit traction with flexion and extension mobilizations              Myofacial release of plantar surface of left foot              Myofacial release of calf   05/24/22:  THEREX  Exercises taken: [email or web address] Standing Toe Stretches on LLE 5 sec holds x 10  Double Limb Heel Raises with BUE support 1 x 10  Single Limb Heel Raises with BUE support  1 x 10  Towel Crunches in Standing 3 x 10   MANUAL   Transmetatarsal mobilizations             2nd digit traction with flexion and extension mobilizations              Myofacial release of plantar surface of left foot              Myofacial release of calf                         Transmetatarsal mobilizations             2nd digit traction with flexion and extension mobilizations              Myofacial release of plantar surface of left foot              Myofacial release of calf    05/21/22:   THEREX:   Long Sitting Toe Ext Stretch 3 x 30 sec  Long Sitting Toe Ext and Flex AROM 3 x 10 Full Kneel Toe Ext 3 x 30 sec  Calf Stretch 3 x 30 sec              MANUAL                           Transmetatarsal mobilizations             2nd digit traction with flexion and extension mobilizations              Myofacial release of plantar surface of left foot              Myofacial release of calf                        Transmetatarsal mobilizations             2nd digit traction with flexion and extension mobilizations              Myofacial release  of plantar surface of left foot              Myofacial release of calf     PATIENT EDUCATION:  Education details: form and technique for appropriate exercise  Person educated: Patient Education method: Explanation and Demonstration Education comprehension: verbalized understanding and returned demonstration     HOME EXERCISE PROGRAM: Access Code: [email or web address] URL: [email or web address] Date: 05/24/2022 Prepared by: Bradly Chris  Exercises - Long Sitting Calf Stretch with Strap  - 1 x daily - 7 x weekly - 3 reps - 60 hold - Towel Scrunches  - 1 x daily - 7 x weekly - 3 sets - 10 reps - Single Leg Heel Raise with Chair Support  - 1 x daily - 3 x weekly - 3 sets - 10 reps -Full Kneel Toe Ext 3 x 30 sec x 7 days per week     ASSESSMENT:   CLINICAL IMPRESSION:  Pt continues to demonstrate left plantar surface pain and 2nd digit immobility despite ongoing manual treatment. If no improvement in next couple of session, then will reach out to medical team for further evaluation and treatment. 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     OBJECTIVE IMPAIRMENTS pain, abnormal gait, and decreased ROM.    ACTIVITY LIMITATIONS locomotion level and walking long distances    PARTICIPATION LIMITATIONS: community activity and occupation   PERSONAL FACTORS Time since onset of injury/illness/exacerbation are also affecting patient's functional outcome.    REHAB POTENTIAL: Good   CLINICAL DECISION MAKING: Stable/uncomplicated   EVALUATION COMPLEXITY: Low     GOALS: Goals reviewed with patient? No   SHORT TERM GOALS: Target date: 05/31/2022  Pt will be independent with HEP in order to improve strength and balance in order to decrease fall risk and improve function at home and work. Baseline: NT  Goal status: ONGOING    2.  Patient will demonstrate knowledge of self mobilizations by accurately performing self mobilizations with no cueing for improved outcomes for 2nd digit ROM.  Baseline: NT  Goal status: ONGOING      LONG TERM GOALS: Target date: 07/26/2022    Patient will have improved function and activity level as evidenced by an increase in FOTO score by 10 points or more.  Baseline: 62/100 Goal status: ONGOING    2.  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  Baseline: NPS 3/10  Goal status: ONGOING          PLAN: PT FREQUENCY: 1-2x/week   PT DURATION: 10 weeks   PLANNED INTERVENTIONS: Therapeutic exercises, Therapeutic activity, Neuromuscular re-education, Balance training, Gait training, Patient/Family education, Self Care, Joint mobilization, Joint manipulation, Stair training, Orthotic/Fit training, Cryotherapy, Moist heat, scar mobilization, Manual therapy, and Re-evaluation   PLAN FOR NEXT SESSION: Eversion strengthening to attempt to correct supination, manual therapy with joint mobilizations   Bradly Chris PT, DPT  06/06/2022, 9:27  PM

## 2022-06-12 ENCOUNTER — Ambulatory Visit: Payer: BC Managed Care – PPO | Admitting: Physical Therapy

## 2022-06-12 DIAGNOSIS — M79675 Pain in left toe(s): Secondary | ICD-10-CM

## 2022-06-12 DIAGNOSIS — M79672 Pain in left foot: Secondary | ICD-10-CM

## 2022-06-12 NOTE — Therapy (Signed)
OUTPATIENT PHYSICAL THERAPY TREATMENT NOTE   Patient Name: Ryan Ritter MRN: [id number] DOB:1966/08/15, 56 y.o., male Today's Date: 06/12/2022  PCP: Dr. Steele Sizer  REFERRING PROVIDER: Dr. Tyson Dense   END OF SESSION:   PT End of Session - 06/12/22 1603     Visit Number 6    Number of Visits 10    Date for PT Re-Evaluation 07/26/22    Authorization Type BCBS    PT Start Time 1515    PT Stop Time 1545    PT Time Calculation (min) 30 min    Activity Tolerance Patient tolerated treatment well    Behavior During Therapy [facility] for tasks assessed/performed              Past Medical History:  Diagnosis Date   Allergy    Benign essential HTN 01/29/2007   Diabetes mellitus without complication ([place])    Hyperlipidemia    Mass of left thigh 10/09/2018   Obesity 03/14/2015   OSA on CPAP 05/29/2017   Plantar fasciitis 09/30/2015   Sleep apnea    Past Surgical History:  Procedure Laterality Date   ANAL FISSURE REPAIR     HAMMER TOE SURGERY Left 03/02/2022   2nd Left - Dr. Milinda Pointer   METATARSAL OSTEOTOMY Left 03/02/2022   2nd Left - Dr. 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 He has had ongoing pain in ball of foot along with pain at the top of 2nd met due to toe rubbing against the top of shoe because it is stuck in extension. He has tried self manipulations with no increased in toe flexion. 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 He has orthotics that he wears with his work boots, but he has not attempted wearing boots since having the procedure because he is afraid of the pain.   PRECAUTIONS: None   SUBJECTIVE: Pt reports that he is still having slight discomfort along bottom padding of right foot. He describes noticing more of a difference in foot pain and padding sensation after receiving whole foot massage.   PAIN:  Are you having pain? Yes: NPRS scale: 2-3/10 Pain location: Pain on the top of the 2nd digit. Pain description: Soreness  Aggravating factors: Wearing tight shoes or walking on foot Relieving factors: Sitting still  OBJECTIVE: (objective measures completed at initial evaluation unless otherwise dated)  DIAGNOSTIC FINDINGS: CLINICAL DATA:  Foot trauma. Tendon injury or dislocation suspected. Evaluate hammertoe of the second digit with possible capsule tear of the second metacarpophalangeal joint. Surgical consideration.   EXAM: MRI OF THE LEFT FOOT WITHOUT CONTRAST   TECHNIQUE: Multiplanar, multisequence MR imaging of the left forefoot was performed. No intravenous contrast was administered.   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 Previously there was predominantly degenerative cystic change in this region, now with interval increase in surrounding edema (sagittal series 8 images 16 through 21). This edema is high-grade throughout the entire intermediate cuneiform (sagittal image 18).   Small great toe metatarsophalangeal joint and second metatarsophalangeal joint effusions. The plantar plates appear intact. No definite capsular tear is seen. There is moderate flexion positioning of the second through fourth PIP joints.   Mild medial and lateral great toe metatarsophalangeal joint sesamoid marrow edema likely degenerative change.   There appears to be moderate cartilage thinning and mild peripheral degenerative osteophytosis of the tarsometatarsal joints diffusely.   Ligaments   Lisfranc ligament complex appears intact.   Muscles and Tendons   The visualized flexor and extensor tendons appear intact.   Soft tissues   There is mild edema and swelling within the subcutaneous fat of the fourth and fifth toes.   IMPRESSION:: IMPRESSION: 1. Moderate flexion positioning of second through fourth PIP joints. No definite capsule tear is identified with attention to the second metatarsophalangeal joint. There are mild first and second metatarsophalangeal joint effusions. 2. 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 Diffuse marrow edema throughout the entire intermediate cuneiform may be secondary to stress related and/or degenerative changes, likely a source of pain within the midfoot.   PATIENT SURVEYS:  FOTO 62/100 with target of 70   COGNITION:           Overall cognitive status: Within functional limits for tasks assessed                          SENSATION: WFL   EDEMA: No swelling noted over left foot         POSTURE: No Significant postural limitations   PALPATION: Ventral surface of left foot on toe pad    LOWER EXTREMITY ROM: 2nd toe on left foot PROM 5 deg ext   GAIT: Distance walked: 40 ft  Assistive device utilized: None Level of assistance: Complete Independence Comments: Supination with lateral wearing of shoe tread        TODAY'S TREATMENT:  06/12/22   Transmetatarsal mobilizations             2nd digit traction with flexion and extension mobilizations              Myofacial release of plantar surface of left foot              Myofacial release of calf                        Transmetatarsal mobilizations             2nd digit traction with flexion and extension mobilizations              Myofacial release of plantar surface of left foot  Myofacial release of calf   06/06/22:  MANUAL   Transmetatarsal mobilizations             2nd digit traction with flexion and extension mobilizations              Myofacial release of plantar surface of left foot              Myofacial release of calf                        Transmetatarsal mobilizations             2nd digit traction with flexion and extension mobilizations              Myofacial release of plantar surface of left foot              Myofacial release of calf    05/30/22:  THEREX  Self-massage with lacrosse ball on  ball of foot on left foot  Self-massage with lacrosse ball on medial longitudinal arch of left foot   MANUAL   Transmetatarsal mobilizations             2nd digit traction with flexion and extension mobilizations              Myofacial release of plantar surface of left foot              Myofacial release of calf                        Transmetatarsal mobilizations             2nd digit traction with flexion and extension mobilizations              Myofacial release of plantar surface of left foot              Myofacial release of calf   05/24/22:  THEREX  Exercises taken:  [email or web address] Standing Toe Stretches on LLE 5 sec holds x 10  Double Limb Heel Raises with BUE support 1 x 10  Single Limb Heel Raises with BUE support  1 x 10  Towel Crunches in Standing 3 x 10   MANUAL   Transmetatarsal mobilizations             2nd digit traction with flexion and extension mobilizations              Myofacial release of plantar surface of left foot              Myofacial release of calf                        Transmetatarsal mobilizations             2nd digit traction with flexion and extension mobilizations              Myofacial release of plantar surface of left foot              Myofacial release of calf    05/21/22:   THEREX:   Long Sitting Toe Ext Stretch 3 x 30 sec  Long Sitting Toe Ext and Flex AROM 3 x 10 Full Kneel Toe Ext 3 x 30 sec  Calf Stretch 3 x 30 sec              MANUAL  Transmetatarsal mobilizations             2nd digit traction with flexion and extension mobilizations              Myofacial release of plantar surface of left foot              Myofacial release of calf                        Transmetatarsal mobilizations             2nd digit traction with flexion and extension mobilizations              Myofacial release of plantar surface of left foot              Myofacial release of calf     PATIENT EDUCATION:  Education details: form and technique for appropriate exercise  Person educated: Patient Education method: Explanation and Demonstration Education comprehension: verbalized understanding and returned demonstration     HOME EXERCISE PROGRAM: Access Code: [email or web address] URL: [email or web address] Date: 05/24/2022 Prepared by: Bradly Chris  Exercises - Long Sitting Calf Stretch with Strap  - 1 x daily - 7 x weekly - 3 reps - 60 hold - Towel Scrunches  - 1 x daily - 7 x weekly - 3 sets - 10 reps - Single Leg Heel Raise with Chair Support  - 1 x daily  - 3 x weekly - 3 sets - 10 reps -Full Kneel Toe Ext 3 x 30 sec x 7 days per week     ASSESSMENT:   CLINICAL IMPRESSION:  Pt continues to experience ongoing pain and discomfort while placing pressure over left 2nd digit MCP despite ongoing manual treatment. Pain is located over site of screw placement and this is likely the pain generating area. Given ongoing lack of progress with manual therapy, if pt continues to have same level pain, then he will benefit from further medical evaluation and treatment. 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  OBJECTIVE IMPAIRMENTS pain, abnormal gait, and decreased ROM.    ACTIVITY LIMITATIONS locomotion level and walking long distances    PARTICIPATION LIMITATIONS: community activity and occupation   PERSONAL FACTORS Time since onset of injury/illness/exacerbation are also affecting patient's functional outcome.    REHAB POTENTIAL: Good   CLINICAL DECISION MAKING: Stable/uncomplicated   EVALUATION COMPLEXITY: Low     GOALS: Goals reviewed with patient? No   SHORT TERM GOALS: Target date: 05/31/2022  Pt will be independent with HEP in order to improve strength and balance in order to decrease fall risk and improve function at home and work. Baseline: NT  Goal status: ONGOING    2.  Patient will demonstrate knowledge of self mobilizations by accurately performing self mobilizations with no cueing for improved outcomes for 2nd digit ROM.  Baseline: NT  Goal status: ONGOING      LONG TERM GOALS: Target date: 07/26/2022    Patient will have improved function and activity level as evidenced by an increase in FOTO score by 10 points or more.  Baseline: 62/100 Goal status: ONGOING    2.  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   Baseline: NPS 3/10  Goal status:  ONGOING          PLAN: PT FREQUENCY: 1-2x/week   PT DURATION: 10 weeks   PLANNED INTERVENTIONS: Therapeutic exercises, Therapeutic activity, Neuromuscular re-education, Balance training, Gait training, Patient/Family education, Self Care, Joint mobilization, Joint manipulation, Stair training, Orthotic/Fit training, Cryotherapy, Moist heat, scar mobilization, Manual therapy, and Re-evaluation   PLAN FOR NEXT SESSION: Pad cushion to offload pressure over left 2nd digit MCP. Foot mobility exercises     Bradly Chris PT, DPT  06/12/2022, 4:05 PM

## 2022-06-19 ENCOUNTER — Encounter: Payer: Self-pay | Admitting: Physical Therapy

## 2022-06-19 ENCOUNTER — Ambulatory Visit: Payer: BC Managed Care – PPO | Admitting: Physical Therapy

## 2022-06-19 DIAGNOSIS — M79675 Pain in left toe(s): Secondary | ICD-10-CM | POA: Diagnosis not present

## 2022-06-19 DIAGNOSIS — M79672 Pain in left foot: Secondary | ICD-10-CM

## 2022-06-20 ENCOUNTER — Encounter: Payer: Self-pay | Admitting: Podiatry

## 2022-06-20 NOTE — Therapy (Signed)
OUTPATIENT PHYSICAL THERAPY TREATMENT NOTE   Patient Name: Ryan Ritter MRN: [id number] DOB:07-29-1966, 56 y.o., male Today's Date: 06/20/2022  PCP: Dr. Steele Sizer  REFERRING PROVIDER: Dr. Tyson Dense   END OF SESSION:   PT End of Session - 06/19/22 1600     Visit Number 7    Number of Visits 10    Date for PT Re-Evaluation 07/26/22    Authorization Type BCBS    PT Start Time 1600    PT Stop Time 1645    PT Time Calculation (min) 45 min    Activity Tolerance Patient tolerated treatment well    Behavior During Therapy [facility] for tasks assessed/performed              Past Medical History:  Diagnosis Date   Allergy    Benign essential HTN 01/29/2007   Diabetes mellitus without complication ([place])    Hyperlipidemia    Mass of left thigh 10/09/2018   Obesity 03/14/2015   OSA on CPAP 05/29/2017   Plantar fasciitis 09/30/2015   Sleep apnea    Past Surgical History:  Procedure Laterality Date   ANAL FISSURE REPAIR     HAMMER TOE SURGERY Left 03/02/2022   2nd Left - Dr. Milinda Pointer   METATARSAL OSTEOTOMY Left 03/02/2022   2nd Left - Dr. 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   Vitiligo 09/09/2018   OSA on CPAP 05/29/2017   Low HDL (under 40) 11/08/2016   Spondylolysis of cervical region 08/08/2016   Family history of colonic polyps 03/14/2015   Gastroesophageal reflux disease with esophagitis without hemorrhage 03/14/2015   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 He has had ongoing pain in ball of foot along with pain at the top of 2nd met due to toe rubbing against the top of shoe because it is stuck in extension. He has tried self manipulations with no increased in toe flexion. 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 He has orthotics that he wears with his work boots, but he has not attempted wearing boots since having the procedure because he is afraid of the pain.   PRECAUTIONS: None   SUBJECTIVE: Pt continues to have pain along padding of right foot. He recently spoke to podiatrist who offered to remove screws to decrease discomfort. He continues to walk his dog despite the pain.  PAIN:  Are you having pain? Yes: NPRS scale: 2-3/10 Pain location: Pain on the top of the 2nd digit. Pain description: Soreness  Aggravating factors: Wearing tight shoes or walking on foot Relieving factors: Sitting still    OBJECTIVE: (objective  measures completed at initial evaluation unless otherwise dated)  DIAGNOSTIC FINDINGS: CLINICAL DATA:  Foot trauma. Tendon injury or dislocation suspected. Evaluate hammertoe of the second digit with possible capsule tear of the second metacarpophalangeal joint. Surgical consideration.   EXAM: MRI OF THE LEFT FOOT WITHOUT CONTRAST   TECHNIQUE: Multiplanar, multisequence MR imaging of the left forefoot was performed. No intravenous contrast was administered.   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 Previously there was predominantly degenerative cystic change in this region, now with interval increase in surrounding edema (sagittal series 8 images 16 through 21). This edema is high-grade throughout the entire intermediate cuneiform (sagittal image 18).   Small great toe metatarsophalangeal joint and second metatarsophalangeal joint effusions. The plantar plates appear intact. No definite capsular tear is seen. There is moderate flexion positioning of the second through fourth PIP joints.   Mild medial and lateral great toe metatarsophalangeal joint sesamoid marrow edema likely degenerative change.   There appears to be moderate cartilage thinning and mild peripheral degenerative osteophytosis of the tarsometatarsal joints diffusely.   Ligaments   Lisfranc ligament complex appears intact.   Muscles and Tendons   The visualized flexor and extensor tendons appear intact.   Soft tissues   There is mild edema and swelling within the subcutaneous fat of the fourth and fifth toes.   IMPRESSION:: IMPRESSION: 1. Moderate flexion positioning of second through fourth PIP joints. No definite capsule tear is identified with attention to the second metatarsophalangeal joint. There are mild first and second metatarsophalangeal joint effusions. 2. 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 Diffuse marrow edema throughout the entire intermediate cuneiform may be secondary to stress related and/or degenerative changes, likely a source of pain within the midfoot.   PATIENT SURVEYS:  FOTO 62/100 with target of 70   COGNITION:           Overall cognitive status: Within functional limits for tasks assessed                          SENSATION: WFL   EDEMA: No swelling noted over left foot        POSTURE: No  Significant postural limitations   PALPATION: Ventral surface of left foot on toe pad    LOWER EXTREMITY ROM: 2nd toe on left foot PROM 5 deg ext   GAIT: Distance walked: 40 ft  Assistive device utilized: None Level of assistance: Complete Independence Comments: Supination with lateral wearing of shoe tread        TODAY'S TREATMENT:  06/20/22 Transmetatarsal mobilizations             2nd digit traction with flexion and extension mobilizations              Myofacial release of plantar surface of left foot              Myofacial release of calf                        Transmetatarsal mobilizations             2nd digit traction with flexion and extension mobilizations              Myofacial release of plantar surface of left foot                Myofacial release of calf   Education on tendons in bottom of foot and potential for tissue sliding on bone causing pain.  06/12/22  Transmetatarsal mobilizations             2nd digit traction with flexion and extension mobilizations              Myofacial release of plantar surface of left foot              Myofacial release of calf                        Transmetatarsal mobilizations             2nd digit traction with flexion and extension mobilizations              Myofacial release of plantar surface of left foot              Myofacial release of calf   06/06/22:  MANUAL   Transmetatarsal mobilizations             2nd digit traction with flexion and extension mobilizations              Myofacial release of plantar surface of left foot              Myofacial release of calf                        Transmetatarsal mobilizations             2nd digit traction with flexion and extension mobilizations              Myofacial release of plantar surface of left foot              Myofacial release of calf    05/30/22:  THEREX  Self-massage with lacrosse ball on  ball of foot on left foot  Self-massage with lacrosse ball on medial  longitudinal arch of left foot   MANUAL   Transmetatarsal mobilizations             2nd digit traction with flexion and extension mobilizations              Myofacial release of plantar surface of left foot              Myofacial release of calf                        Transmetatarsal mobilizations             2nd digit traction with flexion and extension mobilizations              Myofacial release of plantar surface of left foot              Myofacial release of calf   05/24/22:  THEREX  Exercises taken: [email or web address] Standing Toe Stretches on LLE 5 sec holds x 10  Double Limb Heel Raises with BUE support 1 x 10  Single Limb Heel Raises with BUE support  1 x 10  Towel Crunches in Standing 3 x 10   MANUAL   Transmetatarsal mobilizations             2nd digit traction with flexion and extension mobilizations              Myofacial release of plantar surface of left foot  Myofacial release of calf                        Transmetatarsal mobilizations             2nd digit traction with flexion and extension mobilizations              Myofacial release of plantar surface of left foot              Myofacial release of calf     PATIENT EDUCATION:  Education details: form and technique for appropriate exercise  Person educated: Patient Education method: Explanation and Demonstration Education comprehension: verbalized understanding and returned demonstration     HOME EXERCISE PROGRAM: Access Code: [email or web address] URL: [email or web address] Date: 05/24/2022 Prepared by: Daniel Fleming  Exercises - Long Sitting Calf Stretch with Strap  - 1 x daily - 7 x weekly - 3 reps - 60 hold - Towel Scrunches  - 1 x daily - 7 x weekly - 3 sets - 10 reps - Single Leg Heel Raise with Chair Support  - 1 x daily - 3 x weekly - 3 sets - 10 reps -Full Kneel Toe Ext 3 x 30 sec x 7 days per week     ASSESSMENT:   CLINICAL IMPRESSION:  Pt  continues to experience ongoing pain and discomfort while placing pressure over left 2nd digit MCP despite ongoing manual treatment. Pain is located over site of screw placement and this is likely the pain generating area. Given ongoing lack of progress with manual therapy, if pt continues to have same level pain, then he will benefit from further medical evaluation and treatment. 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  OBJECTIVE IMPAIRMENTS pain, abnormal gait, and decreased ROM.    ACTIVITY LIMITATIONS locomotion level and walking long distances    PARTICIPATION LIMITATIONS: community activity and occupation   PERSONAL FACTORS Time since onset of injury/illness/exacerbation are also affecting patient's functional outcome.    REHAB POTENTIAL: Good   CLINICAL DECISION MAKING: Stable/uncomplicated   EVALUATION COMPLEXITY: Low     GOALS: Goals reviewed with patient? No   SHORT TERM GOALS: Target date: 05/31/2022  Pt will be independent with HEP in order to improve strength and balance in order to decrease fall risk and improve function at home and work. Baseline: NT  Goal status: ONGOING    2.  Patient will demonstrate knowledge of self mobilizations by accurately performing self mobilizations with no cueing for improved outcomes for 2nd digit ROM.  Baseline: NT  Goal status: ONGOING      LONG TERM GOALS: Target date: 07/26/2022    Patient will have improved function and activity level as evidenced by an increase in FOTO score by 10 points or more.  Baseline: 62/100 Goal status: ONGOING    2.  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  Baseline: NPS 3/10  Goal status: ONGOING          PLAN: PT FREQUENCY: 1-2x/week   PT DURATION: 10 weeks   PLANNED  INTERVENTIONS: Therapeutic exercises, Therapeutic activity, Neuromuscular re-education, Balance training, Gait training, Patient/Family education, Self Care, Joint mobilization, Joint manipulation, Stair training, Orthotic/Fit training, Cryotherapy, Moist heat, scar mobilization, Manual therapy, and Re-evaluation     PLAN FOR NEXT SESSION: Soft tissue massage over plantar surface of foot along with 2nd digit mobilizations to increase mobility.    Bradly Chris PT, DPT  06/20/2022, 9:24 AM

## 2022-06-21 ENCOUNTER — Encounter: Payer: Self-pay | Admitting: Physical Therapy

## 2022-06-21 ENCOUNTER — Ambulatory Visit: Payer: BC Managed Care – PPO

## 2022-06-21 DIAGNOSIS — M79675 Pain in left toe(s): Secondary | ICD-10-CM

## 2022-06-21 DIAGNOSIS — M79672 Pain in left foot: Secondary | ICD-10-CM

## 2022-06-21 NOTE — Therapy (Signed)
OUTPATIENT PHYSICAL THERAPY TREATMENT NOTE   Patient Name: Ryan Ritter MRN: [id number] DOB:1966-04-16, 56 y.o., male Today's Date: 06/21/2022  PCP: Dr. Steele Sizer  REFERRING PROVIDER: Dr. Tyson Dense   END OF SESSION:   PT End of Session - 06/21/22 1618     Visit Number 8    Number of Visits 10    Date for PT Re-Evaluation 07/26/22    Authorization Type BCBS    PT Start Time 1551    PT Stop Time 1630    PT Time Calculation (min) 39 min    Activity Tolerance Patient tolerated treatment well    Behavior During Therapy [facility] for tasks assessed/performed               Past Medical History:  Diagnosis Date   Allergy    Benign essential HTN 01/29/2007   Diabetes mellitus without complication ([place])    Hyperlipidemia    Mass of left thigh 10/09/2018   Obesity 03/14/2015   OSA on CPAP 05/29/2017   Plantar fasciitis 09/30/2015   Sleep apnea    Past Surgical History:  Procedure Laterality Date   ANAL FISSURE REPAIR     HAMMER TOE SURGERY Left 03/02/2022   2nd Left - Dr. Milinda Pointer   METATARSAL OSTEOTOMY Left 03/02/2022   2nd Left - Dr. 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 He has had ongoing pain in ball of foot along with pain at the top of 2nd met due to toe rubbing against the top of shoe because it is stuck in extension. He has tried self manipulations with no increased in toe flexion. 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 He has orthotics that he wears with his work boots, but he has not attempted wearing boots since having the procedure because he is afraid of the pain.   PRECAUTIONS: None   SUBJECTIVE: Pt endorses 2-3/10 pain in foot. Symptoms overall remaining the same. Reached out to Dr. Milinda Pointer on screw removal but has not heard a response yet. PAIN:  Are you having pain? Yes: NPRS scale: 2-3/10 Pain location: Pain on the top of the 2nd digit. Pain description: Soreness  Aggravating factors: Wearing tight shoes or walking on foot Relieving factors: Sitting still    OBJECTIVE: (objective measures completed at initial evaluation unless  otherwise dated)  DIAGNOSTIC FINDINGS: CLINICAL DATA:  Foot trauma. Tendon injury or dislocation suspected. Evaluate hammertoe of the second digit with possible capsule tear of the second metacarpophalangeal joint. Surgical consideration.   EXAM: MRI OF THE LEFT FOOT WITHOUT CONTRAST   TECHNIQUE: Multiplanar, multisequence MR imaging of the left forefoot was performed. No intravenous contrast was administered.   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 Previously there was predominantly degenerative cystic change in this region, now with interval increase in surrounding edema (sagittal series 8 images 16 through 21). This edema is high-grade throughout the entire intermediate cuneiform (sagittal image 18).   Small great toe metatarsophalangeal joint and second metatarsophalangeal joint effusions. The plantar plates appear intact. No definite capsular tear is seen. There is moderate flexion positioning of the second through fourth PIP joints.   Mild medial and lateral great toe metatarsophalangeal joint sesamoid marrow edema likely degenerative change.   There appears to be moderate cartilage thinning and mild peripheral degenerative osteophytosis of the tarsometatarsal joints diffusely.   Ligaments   Lisfranc ligament complex appears intact.   Muscles and Tendons   The visualized flexor and extensor tendons appear intact.   Soft tissues   There is mild edema and swelling within the subcutaneous fat of the fourth and fifth toes.   IMPRESSION:: IMPRESSION: 1. Moderate flexion positioning of second through fourth PIP joints. No definite capsule tear is identified with attention to the second metatarsophalangeal joint. There are mild first and second metatarsophalangeal joint effusions. 2. 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 Diffuse marrow edema throughout the entire intermediate cuneiform may be secondary to stress related and/or degenerative changes, likely a source of pain within the midfoot.   PATIENT SURVEYS:  FOTO 62/100 with target of 70   COGNITION:           Overall cognitive status: Within functional limits for tasks assessed                          SENSATION: WFL   EDEMA: No swelling noted over left foot        POSTURE: No Significant postural limitations    PALPATION: Ventral surface of left foot on toe pad    LOWER EXTREMITY ROM: 2nd toe on left foot PROM 5 deg ext   GAIT: Distance walked: 40 ft  Assistive device utilized: None Level of assistance: Complete Independence Comments: Supination with lateral wearing of shoe tread        TODAY'S TREATMENT:  Manual Therapy 06/21/22- 25 minutes total Transmetatarsal mobilizations             2nd digit traction with flexion and extension mobilizations. Grade 3 for mobility, 10 sec bouts. 2x10.  Myofascial release of plantar surface of left foot  Myofascial release of calf   There.ex: Seated heel to toe slides for toe flexor stretch: 2x20, 2-3 sec holds.  Seated foot doming:  2x12, max TC's on digits to maintain extension due to lack of motor control Seated L foot distal flexor digit stretch on curb. 2x30 sec       PATIENT EDUCATION:  Education details: form and technique for appropriate exercise  Person educated: Patient Education method: Customer service manager Education comprehension: verbalized understanding and returned demonstration     HOME EXERCISE PROGRAM: Access Code:  [email or web address] URL: [email or web address] Date: 05/24/2022 Prepared by: Bradly Chris  Exercises - Long Sitting Calf Stretch with Strap  - 1 x daily - 7 x weekly - 3 reps - 60 hold - Towel Scrunches  - 1 x daily - 7 x weekly - 3 sets - 10 reps - Single Leg Heel Raise with Chair Support  - 1 x daily - 3 x weekly - 3 sets - 10 reps -Full Kneel Toe Ext 3 x 30 sec x 7 days per week     ASSESSMENT:   CLINICAL IMPRESSION:   Continuing PT POC with focus on improving toe/foot pain at 2nd metatarsal. No changes in symptoms post manual intervention and therex. Pt educated on trialing new exercises over the weekend and see if they improve his pain. Also educated to trial [place] on foot prior to therex to assist in muscle relaxing to see if modalities assist in improving toe extension. Pt verbalizing  understanding to education and new exercise. PT to continue POC as able to improve pain in L foot to return to work.  OBJECTIVE IMPAIRMENTS pain, abnormal gait, and decreased ROM.    ACTIVITY LIMITATIONS locomotion level and walking long distances    PARTICIPATION LIMITATIONS: community activity and occupation   PERSONAL FACTORS Time since onset of injury/illness/exacerbation are also affecting patient's functional outcome.    REHAB POTENTIAL: Good   CLINICAL DECISION MAKING: Stable/uncomplicated   EVALUATION COMPLEXITY: Low     GOALS: Goals reviewed with patient? No   SHORT TERM GOALS: Target date: 05/31/2022  Pt will be independent with HEP in order to improve strength and balance in order to decrease fall risk and improve function at home and work. Baseline: NT  Goal status: ONGOING    2.  Patient will demonstrate knowledge of self mobilizations by accurately performing self mobilizations with no cueing for improved outcomes for 2nd digit ROM.  Baseline: NT  Goal status: ONGOING      LONG TERM GOALS: Target date: 07/26/2022    Patient will have improved function and activity level as evidenced by an increase in FOTO score by 10 points or more.  Baseline: 62/100 Goal status: ONGOING    2.  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  Baseline: NPS 3/10  Goal status: ONGOING          PLAN: PT FREQUENCY: 1-2x/week   PT DURATION: 10 weeks   PLANNED INTERVENTIONS: Therapeutic exercises, Therapeutic activity, Neuromuscular re-education, Balance training, Gait training, Patient/Family education, Self Care, Joint mobilization, Joint manipulation, Stair training, Orthotic/Fit training, Cryotherapy, Moist heat, scar mobilization, Manual therapy, and Re-evaluation   PLAN FOR NEXT SESSION: Soft tissue massage over plantar surface of foot along with 2nd digit mobilizations to increase  mobility.    Salem Caster. Fairly IV, PT, DPT Physical Therapist- [facility]  06/21/2022, 5:37 PM

## 2022-06-25 ENCOUNTER — Ambulatory Visit: Payer: BC Managed Care – PPO

## 2022-06-25 DIAGNOSIS — M79675 Pain in left toe(s): Secondary | ICD-10-CM

## 2022-06-25 DIAGNOSIS — R262 Difficulty in walking, not elsewhere classified: Secondary | ICD-10-CM

## 2022-06-25 DIAGNOSIS — M79672 Pain in left foot: Secondary | ICD-10-CM

## 2022-06-25 DIAGNOSIS — M6281 Muscle weakness (generalized): Secondary | ICD-10-CM

## 2022-06-25 NOTE — Therapy (Signed)
OUTPATIENT PHYSICAL THERAPY TREATMENT NOTE   Patient Name: Ryan Ritter MRN: [id number] DOB:[birth year], 56 y.o., male Today's Date: 06/25/2022  PCP: Dr. Steele Sizer  REFERRING PROVIDER: Dr. Tyson Dense   END OF SESSION:   PT End of Session - 06/25/22 1804     Visit Number 9    Number of Visits 10    Date for PT Re-Evaluation 07/26/22    Authorization Type BCBS    PT Start Time 1804    PT Stop Time 1852    PT Time Calculation (min) 48 min    Activity Tolerance Patient tolerated treatment well    Behavior During Therapy [facility] for tasks assessed/performed               Past Medical History:  Diagnosis Date   Allergy    Benign essential HTN 01/29/2007   Diabetes mellitus without complication ([place])    Hyperlipidemia    Mass of left thigh 10/09/2018   Obesity 03/14/2015   OSA on CPAP 05/29/2017   Plantar fasciitis 09/30/2015   Sleep apnea    Past Surgical History:  Procedure Laterality Date   ANAL FISSURE REPAIR     HAMMER TOE SURGERY Left 03/02/2022   2nd Left - Dr. Milinda Pointer   METATARSAL OSTEOTOMY Left 03/02/2022   2nd Left - Dr. 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   Vitiligo 09/09/2018   OSA on CPAP 05/29/2017   Low HDL (under 40) 11/08/2016   Spondylolysis of cervical region 08/08/2016   Family history of colonic polyps 03/14/2015   Gastroesophageal reflux disease with esophagitis without hemorrhage 03/14/2015   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 He has had ongoing pain in ball of foot along with pain at the top of 2nd met due to toe rubbing against the top of shoe because it is stuck in extension. He has tried self manipulations with no increased in toe flexion. 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 He has orthotics that he wears with his work boots, but he has not attempted wearing boots since having the procedure because he is afraid of the pain.   PRECAUTIONS: None   SUBJECTIVE:   Pt reports 3/10 pain today, everything staying the same at this current time.  Pt still has not hear anything back from Dr. Milinda Pointer and just checked again a few minutes prior to arrival.   PAIN:  Are you having pain? Yes: NPRS scale: 2-3/10 Pain location: Pain on the top of the 2nd digit. Pain description: Soreness  Aggravating  factors: Wearing tight shoes or walking on foot Relieving factors: Sitting still    OBJECTIVE: (objective measures completed at initial evaluation unless otherwise dated)  DIAGNOSTIC FINDINGS: CLINICAL DATA:  Foot trauma. Tendon injury or dislocation suspected. Evaluate hammertoe of the second digit with possible capsule tear of the second metacarpophalangeal joint. Surgical consideration.   EXAM: MRI OF THE LEFT FOOT WITHOUT CONTRAST   TECHNIQUE: Multiplanar, multisequence MR imaging of the left forefoot was performed. No intravenous contrast was administered.   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 Previously there was predominantly degenerative cystic change in this region, now with interval increase in surrounding edema (sagittal series 8 images 16 through 21). This edema is high-grade throughout the entire intermediate cuneiform (sagittal image 18).   Small great toe metatarsophalangeal joint and second metatarsophalangeal joint effusions. The plantar plates appear intact. No definite capsular tear is seen. There is moderate flexion positioning of the second through fourth PIP joints.   Mild medial and lateral great toe metatarsophalangeal joint sesamoid marrow edema likely degenerative change.   There appears to be moderate cartilage thinning and mild peripheral degenerative osteophytosis of the tarsometatarsal joints diffusely.   Ligaments   Lisfranc ligament complex appears intact.   Muscles and Tendons   The visualized flexor and extensor tendons appear intact.   Soft tissues   There is mild edema and swelling within the subcutaneous fat of the fourth and fifth toes.   IMPRESSION:: IMPRESSION: 1. Moderate flexion positioning of second through fourth PIP joints. No definite capsule tear is identified with attention to the second metatarsophalangeal joint. There are mild first and second metatarsophalangeal joint effusions. 2. 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 Diffuse marrow edema throughout the entire intermediate cuneiform may be secondary to stress related and/or degenerative changes, likely a source of pain within the midfoot.   PATIENT SURVEYS:  FOTO 62/100 with target of 70   COGNITION:           Overall cognitive status: Within functional limits for tasks assessed                           SENSATION: WFL   EDEMA: No swelling noted over left foot        POSTURE: No Significant postural limitations   PALPATION: Ventral surface of left foot on toe pad    LOWER EXTREMITY ROM: 2nd toe on left foot PROM 5 deg ext   GAIT: Distance walked: 40 ft  Assistive device utilized: None Level of assistance: Complete Independence Comments: Supination with lateral wearing of shoe tread        TODAY'S TREATMENT:  06/25/22  Manual Therapy  Transmetatarsal mobilizations  2nd digit traction with flexion and extension mobilizations. Grade 3 for mobility, 10 sec bouts. 2x10.  Myofascial release of dorsal/plantar surface of left foot  STM with TP release technique applied to the posterior gastroc/soleus complex   There.ex:  Seated toe extension stretch, 30 sec bouts x4 Seated heel to toe slides for toe flexor stretch: 4x30 sec Seated foot doming:  2x12, max TC's on digits to maintain extension due to lack of motor control Standing towel scrunches with metatarsals, 2x15 reps of flexion       PATIENT EDUCATION:  Education details: form and technique for appropriate exercise  Person educated:  Patient Education method: Explanation and Demonstration Education comprehension: verbalized understanding and returned demonstration     HOME EXERCISE PROGRAM: Access Code: [email or web address] URL: [email or web address] Date: 05/24/2022 Prepared by: Bradly Chris  Exercises - Long Sitting Calf Stretch with Strap  - 1 x daily - 7 x weekly - 3 reps - 60 hold - Towel Scrunches  - 1 x daily - 7 x weekly - 3 sets - 10 reps - Single Leg Heel Raise with Chair Support  - 1 x daily - 3 x weekly - 3 sets - 10 reps -Full Kneel Toe Ext 3 x 30 sec x 7 days per week     ASSESSMENT:   CLINICAL IMPRESSION:  Pt puts forth good effort throughout the session with all exercises and noted to have minimal pain relief following manual therapy.  Pt noted increased  mobility of the foot with ambulation, stating that the joints felt looser when performing push-off of gait phase.  Pt will continue to benefit from skilled therapy to address remaining deficits in order to improve overall QoL and return to PLOF.     OBJECTIVE IMPAIRMENTS pain, abnormal gait, and decreased ROM.    ACTIVITY LIMITATIONS locomotion level and walking long distances    PARTICIPATION LIMITATIONS: community activity and occupation   PERSONAL FACTORS Time since onset of injury/illness/exacerbation are also affecting patient's functional outcome.    REHAB POTENTIAL: Good   CLINICAL DECISION MAKING: Stable/uncomplicated   EVALUATION COMPLEXITY: Low     GOALS: Goals reviewed with patient? No   SHORT TERM GOALS: Target date: 05/31/2022  Pt will be independent with HEP in order to improve strength and balance in order to decrease fall risk and improve function at home and work. Baseline: NT  Goal status: ONGOING    2.  Patient will demonstrate knowledge of self mobilizations by accurately performing self mobilizations with no cueing for improved outcomes for 2nd digit ROM.  Baseline: NT  Goal status: ONGOING      LONG TERM GOALS: Target date: 07/26/2022    Patient will have improved function and activity level as evidenced by an increase in FOTO score by 10 points or more.  Baseline: 62/100 Goal status: ONGOING    2.  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  Baseline: NPS 3/10  Goal status: ONGOING          PLAN: PT FREQUENCY: 1-2x/week   PT DURATION: 10 weeks   PLANNED INTERVENTIONS: Therapeutic exercises, Therapeutic activity, Neuromuscular re-education, Balance training, Gait training, Patient/Family education, Self Care, Joint mobilization, Joint manipulation, Stair training, Orthotic/Fit training, Cryotherapy, Moist heat, scar mobilization, Manual therapy, and  Re-evaluation   PLAN FOR NEXT SESSION: Soft tissue massage over plantar surface of foot along with 2nd digit mobilizations to increase mobility.     Gwenlyn Saran, PT, DPT Physical Therapist- [facility]  06/25/2022, 7:03 PM

## 2022-06-27 ENCOUNTER — Ambulatory Visit: Payer: BC Managed Care – PPO | Admitting: Physical Therapy

## 2022-06-27 DIAGNOSIS — M79675 Pain in left toe(s): Secondary | ICD-10-CM | POA: Diagnosis not present

## 2022-06-27 DIAGNOSIS — M79672 Pain in left foot: Secondary | ICD-10-CM

## 2022-06-27 DIAGNOSIS — R262 Difficulty in walking, not elsewhere classified: Secondary | ICD-10-CM

## 2022-06-27 NOTE — Therapy (Unsigned)
OUTPATIENT PHYSICAL THERAPY TREATMENT NOTE   Patient Name: Ryan Ritter MRN: [id number] DOB:[birth year], 56 y.o., male Today's Date: 06/27/2022  PCP: Dr. Steele Sizer  REFERRING PROVIDER: Dr. Tyson Dense   END OF SESSION:       Past Medical History:  Diagnosis Date   Allergy    Benign essential HTN 01/29/2007   Diabetes mellitus without complication ([place])    Hyperlipidemia    Mass of left thigh 10/09/2018   Obesity 03/14/2015   OSA on CPAP 05/29/2017   Plantar fasciitis 09/30/2015   Sleep apnea    Past Surgical History:  Procedure Laterality Date   ANAL FISSURE REPAIR     HAMMER TOE SURGERY Left 03/02/2022   2nd Left - Dr. Milinda Pointer   METATARSAL OSTEOTOMY Left 03/02/2022   2nd Left - Dr. 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  Rationale for Evaluation and Treatment Rehabilitation  PERTINENT HISTORY: Pt reports that he has ongoing 2nd metatarsal stiffness from a metatarsal osteotomy on June 2nd. He has had ongoing pain in ball of foot along with pain at the top of 2nd met due to toe  rubbing against the top of shoe because it is stuck in extension. He has tried self manipulations with no increased in toe flexion. 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 He has orthotics that he wears with his work boots, but he has not attempted wearing boots since having the procedure because he is afraid of the pain.   PRECAUTIONS: None   SUBJECTIVE:   Patient continues to feel the same of level of pain as when he initially presented to PT. He is awaiting to hear back from Dr. Milinda Pointer and he continues to do exercises daily.    PAIN:  Are you having pain? Yes: NPRS scale: 2-3/10 Pain location: Pain on the top of the 2nd digit. Pain description: Soreness  Aggravating factors: Wearing tight shoes or walking on foot Relieving factors: Sitting still    OBJECTIVE: (objective measures completed at initial evaluation unless otherwise dated)  DIAGNOSTIC FINDINGS: CLINICAL DATA:  Foot trauma. Tendon injury or dislocation suspected. Evaluate hammertoe of the second digit with possible capsule tear of the second metacarpophalangeal joint. Surgical consideration.   EXAM: MRI OF THE LEFT FOOT WITHOUT CONTRAST   TECHNIQUE: Multiplanar, multisequence MR imaging of the left forefoot was performed. No intravenous contrast was administered.   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 Previously there was predominantly degenerative cystic change in this region, now with interval increase in surrounding edema (sagittal series 8 images 16 through 21). This edema is high-grade throughout the entire intermediate  cuneiform (sagittal image 18).   Small great toe metatarsophalangeal joint and second metatarsophalangeal joint effusions. The plantar plates appear intact. No definite capsular tear is seen. There is moderate flexion positioning of the second through fourth PIP joints.   Mild medial and lateral great toe metatarsophalangeal joint sesamoid marrow edema likely degenerative change.   There appears to be moderate cartilage thinning and mild peripheral degenerative osteophytosis of the tarsometatarsal joints diffusely.   Ligaments   Lisfranc ligament complex appears intact.   Muscles and Tendons   The visualized flexor and extensor tendons appear intact.   Soft tissues   There is mild edema and swelling within the subcutaneous fat of the fourth and fifth toes.   IMPRESSION:: IMPRESSION: 1. Moderate flexion positioning of second through fourth PIP joints. No definite capsule tear is identified with attention to the second metatarsophalangeal joint. There are mild first and second metatarsophalangeal joint effusions. 2. 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 Diffuse marrow edema throughout the entire intermediate cuneiform may be secondary to stress related and/or degenerative changes, likely a source of pain within the midfoot.   PATIENT SURVEYS:  FOTO 62/100 with target of 70   COGNITION:           Overall cognitive status: Within functional limits for tasks assessed                          SENSATION: WFL   EDEMA: No swelling noted over left foot        POSTURE: No Significant postural limitations   PALPATION: Ventral surface of left foot on toe pad    LOWER EXTREMITY ROM: 2nd toe on left foot PROM 5 deg ext   GAIT: Distance walked: 40 ft  Assistive device utilized: None Level of assistance: Complete Independence Comments: Supination with lateral wearing of shoe tread        TODAY'S TREATMENT:  06/27/22  Manual  Therapy  Transmetatarsal mobilizations  2nd digit traction with flexion and extension mobilizations. Grade 3 for mobility, 10 sec bouts. 2x10.  Myofascial release of dorsal/plantar surface of left foot  STM with TP release technique applied to the posterior gastroc/soleus complex   06/25/22  Manual Therapy  Transmetatarsal mobilizations  2nd digit traction with flexion and extension mobilizations. Grade 3 for mobility, 10 sec bouts. 2x10.  Myofascial release of dorsal/plantar surface of left foot  STM with TP release technique applied to the posterior gastroc/soleus complex   There.ex:  Seated toe extension stretch, 30 sec bouts x4 Seated heel to toe slides for toe flexor stretch: 4x30 sec Seated foot doming:  2x12, max TC's on digits to maintain extension due to lack of motor control Standing towel scrunches with metatarsals, 2x15 reps of flexion       PATIENT EDUCATION:  Education details: form and technique for appropriate exercise  Person educated: Patient Education method: Explanation and Demonstration Education comprehension: verbalized understanding and returned demonstration     HOME EXERCISE PROGRAM: Access Code: [email or web address] URL: [email or web address] Date: 05/24/2022 Prepared by: Bradly Chris  Exercises - Long Sitting Calf Stretch with Strap  - 1 x daily - 7 x weekly - 3 reps - 60 hold - Towel  Scrunches  - 1 x daily - 7 x weekly - 3 sets - 10 reps - Single Leg Heel Raise with Chair Support  - 1 x daily - 3 x weekly - 3 sets - 10 reps -Full Kneel Toe Ext 3 x 30 sec x 7 days per week     ASSESSMENT:   CLINICAL IMPRESSION:  Pt continues to experience the same level of pain in plantar surface of left foot along with decreased 2nd digit immobility that makes it difficulty for him to stand on his feet for long periods of time. PT explained need for further evaluation and treatment by medical team and that he would likely be discharged after this  session if he continued to feel same level of pain and symptoms.   OBJECTIVE IMPAIRMENTS pain, abnormal gait, and decreased ROM.    ACTIVITY LIMITATIONS locomotion level and walking long distances    PARTICIPATION LIMITATIONS: community activity and occupation   PERSONAL FACTORS Time since onset of injury/illness/exacerbation are also affecting patient's functional outcome.    REHAB POTENTIAL: Good   CLINICAL DECISION MAKING: Stable/uncomplicated   EVALUATION COMPLEXITY: Low     GOALS: Goals reviewed with patient? No   SHORT TERM GOALS: Target date: 05/31/2022  Pt will be independent with HEP in order to improve strength and balance in order to decrease fall risk and improve function at home and work. Baseline: NT  Goal status: ONGOING    2.  Patient will demonstrate knowledge of self mobilizations by accurately performing self mobilizations with no cueing for improved outcomes for 2nd digit ROM.  Baseline: NT  Goal status: ONGOING      LONG TERM GOALS: Target date: 07/26/2022    Patient will have improved function and activity level as evidenced by an increase in FOTO score by 10 points or more.  Baseline: 62/100 Goal status: ONGOING    2.  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  Baseline: NPS 3/10  Goal status: ONGOING          PLAN: PT FREQUENCY: 1-2x/week   PT DURATION: 10 weeks   PLANNED INTERVENTIONS: Therapeutic exercises, Therapeutic activity, Neuromuscular re-education, Balance training, Gait training, Patient/Family education, Self Care, Joint mobilization, Joint manipulation, Stair training, Orthotic/Fit training, Cryotherapy, Moist heat, scar mobilization, Manual therapy, and Re-evaluation   PLAN FOR NEXT SESSION: Reassess goals. If ongoing pain then discharge from [place], PT, DPT Physical Therapist- [place]  06/27/2022, 6:11 PM

## 2022-06-28 ENCOUNTER — Inpatient Hospital Stay: Payer: BC Managed Care – PPO | Admitting: Oncology

## 2022-06-28 ENCOUNTER — Encounter: Payer: Self-pay | Admitting: Physical Therapy

## 2022-06-28 ENCOUNTER — Encounter: Payer: Self-pay | Admitting: Oncology

## 2022-06-28 DIAGNOSIS — E538 Deficiency of other specified B group vitamins: Secondary | ICD-10-CM | POA: Diagnosis not present

## 2022-06-28 DIAGNOSIS — D696 Thrombocytopenia, unspecified: Secondary | ICD-10-CM | POA: Diagnosis not present

## 2022-06-28 NOTE — Progress Notes (Signed)
Pt here for follow up. No new concerns voiced.   

## 2022-06-28 NOTE — Assessment & Plan Note (Addendum)
Previous labs reviewed and discussed with patient. Chronic thrombocytopenia, levels were above 100,000. Labs are reviewed and discussed with patient. CBC showed platelet count of 143,000,  normal vitamin B12, normal folate, normal immature platelet fraction, negative HIV, negative hepatitis panel,normal LDH, negative peripheral blood flow cytometry, negative M protein on multiple myeloma panel, normal light chain ratio. He has no constitutional symptoms.  Recommend observation.

## 2022-06-28 NOTE — Assessment & Plan Note (Signed)
recommend patient to start oral vitamin B12 supplementation

## 2022-06-28 NOTE — Progress Notes (Signed)
Hematology/Oncology Consult Note [phone] [phone]    Patient Care Team: Steele Sizer, MD as PCP - General (Family Medicine) Mosetta Anis, MD as Referring Physician (Allergy)  REFERRING PROVIDER: Steele Sizer, MD  CHIEF COMPLAINTS/REASON FOR VISIT:   thrombocytopenia  HISTORY OF PRESENTING ILLNESS:  Ryan Ritter is a 56 y.o. male who was seen in consultation at the request of Steele Sizer, MD for evaluation of thrombocytopenia   12/05/2021 patient had abnormal Labs which showed decreased platelet counts 131,000.  Reviewed patient's previous labs. Thrombocytopenia is chronic chronic onset , since at least 6 years ago Stable reticulocyte count 130,000s range Denies weight loss, fever, chills, fatigue, occasionally he has night sweats.  Denies hematochezia, hematuria, hematemesis, epistaxis, black tarry stool.  Patient has no easy bruising.   Patient denies any alcohol use He works as a Surveyor, minerals.    MEDICAL HISTORY:  Past Medical History:  Diagnosis Date   Allergy    Benign essential HTN 01/29/2007   Diabetes mellitus without complication ([place])    Hyperlipidemia    Mass of left thigh 10/09/2018   Obesity 03/14/2015   OSA on CPAP 05/29/2017   Plantar fasciitis 09/30/2015   Sleep apnea     SURGICAL HISTORY: Past Surgical History:  Procedure Laterality Date   ANAL FISSURE REPAIR     HAMMER TOE SURGERY Left 03/02/2022   2nd Left - Dr. Milinda Pointer   METATARSAL OSTEOTOMY Left 03/02/2022   2nd Left - Dr. Milinda Pointer   tendinitis Left    TRIGGER FINGER RELEASE     vascectomy  08/2014    SOCIAL HISTORY: Social History   Socioeconomic History   Marital status: Married    Spouse name: Not on file   Number of children: Not on file   Years of education: Not on file   Highest education level: Not on file  Occupational History   Not on file  Tobacco Use   Smoking status: Never   Smokeless tobacco: Former    Types: Chew    Quit date: 10/01/1988   Vaping Use   Vaping Use: Never used  Substance and Sexual Activity   Alcohol use: No    Alcohol/week: 0.0 standard drinks of alcohol   Drug use: No   Sexual activity: Yes    Partners: Female  Other Topics Concern   Not on file  Social History Narrative   Not on file   Social Determinants of Health   Financial Resource Strain: Low Risk  (12/12/2021)   Overall Financial Resource Strain (CARDIA)    Difficulty of Paying Living Expenses: Not hard at all  Food Insecurity: No Food Insecurity (12/12/2021)   Hunger Vital Sign    Worried About Running Out of Food in the Last Year: Never true    [place] in the Last Year: Never true  Transportation Needs: No Transportation Needs (12/12/2021)   PRAPARE - Hydrologist (Medical): No    Lack of Transportation (Non-Medical): No  Physical Activity: Insufficiently Active (12/12/2021)   Exercise Vital Sign    Days of Exercise per Week: 7 days    Minutes of Exercise per Session: 20 min  Stress: Stress Concern Present (12/12/2021)   [place]    Feeling of Stress : To some extent  Social Connections: Socially Integrated (12/12/2021)   Social Connection and Isolation Panel [NHANES]    Frequency of Communication with Friends and Family: More than three times  a week    Frequency of Social Gatherings with Friends and Family: Once a week    Attends Religious Services: More than 4 times per year    Active Member of [employer] or Organizations: Yes    Attends Music therapist: More than 4 times per year    Marital Status: Married  Human resources officer Violence: Not At Risk (12/12/2021)   Humiliation, Afraid, Rape, and Kick questionnaire    Fear of Current or Ex-Partner: No    Emotionally Abused: No    Physically Abused: No    Sexually Abused: No    FAMILY HISTORY: Family History  Problem Relation Age of Onset   Breast cancer Mother    Colon  polyps Mother    Diabetes Father    Pancreatic cancer Father    Diabetes Brother    Diabetes Maternal Grandmother    Cancer Maternal Grandfather        leukemia   Leukemia Maternal Grandfather    Cancer Paternal Grandfather        multiple myeloma   Multiple myeloma Paternal Grandfather     ALLERGIES:  is allergic to benicar [olmesartan] and lisinopril.  MEDICATIONS:  Current Outpatient Medications  Medication Sig Dispense Refill   Accu-Chek Softclix Lancets lancets SMARTSIG:2 Topical Twice Daily     Boron 3 MG CAPS Take 1 capsule by mouth 3 (three) times daily.     Chromium 200 MCG CAPS Take 200 mcg by mouth 2 (two) times daily.      Cinnamon 500 MG TABS Take 1,000 tablets by mouth 2 (two) times daily.     Cod Liver Oil 5000-500 UNIT/5ML OIL Take by mouth.     Coenzyme Q10 (CO Q10) 200 MG CAPS Take 200 mg by mouth daily.     Continuous Blood Gluc Sensor (DEXCOM G7 SENSOR) MISC 1 each by Does not apply route as directed. Apply one every 10 days 9 each 1   fluticasone (FLONASE) 50 MCG/ACT nasal spray 2 sprays in each nostril Once a day Nasally 30 days     glucose blood (ACCU-CHEK GUIDE) test strip USE TO TEST UP TO 4 TIMES A DAY 100 strip 2   Insulin Pen Needle 32G X 4 MM MISC 1 each by Does not apply route daily at 12 noon. Twice daily prn 100 each 2   levocetirizine (XYZAL) 5 MG tablet Take 5 mg by mouth every evening.      metFORMIN (GLUCOPHAGE-XR) 750 MG 24 hr tablet Take 1,500 mg by mouth daily with breakfast.     NOVOLOG FLEXPEN 100 UNIT/ML FlexPen Inject 6-14 Units into the skin 2 (two) times daily as needed.     EPINEPHrine 0.3 mg/0.3 mL IJ SOAJ injection epinephrine 0.3 mg/0.3 mL injection, auto-injector (Patient not taking: Reported on 06/01/2022)     No current facility-administered medications for this visit.    Review of Systems  Constitutional:  Negative for appetite change, chills, fatigue, fever and unexpected weight change.  HENT:   Negative for hearing loss and  voice change.   Eyes:  Negative for eye problems and icterus.  Respiratory:  Negative for chest tightness, cough and shortness of breath.   Cardiovascular:  Negative for chest pain and leg swelling.  Gastrointestinal:  Negative for abdominal distention and abdominal pain.  Endocrine: Negative for hot flashes.  Genitourinary:  Negative for difficulty urinating, dysuria and frequency.   Musculoskeletal:  Negative for arthralgias.  Skin:  Negative for itching and rash.  Neurological:  Negative  for light-headedness and numbness.  Hematological:  Negative for adenopathy. Does not bruise/bleed easily.  Psychiatric/Behavioral:  Negative for confusion.     PHYSICAL EXAMINATION: ECOG PERFORMANCE STATUS: 0 - Asymptomatic Vitals:   06/28/22 1116  BP: 130/66  Pulse: (!) 59  Resp: 18  Temp: 97.8 F (36.6 C)   Filed Weights   06/28/22 1116  Weight: 237 lb 6.4 oz (107.7 kg)    Physical Exam Constitutional:      General: He is not in acute distress. HENT:     Head: Normocephalic and atraumatic.  Eyes:     General: No scleral icterus. Cardiovascular:     Rate and Rhythm: Normal rate and regular rhythm.     Heart sounds: Normal heart sounds.  Pulmonary:     Effort: Pulmonary effort is normal. No respiratory distress.     Breath sounds: No wheezing.  Abdominal:     General: Bowel sounds are normal. There is no distension.     Palpations: Abdomen is soft.  Musculoskeletal:        General: No deformity. Normal range of motion.     Cervical back: Normal range of motion and neck supple.  Skin:    General: Skin is warm and dry.     Findings: No erythema or rash.  Neurological:     Mental Status: He is alert and oriented to person, place, and time. Mental status is at baseline.     Cranial Nerves: No cranial nerve deficit.  Psychiatric:        Mood and Affect: Mood normal.      LABORATORY DATA:  I have reviewed the data as listed    Latest Ref Rng & Units 06/01/2022   10:18 AM  12/05/2021    8:30 AM 01/12/2021   11:23 AM  CBC  WBC 4.0 - 10.5 K/uL 4.2  4.0  6.3   Hemoglobin 13.0 - 17.0 g/dL 14.5  14.7  14.7   Hematocrit 39.0 - 52.0 % 41.8  43.8  44.3   Platelets 150 - 400 K/uL 143  131  139       Latest Ref Rng & Units 12/05/2021    8:30 AM 01/12/2021   11:23 AM 07/26/2020    8:19 AM  CMP  Glucose 65 - 99 mg/dL 334  192  170   BUN 7 - 25 mg/dL 19  17  26    Creatinine 0.70 - 1.30 mg/dL 0.86  0.85  0.84   Sodium 135 - 146 mmol/L 135  138  140   Potassium 3.5 - 5.3 mmol/L 4.6  4.4  4.6   Chloride 98 - 110 mmol/L 102  103  105   CO2 20 - 32 mmol/L 24  28  26    Calcium 8.6 - 10.3 mg/dL 8.9  9.1  9.8   Total Protein 6.1 - 8.1 g/dL 6.5  6.6    Total Bilirubin 0.2 - 1.2 mg/dL 0.6  1.0    AST 10 - 35 U/L 15  13    ALT 9 - 46 U/L 23  21       RADIOGRAPHIC STUDIES: I have personally reviewed the radiological images as listed and agreed with the findings in the report.  DG Foot Complete Left  Result Date: 05/09/2022 Please see detailed radiograph report in office note.  DG Foot Complete Left  Result Date: 04/16/2022 Please see detailed radiograph report in office note.    ASSESSMENT & PLAN:   Thrombocytopenia ([place]) Previous labs reviewed  and discussed with patient. Chronic thrombocytopenia, levels were above 100,000. Labs are reviewed and discussed with patient. CBC showed platelet count of 143,000,  normal vitamin B12, normal folate, normal immature platelet fraction, negative HIV, negative hepatitis panel,normal LDH, negative peripheral blood flow cytometry, negative M protein on multiple myeloma panel, normal light chain ratio. He has no constitutional symptoms.  Recommend observation.    Low vitamin B12 level recommend patient to start oral vitamin B12 supplementation  Patient follow-up with me in approximately 6 months   Orders Placed This Encounter  Procedures   CBC with Differential/Platelet    Standing Status:   Future    Standing  Expiration Date:   06/29/2023   Comprehensive metabolic panel    Standing Status:   Future    Standing Expiration Date:   06/28/2023   Technologist smear review    Order Specific Question:   Clinical information:    Answer:   thrombocytopenia   Vitamin B12    Standing Status:   Future    Standing Expiration Date:   06/29/2023    All questions were answered. The patient knows to call the clinic with any problems questions or concerns.  Cc Steele Sizer, MD   Earlie Server, MD, PhD 06/28/2022

## 2022-07-04 ENCOUNTER — Ambulatory Visit: Payer: BC Managed Care – PPO | Attending: Podiatry | Admitting: Physical Therapy

## 2022-07-04 DIAGNOSIS — R262 Difficulty in walking, not elsewhere classified: Secondary | ICD-10-CM

## 2022-07-04 DIAGNOSIS — M79672 Pain in left foot: Secondary | ICD-10-CM

## 2022-07-04 NOTE — Therapy (Signed)
OUTPATIENT PHYSICAL THERAPY PROGRESS NOTE   Patient Name: Ryan Ritter MRN: [id number] DOB:[birth year], 56 y.o., male Today's Date: 07/04/2022  PCP: Dr. Steele Sizer  REFERRING PROVIDER: Dr. Tyson Dense   END OF SESSION:   PT End of Session - 07/04/22 0927     Visit Number 11    Number of Visits 15    Date for PT Re-Evaluation 07/26/22    Authorization Type BCBS    Progress Note Due on Visit 15    PT Start Time 0845    PT Stop Time 0930    PT Time Calculation (min) 45 min    Activity Tolerance Patient tolerated treatment well    Behavior During Therapy [facility] for tasks assessed/performed                Past Medical History:  Diagnosis Date   Allergy    Benign essential HTN 01/29/2007   Diabetes mellitus without complication ([place])    Hyperlipidemia    Mass of left thigh 10/09/2018   Obesity 03/14/2015   OSA on CPAP 05/29/2017   Plantar fasciitis 09/30/2015   Sleep apnea    Past Surgical History:  Procedure Laterality Date   ANAL FISSURE REPAIR     HAMMER TOE SURGERY Left 03/02/2022   2nd Left - Dr. Milinda Pointer   METATARSAL OSTEOTOMY Left 03/02/2022   2nd Left - Dr. 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   Vitiligo 09/09/2018   OSA on CPAP 05/29/2017   Low HDL (under 40) 11/08/2016   Spondylolysis of cervical region 08/08/2016   Family history of colonic polyps 03/14/2015   Gastroesophageal reflux disease with esophagitis without hemorrhage 03/14/2015   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 He has had ongoing pain in ball of foot along with pain at the top of 2nd met due to toe rubbing against the top of shoe because it is stuck in extension. He has tried self manipulations with no increased in toe flexion. 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 He has orthotics that he wears with his work boots, but he has not attempted wearing boots since having the procedure because he is afraid of the pain.   PRECAUTIONS: None   SUBJECTIVE: Pt reports feeling less pain in left foot even after playing dodgeball with his daughter and running.    PAIN:  Are you having pain? Yes: NPRS scale: 1-2/10 Pain location: Pain on the top of the 2nd digit. Pain description: Soreness  Aggravating factors: Wearing tight shoes or walking on foot Relieving factors:  Sitting still    OBJECTIVE: (objective measures completed at initial evaluation unless otherwise dated)  DIAGNOSTIC FINDINGS: CLINICAL DATA:  Foot trauma. Tendon injury or dislocation suspected. Evaluate hammertoe of the second digit with possible capsule tear of the second metacarpophalangeal joint. Surgical consideration.   EXAM: MRI OF THE LEFT FOOT WITHOUT CONTRAST   TECHNIQUE: Multiplanar, multisequence MR imaging of the left forefoot was performed. No intravenous contrast was administered.   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 Previously there was predominantly degenerative cystic change in this region, now with interval increase in surrounding edema (sagittal series 8 images 16 through 21). This edema is high-grade throughout the entire intermediate cuneiform (sagittal image 18).   Small great toe metatarsophalangeal joint and second metatarsophalangeal joint effusions. The plantar plates appear intact. No definite capsular tear is seen. There is moderate flexion positioning of the second through fourth PIP joints.   Mild medial and lateral great toe metatarsophalangeal joint sesamoid marrow edema likely degenerative change.   There appears to be moderate cartilage thinning and mild peripheral degenerative osteophytosis of the tarsometatarsal joints diffusely.   Ligaments   Lisfranc ligament complex appears intact.   Muscles and Tendons   The visualized flexor and extensor tendons appear intact.   Soft tissues   There is mild edema and swelling within the subcutaneous fat of the fourth and fifth toes.   IMPRESSION:: IMPRESSION: 1. Moderate flexion positioning of second through fourth PIP joints. No definite capsule tear is identified with attention to the second metatarsophalangeal joint. There are mild first and second metatarsophalangeal joint effusions. 2. 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 Diffuse marrow edema throughout the entire intermediate cuneiform may be secondary to stress related and/or degenerative changes, likely a source of pain within the midfoot.   PATIENT SURVEYS:  FOTO 62/100 with target of 70   COGNITION:           Overall cognitive status: Within functional limits for tasks assessed                          SENSATION: WFL   EDEMA: No swelling noted over left foot         POSTURE: No Significant postural limitations   PALPATION: Ventral surface of left foot on toe pad    LOWER EXTREMITY ROM: 2nd toe on left foot PROM 5 deg ext   GAIT: Distance walked: 40 ft  Assistive device utilized: None Level of assistance: Complete Independence Comments: Supination with lateral wearing of shoe tread        TODAY'S TREATMENT:  07/04/22  Manual Therapy  Transmetatarsal mobilizations  2nd digit traction with flexion and extension mobilizations. Grade 3 for mobility, 10 sec bouts. 2x10.  Myofascial release of dorsal/plantar surface of left foot  STM with TP release technique applied to the posterior gastroc/soleus complex  Plantar flexion stretch on left calf   06/27/22  Manual Therapy  Transmetatarsal mobilizations  2nd digit traction with flexion and extension mobilizations. Grade 3 for mobility, 10 sec bouts. 2x10.  Myofascial release of dorsal/plantar surface of left foot  STM with TP release technique applied to the posterior gastroc/soleus complex  06/25/22  Manual Therapy  Transmetatarsal mobilizations  2nd digit traction with flexion and extension mobilizations. Grade 3 for mobility, 10 sec bouts. 2x10.  Myofascial release of dorsal/plantar surface of left foot  STM with TP release technique applied to the posterior gastroc/soleus complex   There.ex:  Seated toe extension stretch, 30 sec bouts x4 Seated heel to toe slides for toe flexor stretch: 4x30 sec Seated foot doming:  2x12, max TC's on digits to maintain extension due to lack of motor control Standing towel scrunches with metatarsals, 2x15 reps of flexion       PATIENT EDUCATION:  Education details: form and technique for appropriate exercise  Person educated: Patient Education method: Explanation and Demonstration Education comprehension: verbalized understanding and returned demonstration     HOME EXERCISE PROGRAM: Access Code: [email or web address] URL:  [email or web address] Date: 05/24/2022 Prepared by: Bradly Chris  Exercises - Long Sitting Calf Stretch with Strap  - 1 x daily - 7 x weekly - 3 reps - 60 hold - Towel Scrunches  - 1 x daily - 7 x weekly - 3 sets - 10 reps - Single Leg Heel Raise with Chair Support  - 1 x daily - 3 x weekly - 3 sets - 10 reps -Full Kneel Toe Ext 3 x 30 sec x 7 days per week     ASSESSMENT:   CLINICAL IMPRESSION:  Pt exhibits improvement in left foot pain response to activity since last session and improve self perception of function. Pt will be seeing podiatrist in two weeks to follow up about progress of left foot. 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   OBJECTIVE IMPAIRMENTS pain, abnormal gait, and decreased ROM.    ACTIVITY LIMITATIONS locomotion level and walking long distances    PARTICIPATION LIMITATIONS: community activity and occupation   PERSONAL FACTORS Time since onset of injury/illness/exacerbation are also affecting patient's functional outcome.    REHAB POTENTIAL: Good   CLINICAL DECISION MAKING: Stable/uncomplicated   EVALUATION COMPLEXITY: Low     GOALS: Goals reviewed with patient? No   SHORT TERM GOALS: Target date: 05/31/2022  Pt will be independent with HEP in order to improve strength and balance in order to decrease fall risk and improve function at home and work. Baseline: Performing independently  Goal status: ACHIEVED    2.  Patient will demonstrate knowledge of self mobilizations by accurately performing self mobilizations with no cueing for improved outcomes for 2nd digit ROM.  Baseline: Able to perform independently  Goal status: ACHIEVED      LONG TERM GOALS: Target date: 07/26/2022    Patient will have improved function and activity level as evidenced by an increase in FOTO score by 10 points or more.  Baseline: 62/100  07/04/22: 65/100 Goal status:  ONGOING    2.  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  Baseline: NPS 3/10 06/27/22: NPS 3/10  Goal status: ONGOING          PLAN: PT FREQUENCY: 1-2x/week   PT DURATION: 10 weeks   PLANNED INTERVENTIONS: Therapeutic exercises, Therapeutic activity, Neuromuscular re-education, Balance training, Gait training, Patient/Family education, Self Care, Joint mobilization, Joint manipulation, Stair training, Orthotic/Fit training, Cryotherapy, Moist heat, scar mobilization, Manual therapy, and Re-evaluation   PLAN FOR NEXT SESSION: MFR of plantar surface of left foot    Bradly Chris PT, DPT  Physical Therapist- [facility]  07/04/2022, 9:34 AM

## 2022-07-10 ENCOUNTER — Ambulatory Visit: Payer: BC Managed Care – PPO | Admitting: Physical Therapy

## 2022-07-10 ENCOUNTER — Encounter: Payer: Self-pay | Admitting: Physical Therapy

## 2022-07-10 DIAGNOSIS — M79672 Pain in left foot: Secondary | ICD-10-CM | POA: Diagnosis not present

## 2022-07-10 DIAGNOSIS — R262 Difficulty in walking, not elsewhere classified: Secondary | ICD-10-CM

## 2022-07-10 NOTE — Therapy (Addendum)
OUTPATIENT PHYSICAL THERAPY PROGRESS NOTE  Discharge Summary: Patient has since elected to discharge from physical therapy due to need to return to physician for further eval and treatment.    Patient Name: Ryan Ritter MRN: [id number] DOB:10-09-1965, 56 y.o., male Today's Date: 07/10/2022  PCP: Dr. Steele Sizer  REFERRING PROVIDER: Dr. Tyson Dense   END OF SESSION:   PT End of Session - 07/10/22 1203     Visit Number 12    Number of Visits 15    Date for PT Re-Evaluation 07/26/22    Authorization Type BCBS    Progress Note Due on Visit 15    PT Start Time 1105    PT Stop Time 9326    PT Time Calculation (min) 40 min    Activity Tolerance Patient tolerated treatment well    Behavior During Therapy [facility] for tasks assessed/performed                 Past Medical History:  Diagnosis Date   Allergy    Benign essential HTN 01/29/2007   Diabetes mellitus without complication ([place])    Hyperlipidemia    Mass of left thigh 10/09/2018   Obesity 03/14/2015   OSA on CPAP 05/29/2017   Plantar fasciitis 09/30/2015   Sleep apnea    Past Surgical History:  Procedure Laterality Date   ANAL FISSURE REPAIR     HAMMER TOE SURGERY Left 03/02/2022   2nd Left - Dr. Milinda Pointer   METATARSAL OSTEOTOMY Left 03/02/2022   2nd Left - Dr. 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   Vitiligo 09/09/2018   OSA on CPAP 05/29/2017   Low HDL (under 40) 11/08/2016   Spondylolysis of cervical region 08/08/2016   Family history of colonic polyps 03/14/2015   Gastroesophageal reflux disease with esophagitis without hemorrhage 03/14/2015   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 He has had ongoing pain in ball of foot along with pain at the top of 2nd met due to toe rubbing against the top of shoe because it is stuck in extension. He has tried self manipulations with no increased in toe flexion. 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 He has orthotics that he wears with his work boots, but he has not attempted wearing boots since having the procedure because he is afraid of the pain.   PRECAUTIONS: None   SUBJECTIVE: Pt continues to report improved pain in left foot. He still feels some pain especially when placing weight on the ball of his foot.    PAIN:  Are you having pain? Yes: NPRS scale: 1-2/10 Pain location: Pain on the top of the 2nd digit. Pain description: Soreness  Aggravating factors: Wearing tight shoes or walking on foot Relieving factors: Sitting still    OBJECTIVE: (objective measures completed at initial evaluation unless otherwise dated)  DIAGNOSTIC FINDINGS: CLINICAL DATA:  Foot trauma. Tendon injury or dislocation suspected. Evaluate hammertoe of the second digit with possible capsule tear of the second metacarpophalangeal joint. Surgical consideration.   EXAM: MRI OF THE LEFT FOOT WITHOUT CONTRAST   TECHNIQUE: Multiplanar, multisequence MR imaging of the left forefoot was performed. No intravenous contrast was  administered.   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 Previously there was predominantly degenerative cystic change in this region, now with interval increase in surrounding edema (sagittal series 8 images 16 through 21). This edema is high-grade throughout the entire intermediate cuneiform (sagittal image 18).   Small great toe metatarsophalangeal joint and second metatarsophalangeal joint effusions. The plantar plates appear intact. No definite capsular tear is seen. There is moderate flexion positioning of the second through fourth PIP joints.   Mild medial and lateral great toe metatarsophalangeal joint sesamoid marrow edema likely degenerative change.   There appears to be moderate cartilage thinning and mild peripheral degenerative osteophytosis of the tarsometatarsal joints diffusely.   Ligaments   Lisfranc ligament complex appears intact.   Muscles and Tendons   The visualized flexor and extensor tendons appear intact.   Soft tissues   There is mild edema and swelling within the subcutaneous fat of the fourth and fifth toes.   IMPRESSION:: IMPRESSION: 1. Moderate flexion positioning of second through fourth PIP joints. No definite capsule tear is identified with attention to the second metatarsophalangeal joint. There are mild first and second metatarsophalangeal joint effusions. 2. 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 Diffuse marrow edema throughout the entire intermediate cuneiform may be secondary to stress related and/or degenerative changes, likely a source of pain within the midfoot.   PATIENT SURVEYS:  FOTO 62/100 with target of 70    COGNITION:           Overall cognitive status: Within functional limits for tasks assessed                          SENSATION: WFL   EDEMA: No swelling noted over left foot        POSTURE: No Significant postural limitations   PALPATION: Ventral surface of left foot on toe pad    LOWER EXTREMITY ROM: 2nd toe on left foot PROM 5 deg ext   GAIT: Distance walked: 40 ft  Assistive device utilized: None Level of assistance: Complete Independence Comments: Supination with lateral wearing of shoe tread        TODAY'S TREATMENT:  07/10/22: Manual Therapy  Transmetatarsal mobilizations  2nd digit traction with flexion and extension mobilizations. Grade 3 for mobility, 10 sec bouts. 2x10.  Myofascial release of dorsal/plantar surface of left foot  STM with TP release technique applied to the posterior gastroc/soleus complex  07/04/22  Manual Therapy  Transmetatarsal mobilizations  2nd digit traction with flexion and extension mobilizations. Grade 3 for mobility, 10 sec bouts. 2x10.  Myofascial release of dorsal/plantar surface of left foot  STM with TP release technique applied to the posterior gastroc/soleus complex  Plantar  flexion stretch on left calf   06/27/22  Manual Therapy  Transmetatarsal mobilizations  2nd digit traction with flexion and extension mobilizations. Grade 3 for mobility, 10 sec bouts. 2x10.  Myofascial release of dorsal/plantar surface of left foot  STM with TP release technique applied to the posterior gastroc/soleus complex  06/25/22  Manual Therapy  Transmetatarsal mobilizations  2nd digit traction with flexion and extension mobilizations. Grade 3 for mobility, 10 sec bouts. 2x10.  Myofascial release of dorsal/plantar surface of left foot  STM with TP release technique applied to the posterior gastroc/soleus complex   There.ex:  Seated toe extension stretch, 30 sec bouts x4 Seated heel to toe slides for toe flexor stretch: 4x30  sec Seated foot doming:  2x12, max TC's on digits to maintain extension due to lack of motor control Standing towel scrunches with metatarsals, 2x15 reps of flexion       PATIENT EDUCATION:  Education details: form and technique for appropriate exercise  Person educated: Patient Education method: Explanation and Demonstration Education comprehension: verbalized understanding and returned demonstration     HOME EXERCISE PROGRAM: Access Code: [email or web address] URL: [email or web address] Date: 05/24/2022 Prepared by: Bradly Chris  Exercises - Long Sitting Calf Stretch with Strap  - 1 x daily - 7 x weekly - 3 reps - 60 hold - Towel Scrunches  - 1 x daily - 7 x weekly - 3 sets - 10 reps - Single Leg Heel Raise with Chair Support  - 1 x daily - 3 x weekly - 3 sets - 10 reps -Full Kneel Toe Ext 3 x 30 sec x 7 days per week     ASSESSMENT:   CLINICAL IMPRESSION:  Pt exhibits improvement in left foot pain response to activity since last session and improve self perception of function. Pt will be seeing podiatrist in two weeks to follow up about progress of left foot. 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   OBJECTIVE IMPAIRMENTS pain, abnormal gait, and decreased ROM.    ACTIVITY LIMITATIONS locomotion level and walking long distances    PARTICIPATION LIMITATIONS: community activity and occupation   PERSONAL FACTORS Time since onset of injury/illness/exacerbation are also affecting patient's functional outcome.    REHAB POTENTIAL: Good   CLINICAL DECISION MAKING: Stable/uncomplicated   EVALUATION COMPLEXITY: Low     GOALS: Goals reviewed with patient? No   SHORT TERM GOALS: Target date: 05/31/2022  Pt will be independent with HEP in order to improve strength and balance in order to decrease fall risk and improve function at home and work. Baseline: Performing independently   Goal status: ACHIEVED    2.  Patient will demonstrate knowledge of self mobilizations by accurately performing self mobilizations with no cueing for improved outcomes for 2nd digit ROM.  Baseline: Able to perform independently  Goal status: ACHIEVED      LONG TERM GOALS: Target date: 07/26/2022    Patient will have improved function and activity level as evidenced by an increase in FOTO score by 10 points or more.  Baseline: 62/100  07/04/22: 65/100 Goal status: Not Met    2.  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  Baseline: NPS 3/10 06/27/22: NPS 3/10  Goal status: Achieved          PLAN: PT FREQUENCY: 1-2x/week  PT DURATION: 10 weeks   PLANNED INTERVENTIONS: Therapeutic exercises, Therapeutic activity, Neuromuscular re-education, Balance training, Gait training, Patient/Family education, Self Care, Joint mobilization, Joint manipulation, Stair training, Orthotic/Fit training, Cryotherapy, Moist heat, scar mobilization, Manual therapy, and Re-evaluation   PLAN FOR NEXT SESSION: Discharge from PT if prognosis remains unchanged from podiatry note    Bradly Chris PT, DPT  Physical Therapist- [facility]  07/10/2022, 12:08 PM

## 2022-07-16 ENCOUNTER — Encounter: Payer: Self-pay | Admitting: Podiatry

## 2022-07-16 ENCOUNTER — Other Ambulatory Visit: Payer: Self-pay | Admitting: Podiatry

## 2022-07-16 ENCOUNTER — Ambulatory Visit (INDEPENDENT_AMBULATORY_CARE_PROVIDER_SITE_OTHER): Payer: BC Managed Care – PPO

## 2022-07-16 ENCOUNTER — Ambulatory Visit: Payer: BC Managed Care – PPO | Admitting: Podiatry

## 2022-07-16 DIAGNOSIS — M21962 Unspecified acquired deformity of left lower leg: Secondary | ICD-10-CM | POA: Diagnosis not present

## 2022-07-16 DIAGNOSIS — Z9889 Other specified postprocedural states: Secondary | ICD-10-CM

## 2022-07-16 DIAGNOSIS — M2042 Other hammer toe(s) (acquired), left foot: Secondary | ICD-10-CM

## 2022-07-16 DIAGNOSIS — T8460XA Infection and inflammatory reaction due to internal fixation device of unspecified site, initial encounter: Secondary | ICD-10-CM

## 2022-07-16 NOTE — Progress Notes (Signed)
He presents today for a follow-up of his painful second metatarsophalangeal joint of his left foot.  After an osteotomy performed back in June he states that he still having pain and numbness and funny sensation beneath the second metatarsophalangeal joint even after weeks of physical therapy.  Objective: Vital signs stable alert oriented x3 with intact will retract as though he does have swelling beneath joint.  There is no pain on dorsiflexion or plantarflexion of the metatarsal phalangeal joint only with direct palpation beneath the metatarsal head.  Radiographs taken today do demonstrate 2 screws that very well may be the culprit of his pain.  It appears that the threads of the screw are 1-1.5 thread lengths past the cortex.  Assessment: Painful internal fixation.  Plan: Consented him today for removal of screws second metatarsal left foot.  At this point I would like to allow him to go back to work full duty

## 2022-07-18 ENCOUNTER — Ambulatory Visit: Payer: BC Managed Care – PPO | Admitting: Physical Therapy

## 2022-07-24 ENCOUNTER — Encounter: Payer: BC Managed Care – PPO | Admitting: Physical Therapy

## 2022-08-15 ENCOUNTER — Telehealth: Payer: Self-pay | Admitting: Podiatry

## 2022-08-15 NOTE — Telephone Encounter (Signed)
DOS: 09/14/2022  [employer] State  Removal Fixation Deep Kwire/Screw 2nd Lt (20680)  DX: Z48.89  Deductible: $1,250 with $0 remaining Out-of-Pocket: $4,890 with $435.96 remaining CoInsurance: 20%  Prior authorization is not required per Ervin Knack  Call Reference#:[id number]

## 2022-09-05 ENCOUNTER — Encounter: Payer: Self-pay | Admitting: Podiatry

## 2022-09-06 ENCOUNTER — Encounter: Payer: Self-pay | Admitting: Podiatry

## 2022-09-13 ENCOUNTER — Other Ambulatory Visit: Payer: Self-pay | Admitting: Podiatry

## 2022-09-13 MED ORDER — CEPHALEXIN 500 MG PO CAPS: 500.0000 mg | ORAL_CAPSULE | Freq: Three times a day (TID) | ORAL | 0 refills | Status: DC

## 2022-09-13 MED ORDER — HYDROCODONE-ACETAMINOPHEN 10-325 MG PO TABS: 1.0000 | ORAL_TABLET | Freq: Four times a day (QID) | ORAL | 0 refills | Status: AC | PRN

## 2022-09-14 DIAGNOSIS — Z4889 Encounter for other specified surgical aftercare: Secondary | ICD-10-CM | POA: Diagnosis not present

## 2022-09-20 ENCOUNTER — Ambulatory Visit (INDEPENDENT_AMBULATORY_CARE_PROVIDER_SITE_OTHER): Payer: BC Managed Care – PPO | Admitting: Podiatry

## 2022-09-20 ENCOUNTER — Encounter: Payer: Self-pay | Admitting: Podiatry

## 2022-09-20 ENCOUNTER — Ambulatory Visit (INDEPENDENT_AMBULATORY_CARE_PROVIDER_SITE_OTHER): Payer: BC Managed Care – PPO

## 2022-09-20 DIAGNOSIS — M778 Other enthesopathies, not elsewhere classified: Secondary | ICD-10-CM

## 2022-09-20 NOTE — Progress Notes (Signed)
He presents today for his postop visit date of surgery 01/13/2022 removal of screws second metatarsophalangeal joint with releases of metatarsophalangeal joint states that is feeling pretty great.  Objective: Vital signs stable alert and oriented x 3 sterile dressing intact was removed demonstrates sutures intact margins well coapted minimal edema of the foot there is just some right under the incision line.  His toe is sitting rectus  Radiographs taken today demonstrate osseously mature individual with good mineralization screws were going to the second metatarsal head the joint space is visible the toe is sitting rectus.  No other acute abnormalities are identified.  Assessment: Well-healing surgical toes and forefoot.  Redressed today dressed a compressive dressing with him back in his [place] shoe and I will follow-up with him in 1 week for suture removal.

## 2022-09-27 ENCOUNTER — Ambulatory Visit (INDEPENDENT_AMBULATORY_CARE_PROVIDER_SITE_OTHER): Payer: BC Managed Care – PPO | Admitting: Podiatry

## 2022-09-27 ENCOUNTER — Encounter: Payer: Self-pay | Admitting: Podiatry

## 2022-09-27 DIAGNOSIS — Z9889 Other specified postprocedural states: Secondary | ICD-10-CM

## 2022-09-27 DIAGNOSIS — T8460XA Infection and inflammatory reaction due to internal fixation device of unspecified site, initial encounter: Secondary | ICD-10-CM

## 2022-09-27 NOTE — Progress Notes (Signed)
Please me he presents today for postop visit date of surgery was 09/14/2022 removal of screws second metatarsophalangeal joint left foot he states that it is felt really good this time.  He denies fever chills nausea vomit muscle aches and pains.  Objective: Pulses are intact margins of the incision site are intact sutures are intact no dehiscence.  Assessment: Well-healing surgical foot.  Plan: Allow him to back to work next Tuesday get back into regular shoes now and I will follow-up with him in 2 weeks

## 2022-09-28 ENCOUNTER — Ambulatory Visit: Payer: BC Managed Care – PPO | Admitting: Family Medicine

## 2022-10-11 ENCOUNTER — Encounter: Payer: BC Managed Care – PPO | Admitting: Podiatry

## 2022-10-11 ENCOUNTER — Ambulatory Visit (INDEPENDENT_AMBULATORY_CARE_PROVIDER_SITE_OTHER): Payer: BC Managed Care – PPO | Admitting: Podiatry

## 2022-10-11 DIAGNOSIS — T8460XA Infection and inflammatory reaction due to internal fixation device of unspecified site, initial encounter: Secondary | ICD-10-CM

## 2022-10-11 DIAGNOSIS — Z9889 Other specified postprocedural states: Secondary | ICD-10-CM

## 2022-10-11 NOTE — Progress Notes (Signed)
He presents today status post removal internal fixation release second metatarsal phalangeal joint left foot.  States that he still feels a little odd occasionally.  Objective: Vital signs stable alert oriented x 3 there is no erythema mild edema no cellulitis drainage or odor to the ED for a little hematoma beneath the incision site.  Incision site has some mild dehiscence of the epidermis but there is no bleeding present.  Assessment: Well-healing surgical foot.  Plan: Follow-up with me as needed basis

## 2022-10-25 ENCOUNTER — Ambulatory Visit (INDEPENDENT_AMBULATORY_CARE_PROVIDER_SITE_OTHER): Payer: BC Managed Care – PPO | Admitting: Podiatry

## 2022-10-25 ENCOUNTER — Encounter: Payer: Self-pay | Admitting: Podiatry

## 2022-10-25 ENCOUNTER — Encounter: Payer: BC Managed Care – PPO | Admitting: Podiatry

## 2022-10-25 DIAGNOSIS — T8460XA Infection and inflammatory reaction due to internal fixation device of unspecified site, initial encounter: Secondary | ICD-10-CM

## 2022-10-25 DIAGNOSIS — Z9889 Other specified postprocedural states: Secondary | ICD-10-CM

## 2022-10-25 NOTE — Progress Notes (Signed)
Ryan Ritter presents today date of surgery 09/14/2022 removal of screws from the second metatarsal phalangeal joint.  He states that is doing good no problems whatsoever.  Objective: Vital signs stable he is alert oriented x 3 still has some thickness in his surgical foot but does not appear to be problematic.  Assessment: Well-healing surgical foot.  Plan: Follow-up with me as needed

## 2022-11-02 ENCOUNTER — Ambulatory Visit: Payer: BC Managed Care – PPO | Admitting: Family Medicine

## 2022-12-19 ENCOUNTER — Other Ambulatory Visit: Payer: Self-pay | Admitting: Family Medicine

## 2022-12-19 DIAGNOSIS — E1169 Type 2 diabetes mellitus with other specified complication: Secondary | ICD-10-CM

## 2022-12-20 ENCOUNTER — Encounter: Payer: BC Managed Care – PPO | Admitting: Family Medicine

## 2022-12-20 NOTE — Telephone Encounter (Signed)
Vm not set up was not able to reach pt to schedule appt

## 2022-12-26 ENCOUNTER — Other Ambulatory Visit: Payer: Self-pay

## 2022-12-26 DIAGNOSIS — D696 Thrombocytopenia, unspecified: Secondary | ICD-10-CM

## 2022-12-27 ENCOUNTER — Inpatient Hospital Stay: Payer: BC Managed Care – PPO | Attending: Oncology

## 2022-12-27 DIAGNOSIS — D696 Thrombocytopenia, unspecified: Secondary | ICD-10-CM | POA: Insufficient documentation

## 2022-12-27 DIAGNOSIS — R7989 Other specified abnormal findings of blood chemistry: Secondary | ICD-10-CM | POA: Diagnosis not present

## 2022-12-27 LAB — CBC WITH DIFFERENTIAL/PLATELET
Abs Immature Granulocytes: 0.05 10*3/uL (ref 0.00–0.07)
Basophils Absolute: 0.1 10*3/uL (ref 0.0–0.1)
Basophils Relative: 1 %
Eosinophils Absolute: 0.3 10*3/uL (ref 0.0–0.5)
Eosinophils Relative: 4 %
HCT: 40.4 % (ref 39.0–52.0)
Hemoglobin: 14.3 g/dL (ref 13.0–17.0)
Immature Granulocytes: 1 %
Lymphocytes Relative: 20 %
Lymphs Abs: 1.2 10*3/uL (ref 0.7–4.0)
MCH: 30.7 pg (ref 26.0–34.0)
MCHC: 35.4 g/dL (ref 30.0–36.0)
MCV: 86.7 fL (ref 80.0–100.0)
Monocytes Absolute: 0.5 10*3/uL (ref 0.1–1.0)
Monocytes Relative: 9 %
Neutro Abs: 3.9 10*3/uL (ref 1.7–7.7)
Neutrophils Relative %: 65 %
Platelets: 152 10*3/uL (ref 150–400)
RBC: 4.66 MIL/uL (ref 4.22–5.81)
RDW: 12.2 % (ref 11.5–15.5)
WBC: 5.9 10*3/uL (ref 4.0–10.5)
nRBC: 0 % (ref 0.0–0.2)

## 2022-12-27 LAB — COMPREHENSIVE METABOLIC PANEL
ALT: 20 U/L (ref 0–44)
AST: 17 U/L (ref 15–41)
Albumin: 4.1 g/dL (ref 3.5–5.0)
Alkaline Phosphatase: 46 U/L (ref 38–126)
Anion gap: 7 (ref 5–15)
BUN: 35 mg/dL — ABNORMAL HIGH (ref 6–20)
CO2: 25 mmol/L (ref 22–32)
Calcium: 8.6 mg/dL — ABNORMAL LOW (ref 8.9–10.3)
Chloride: 103 mmol/L (ref 98–111)
Creatinine, Ser: 1.35 mg/dL — ABNORMAL HIGH (ref 0.61–1.24)
GFR, Estimated: >60 mL/min (ref >60)
Glucose, Bld: 162 mg/dL — ABNORMAL HIGH (ref 70–99)
Potassium: 3.8 mmol/L (ref 3.5–5.1)
Sodium: 135 mmol/L (ref 135–145)
Total Bilirubin: 0.4 mg/dL (ref 0.3–1.2)
Total Protein: 6.8 g/dL (ref 6.5–8.1)

## 2022-12-27 LAB — VITAMIN B12: Vitamin B-12: 561 pg/mL (ref 180–914)

## 2022-12-27 LAB — TECHNOLOGIST SMEAR REVIEW
Plt Morphology: NORMAL
RBC MORPHOLOGY: NORMAL
WBC MORPHOLOGY: NORMAL

## 2023-01-01 ENCOUNTER — Inpatient Hospital Stay: Payer: BC Managed Care – PPO | Attending: Oncology | Admitting: Oncology

## 2023-01-01 ENCOUNTER — Encounter: Payer: Self-pay | Admitting: Oncology

## 2023-01-01 VITALS — BP 133/68 | HR 66 | Temp 97.1°F | Resp 18 | Wt 243.8 lb

## 2023-01-01 DIAGNOSIS — Z87891 Personal history of nicotine dependence: Secondary | ICD-10-CM | POA: Diagnosis not present

## 2023-01-01 DIAGNOSIS — R7989 Other specified abnormal findings of blood chemistry: Secondary | ICD-10-CM | POA: Insufficient documentation

## 2023-01-01 DIAGNOSIS — D696 Thrombocytopenia, unspecified: Secondary | ICD-10-CM | POA: Insufficient documentation

## 2023-01-01 DIAGNOSIS — E119 Type 2 diabetes mellitus without complications: Secondary | ICD-10-CM | POA: Insufficient documentation

## 2023-01-01 DIAGNOSIS — Z79899 Other long term (current) drug therapy: Secondary | ICD-10-CM | POA: Diagnosis not present

## 2023-01-01 DIAGNOSIS — Z794 Long term (current) use of insulin: Secondary | ICD-10-CM | POA: Diagnosis not present

## 2023-01-01 DIAGNOSIS — Z7984 Long term (current) use of oral hypoglycemic drugs: Secondary | ICD-10-CM | POA: Diagnosis not present

## 2023-01-01 NOTE — Assessment & Plan Note (Signed)
Encourage oral hydration and avoid nephrotoxins.  Recommend patient to have repeat kidney function with PCP in 3-6 months.

## 2023-01-01 NOTE — Progress Notes (Signed)
Hematology/Oncology Consult Note [phone] [phone]    CHIEF COMPLAINTS/REASON FOR VISIT:   thrombocytopenia  ASSESSMENT & PLAN:   Thrombocytopenia (HCC) Previous lab work up  normal vitamin B12, normal folate, normal immature platelet fraction, negative HIV, negative hepatitis panel,normal LDH, negative peripheral blood flow cytometry, negative M protein on multiple myeloma panel, normal light chain ratio.  Labs are reviewed and discussed with patient. CBC showed platelet count has improved and normalization.  No intervention needed.    Low vitamin B12 level Continue oral vitamin B12 supplementation - twice per week  Hypocalcemia Recommend calcium and vitamin D supplementation.  Repeat levels in 3-6 months with PCP  Elevated serum creatinine Encourage oral hydration and avoid nephrotoxins.  Recommend patient to have repeat kidney function with PCP in 3-6 months.   Patient is discharged from my clinic. I recommend patient to continue follow up with primary care physician. Patient may re-establish care in the future if clinically indicated.  All questions were answered. The patient knows to call the clinic with any problems, questions or concerns.  Earlie Server, MD, PhD [facility] Hematology Oncology 01/01/2023   HISTORY OF PRESENTING ILLNESS:  Ryan Ritter is a 57 y.o. male who was seen in consultation at the request of Steele Sizer, MD for evaluation of thrombocytopenia   12/05/2021 patient had abnormal Labs which showed decreased platelet counts 131,000.  Reviewed patient's previous labs. Thrombocytopenia is chronic chronic onset , since at least 6 years ago Stable reticulocyte count 130,000s range Denies weight loss, fever, chills, fatigue, occasionally he has night sweats.  Denies hematochezia, hematuria, hematemesis, epistaxis, black tarry stool.  Patient has no easy bruising.   Patient denies any alcohol use He works as a  Surveyor, minerals.   INTERVAL HISTORY Ryan Ritter is a 57 y.o. male who has above history reviewed by me today presents for follow up visit for thrombocytopenia, low B12 levels.   - Kidney function acutely worse compared to baseline; creatinine level acutely elevated to 1.35. No recent new medications except occasional ibuprofen for headaches. - Experiencing toothache for the last couple of months due to an infection, with a dental appointment scheduled for tooth extraction. - Reports high blood sugar levels, possibly related to toothache stress. - Diagnosed with diabetes, currently taking Metformin, no recent changes in diabetes medication. - Vitamin B12 levels improved, attributed to dietary intake of beef liver once or twice a week. - History of low calcium levels, experiencing leg cramps.     MEDICAL HISTORY:  Past Medical History:  Diagnosis Date   Allergy    Benign essential HTN 01/29/2007   Diabetes mellitus without complication    Hyperlipidemia    Mass of left thigh 10/09/2018   Obesity 03/14/2015   OSA on CPAP 05/29/2017   Plantar fasciitis 09/30/2015   Sleep apnea     SURGICAL HISTORY: Past Surgical History:  Procedure Laterality Date   ANAL FISSURE REPAIR     HAMMER TOE SURGERY Left 03/02/2022   2nd Left - Dr. Milinda Pointer   METATARSAL OSTEOTOMY Left 03/02/2022   2nd Left - Dr. Milinda Pointer   tendinitis Left    TRIGGER FINGER RELEASE     vascectomy  08/2014    SOCIAL HISTORY: Social History   Socioeconomic History   Marital status: Married    Spouse name: Not on file   Number of children: Not on file   Years of education: Not on file   Highest education level: Not on file  Occupational History  Not on file  Tobacco Use   Smoking status: Never   Smokeless tobacco: Former    Types: Chew    Quit date: 10/01/1988  Vaping Use   Vaping Use: Never used  Substance and Sexual Activity   Alcohol use: No    Alcohol/week: 0.0 standard drinks of alcohol   Drug use: No   Sexual  activity: Yes    Partners: Female  Other Topics Concern   Not on file  Social History Narrative   Not on file   Social Determinants of Health   Financial Resource Strain: Low Risk  (12/12/2021)   Overall Financial Resource Strain (CARDIA)    Difficulty of Paying Living Expenses: Not hard at all  Food Insecurity: No Food Insecurity (12/12/2021)   Hunger Vital Sign    Worried About Running Out of Food in the Last Year: Never true    Ran Out of Food in the Last Year: Never true  Transportation Needs: No Transportation Needs (12/12/2021)   PRAPARE - Hydrologist (Medical): No    Lack of Transportation (Non-Medical): No  Physical Activity: Insufficiently Active (12/12/2021)   Exercise Vital Sign    Days of Exercise per Week: 7 days    Minutes of Exercise per Session: 20 min  Stress: Stress Concern Present (12/12/2021)   [place]    Feeling of Stress : To some extent  Social Connections: Socially Integrated (12/12/2021)   Social Connection and Isolation Panel [NHANES]    Frequency of Communication with Friends and Family: More than three times a week    Frequency of Social Gatherings with Friends and Family: Once a week    Attends Religious Services: More than 4 times per year    Active Member of [employer] or Organizations: Yes    Attends Music therapist: More than 4 times per year    Marital Status: Married  Human resources officer Violence: Not At Risk (12/12/2021)   Humiliation, Afraid, Rape, and Kick questionnaire    Fear of Current or Ex-Partner: No    Emotionally Abused: No    Physically Abused: No    Sexually Abused: No    FAMILY HISTORY: Family History  Problem Relation Age of Onset   Breast cancer Mother    Colon polyps Mother    Diabetes Father    Pancreatic cancer Father    Diabetes Brother    Diabetes Maternal Grandmother    Cancer Maternal Grandfather        leukemia    Leukemia Maternal Grandfather    Cancer Paternal Grandfather        multiple myeloma   Multiple myeloma Paternal Grandfather     ALLERGIES:  is allergic to benicar [olmesartan] and lisinopril.  MEDICATIONS:  Current Outpatient Medications  Medication Sig Dispense Refill   Accu-Chek Softclix Lancets lancets SMARTSIG:2 Topical Twice Daily     amoxicillin (AMOXIL) 250 MG capsule Take 250 mg by mouth 3 (three) times daily.     Boron 3 MG CAPS Take 1 capsule by mouth 3 (three) times daily.     cephALEXin (KEFLEX) 500 MG capsule Take 1 capsule (500 mg total) by mouth 3 (three) times daily. (Patient not taking: Reported on 01/01/2023) 30 capsule 0   Chromium 200 MCG CAPS Take 200 mcg by mouth 2 (two) times daily.      Cinnamon 500 MG TABS Take 1,000 tablets by mouth 2 (two) times daily.  Cod Liver Oil 5000-500 UNIT/5ML OIL Take by mouth.     Coenzyme Q10 (CO Q10) 200 MG CAPS Take 200 mg by mouth daily.     Continuous Blood Gluc Sensor (DEXCOM G7 SENSOR) MISC 1 each by Does not apply route as directed. Apply one every 10 days 9 each 1   fluticasone (FLONASE) 50 MCG/ACT nasal spray 2 sprays in each nostril Once a day Nasally 30 days     glucose blood (ACCU-CHEK GUIDE) test strip USE TO TEST UP TO 4 TIMES A DAY 100 strip 2   Insulin Pen Needle 32G X 4 MM MISC 1 each by Does not apply route daily at 12 noon. Twice daily prn 100 each 2   levocetirizine (XYZAL) 5 MG tablet Take 5 mg by mouth every evening.      metFORMIN (GLUCOPHAGE-XR) 750 MG 24 hr tablet Take 1,500 mg by mouth daily with breakfast.     NOVOLOG FLEXPEN 100 UNIT/ML FlexPen Inject 6-14 Units into the skin 2 (two) times daily as needed.     EPINEPHrine 0.3 mg/0.3 mL IJ SOAJ injection epinephrine 0.3 mg/0.3 mL injection, auto-injector (Patient not taking: Reported on 06/01/2022)     No current facility-administered medications for this visit.    Review of Systems  Constitutional:  Negative for appetite change, chills, fatigue,  fever and unexpected weight change.  HENT:   Negative for hearing loss and voice change.   Eyes:  Negative for eye problems and icterus.  Respiratory:  Negative for chest tightness, cough and shortness of breath.   Cardiovascular:  Negative for chest pain and leg swelling.  Gastrointestinal:  Negative for abdominal distention and abdominal pain.  Endocrine: Negative for hot flashes.  Genitourinary:  Negative for difficulty urinating, dysuria and frequency.   Musculoskeletal:  Negative for arthralgias.  Skin:  Negative for itching and rash.  Neurological:  Negative for light-headedness and numbness.  Hematological:  Negative for adenopathy. Does not bruise/bleed easily.  Psychiatric/Behavioral:  Negative for confusion.     PHYSICAL EXAMINATION: ECOG PERFORMANCE STATUS: 0 - Asymptomatic Vitals:   01/01/23 1458  BP: 133/68  Pulse: 66  Resp: 18  Temp: (!) 97.1 F (36.2 C)  SpO2: 99%   Filed Weights   01/01/23 1458  Weight: 243 lb 12.8 oz (110.6 kg)    Physical Exam Constitutional:      General: He is not in acute distress. HENT:     Head: Normocephalic and atraumatic.  Eyes:     General: No scleral icterus. Cardiovascular:     Rate and Rhythm: Normal rate and regular rhythm.     Heart sounds: Normal heart sounds.  Pulmonary:     Effort: Pulmonary effort is normal. No respiratory distress.     Breath sounds: No wheezing.  Abdominal:     General: Bowel sounds are normal. There is no distension.     Palpations: Abdomen is soft.  Musculoskeletal:        General: No deformity. Normal range of motion.     Cervical back: Normal range of motion and neck supple.  Skin:    General: Skin is warm and dry.     Findings: No erythema or rash.  Neurological:     Mental Status: He is alert and oriented to person, place, and time. Mental status is at baseline.     Cranial Nerves: No cranial nerve deficit.  Psychiatric:        Mood and Affect: Mood normal.      LABORATORY  DATA:   I have reviewed the data as listed    Latest Ref Rng & Units 12/27/2022    3:55 PM 06/01/2022   10:18 AM 12/05/2021    8:30 AM  CBC  WBC 4.0 - 10.5 K/uL 5.9  4.2  4.0   Hemoglobin 13.0 - 17.0 g/dL 14.3  14.5  14.7   Hematocrit 39.0 - 52.0 % 40.4  41.8  43.8   Platelets 150 - 400 K/uL 152  143  131       Latest Ref Rng & Units 12/27/2022    3:55 PM 12/05/2021    8:30 AM 01/12/2021   11:23 AM  CMP  Glucose 70 - 99 mg/dL 162  334  192   BUN 6 - 20 mg/dL 35  19  17   Creatinine 0.61 - 1.24 mg/dL 1.35  0.86  0.85   Sodium 135 - 145 mmol/L 135  135  138   Potassium 3.5 - 5.1 mmol/L 3.8  4.6  4.4   Chloride 98 - 111 mmol/L 103  102  103   CO2 22 - 32 mmol/L 25  24  28    Calcium 8.9 - 10.3 mg/dL 8.6  8.9  9.1   Total Protein 6.5 - 8.1 g/dL 6.8  6.5  6.6   Total Bilirubin 0.3 - 1.2 mg/dL 0.4  0.6  1.0   Alkaline Phos 38 - 126 U/L 46     AST 15 - 41 U/L 17  15  13    ALT 0 - 44 U/L 20  23  21       RADIOGRAPHIC STUDIES: I have personally reviewed the radiological images as listed and agreed with the findings in the report.

## 2023-01-01 NOTE — Assessment & Plan Note (Addendum)
Previous lab work up  normal vitamin B12, normal folate, normal immature platelet fraction, negative HIV, negative hepatitis panel,normal LDH, negative peripheral blood flow cytometry, negative M protein on multiple myeloma panel, normal light chain ratio.  Labs are reviewed and discussed with patient. CBC showed platelet count has improved and normalization.  No intervention needed.

## 2023-01-01 NOTE — Assessment & Plan Note (Signed)
Recommend calcium and vitamin D supplementation.  Repeat levels in 3-6 months with PCP

## 2023-01-01 NOTE — Assessment & Plan Note (Addendum)
Continue oral vitamin B12 supplementation - twice per week

## 2023-01-16 LAB — LAB REPORT - SCANNED: A1c: 6.4

## 2023-01-23 ENCOUNTER — Ambulatory Visit: Payer: BC Managed Care – PPO | Admitting: Family Medicine

## 2023-03-11 NOTE — Progress Notes (Unsigned)
Name: Ryan Ritter   MRN: [id number]    DOB: May 04, 1966   Date:03/12/2023       Progress Note  Subjective  Chief Complaint  Swollen Lymph Nodes  HPI  DMII: since March 2023  he has been using a  CGM  his glucose is going down.  He is now seeing Dr. Renae Fickle and off Evaristo Bury, taking novalog in am's and sometimes in the evening, and Metformin 1500 mg daily, he tried Ozempic but could not tolerate medication . He could not tolerate Rosuvastatin - affected his memory. Last A1C was done 12/2022 and it was at goal at 6.4 %    OSA: he is using CPAP machine,  he does not snore when he uses the CPAP, he still feels tired when he wakes up, however he does not go to bed early, he only sleeps on average 6 hours per night . He tries to sleep more on weekends    Obesity:weight is stable. He tries to eat healthy and following a diabetic diet   Thrombocytopenia: reviewed previous labs with him, it has been hovering in 39's  Paternal grandfather had Multiple Myeloma and Maternal grandfather had leukemia. Seen by Dr. Cathie Hoops and levels normalized   B12 : improved   Lymphadenopathy right inguinal : he noticed a rash on his penis and scrotum 3 weeks ago , initially very painful, he had an MRI done for evaluation of right hip pain after a fall while running and it showed lymphadenopathy. He states it is going down in size. His wife had herpes type 2 prior to their marriage but he never had an outbreak before.   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 Mayford Knife at [facility] and will have PT . Worse pain is on buttocks   Viral illness: he states two days ago he had fatigue, followed by burning sensation in his eyes, mild sore throat, yesterday felt very tired and fever 101.5 , and spiked again last night and headache. 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 Al Corpus   METATARSAL OSTEOTOMY Left 03/02/2022   2nd Left - Dr. Al Corpus   tendinitis Left    TRIGGER FINGER RELEASE     vascectomy  08/2014    Family History  Problem Relation Age of Onset   Breast cancer Mother    Colon polyps Mother    Diabetes Father    Pancreatic cancer Father    Diabetes Brother    Diabetes Maternal Grandmother    Cancer Maternal Grandfather  leukemia   Leukemia Maternal Grandfather    Cancer Paternal Grandfather        multiple myeloma   Multiple myeloma Paternal Grandfather     Social History   Tobacco Use   Smoking status: Never   Smokeless tobacco: Former    Types: Chew    Quit date: 10/01/1988  Substance Use Topics   Alcohol use: No    Alcohol/week: 0.0 standard drinks of alcohol     Current Outpatient Medications:    Accu-Chek Softclix Lancets lancets, SMARTSIG:2 Topical Twice Daily, Disp: , Rfl:    Boron 3 MG CAPS, Take 1 capsule by mouth 3 (three) times daily., Disp: , Rfl:    Chromium 200 MCG CAPS, Take 200 mcg by mouth 2 (two) times daily. , Disp: , Rfl:    Cinnamon 500 MG TABS, Take 1,000 tablets by mouth 2 (two) times daily.,  Disp: , Rfl:    Cod Liver Oil 5000-500 UNIT/5ML OIL, Take by mouth., Disp: , Rfl:    Coenzyme Q10 (CO Q10) 200 MG CAPS, Take 200 mg by mouth daily., Disp: , Rfl:    Continuous Blood Gluc Sensor (DEXCOM G7 SENSOR) MISC, 1 each by Does not apply route as directed. Apply one every 10 days, Disp: 9 each, Rfl: 1   fluconazole (DIFLUCAN) 150 MG tablet, Take 1 tablet (150 mg total) by mouth every other day., Disp: 3 tablet, Rfl: 0   glucose blood (ACCU-CHEK GUIDE) test strip, USE TO TEST UP TO 4 TIMES A DAY, Disp: 100 strip, Rfl: 2   Insulin Pen Needle 32G X 4 MM MISC, 1 each by Does not apply route daily at 12 noon. Twice daily prn, Disp: 100 each, Rfl: 2   ketoconazole (NIZORAL) 2 % cream, Apply 1 Application topically 2 (two) times daily as needed for irritation. Groin rash, Disp: 60 g, Rfl: 0   levocetirizine (XYZAL) 5 MG tablet, Take 5 mg by mouth every evening. , Disp: , Rfl:    metFORMIN (GLUCOPHAGE-XR) 750 MG 24 hr tablet, Take 1,500 mg by mouth daily with breakfast., Disp: , Rfl:    NOVOLOG FLEXPEN 100 UNIT/ML FlexPen, Inject 6-14 Units into the skin 2 (two) times daily as needed., Disp: , Rfl:    valACYclovir (VALTREX) 500 MG tablet, Take 1 tablet (500 mg total) by mouth 3 (three) times daily. Prn, Disp: 30 tablet, Rfl: 0   EPINEPHrine 0.3 mg/0.3 mL IJ SOAJ injection, epinephrine 0.3 mg/0.3 mL injection, auto-injector (Patient not taking: Reported on 06/01/2022), Disp: , Rfl:    fluticasone (FLONASE) 50 MCG/ACT nasal spray, 2 sprays in each nostril Once a day Nasally 30 days (Patient not taking: Reported on 03/12/2023), Disp: , Rfl:   Allergies  Allergen Reactions   Benicar [Olmesartan] Other (See Comments)   Lisinopril Other (See Comments)    I personally reviewed active problem list, medication list, allergies, family history, social history, health maintenance with the patient/caregiver today.   ROS  Ten systems reviewed and is negative except as mentioned in HPI   Objective  Vitals:    03/12/23 1338  BP: 124/66  Pulse: 70  Resp: 16  SpO2: 97%  Weight: 241 lb (109.3 kg)  Height: 6' (1.829 m)    Body mass index is 32.69 kg/m.  Physical Exam  Constitutional: Patient appears well-developed and well-nourished. Obese  No distress.  HEENT: head atraumatic, normocephalic, pupils equal and reactive to light, ears normal TM, neck supple, throat within normal limits Cardiovascular: Normal rate, regular rhythm and normal  heart sounds.  No murmur heard. No BLE edema. Pulmonary/Chest: Effort normal and breath sounds normal. No respiratory distress. Abdominal: Soft.  There is no tenderness. Right slightly tender inguinal lymphadenopathy Genitourinary: cluster of ulceration on base of penis and also one on the gland , erythema of groin worse on right side, some maceration on groin crease  Psychiatric: Patient has a normal mood and affect. behavior is normal. Judgment and thought content normal.   Recent Results (from the past 2160 hour(s))  Technologist smear review     Status: None   Collection Time: 12/27/22  3:53 PM  Result Value Ref Range   WBC MORPHOLOGY Normal RBC, WBC, and platelet    RBC MORPHOLOGY Normal RBC, WBC, and platelet    Plt Morphology Normal RBC, WBC, and platelet    Clinical Information thrombocytopenia     Comment: Performed at [facility], [address] [place], [place] [address]  Vitamin B12     Status: None   Collection Time: 12/27/22  3:55 PM  Result Value Ref Range   Vitamin B-12 561 180 - 914 pg/mL    Comment: (NOTE) This assay is not validated for testing neonatal or myeloproliferative syndrome specimens for Vitamin B12 levels. Performed at [facility], [address] [place], [place] [address]   Comprehensive metabolic panel     Status: Abnormal   Collection Time: 12/27/22  3:55 PM  Result Value Ref Range   Sodium 135 135 - 145 mmol/L   Potassium 3.8 3.5 - 5.1 mmol/L   Chloride 103 98 - 111 mmol/L   CO2 25 22 - 32  mmol/L   Glucose, Bld 162 (H) 70 - 99 mg/dL    Comment: Glucose reference range applies only to samples taken after fasting for at least 8 hours.   BUN 35 (H) 6 - 20 mg/dL   Creatinine, Ser 0.98 (H) 0.61 - 1.24 mg/dL   Calcium 8.6 (L) 8.9 - 10.3 mg/dL   Total Protein 6.8 6.5 - 8.1 g/dL   Albumin 4.1 3.5 - 5.0 g/dL   AST 17 15 - 41 U/L   ALT 20 0 - 44 U/L   Alkaline Phosphatase 46 38 - 126 U/L   Total Bilirubin 0.4 0.3 - 1.2 mg/dL   GFR, Estimated >11 >91 mL/min    Comment: (NOTE) Calculated using the CKD-EPI Creatinine Equation (2021)    Anion gap 7 5 - 15    Comment: Performed at [facility], [address] [place], [place] [address]  CBC with Differential/Platelet     Status: None   Collection Time: 12/27/22  3:55 PM  Result Value Ref Range   WBC 5.9 4.0 - 10.5 K/uL   RBC 4.66 4.22 - 5.81 MIL/uL   Hemoglobin 14.3 13.0 - 17.0 g/dL   HCT 56.2 13.0 - 86.5 %   MCV 86.7 80.0 - 100.0 fL   MCH 30.7 26.0 - 34.0 pg   MCHC 35.4 30.0 - 36.0 g/dL   RDW 78.4 69.6 - 29.5 %   Platelets 152 150 - 400 K/uL   nRBC 0.0 0.0 - 0.2 %   Neutrophils Relative % 65 %   Neutro Abs 3.9 1.7 - 7.7 K/uL   Lymphocytes Relative 20 %   Lymphs Abs 1.2 0.7 - 4.0 K/uL   Monocytes Relative 9 %   Monocytes Absolute 0.5 0.1 - 1.0 K/uL   Eosinophils Relative 4 %   Eosinophils Absolute 0.3 0.0 - 0.5 K/uL   Basophils  Relative 1 %   Basophils Absolute 0.1 0.0 - 0.1 K/uL   Immature Granulocytes 1 %   Abs Immature Granulocytes 0.05 0.00 - 0.07 K/uL    Comment: Performed at [facility], [address], [place], [place] [address]    Diabetic Foot Exam: Diabetic Foot Exam - Simple   Simple Foot Form Visual Inspection No deformities, no ulcerations, no other skin breakdown bilaterally: Yes Sensation Testing Intact to touch and monofilament testing bilaterally: Yes Pulse Check Posterior Tibialis and Dorsalis pulse intact bilaterally: Yes Comments      PHQ2/9:    03/12/2023    1:37 PM  01/30/2022    4:07 PM 12/12/2021    3:20 PM 12/05/2021    7:43 AM 06/06/2021    2:53 PM  Depression screen PHQ 2/9  Decreased Interest 0 0 0 0 0  Down, Depressed, Hopeless 0 0 0 0 0  PHQ - 2 Score 0 0 0 0 0  Altered sleeping 0 1 0 0   Tired, decreased energy 0 1 0 0   Change in appetite 0 0 0 0   Feeling bad or failure about yourself  0 0 0 0   Trouble concentrating 0 0 0 0   Moving slowly or fidgety/restless 0 0 0 0   Suicidal thoughts 0 0 0 0   PHQ-9 Score 0 2 0 0   Difficult doing work/chores  Not difficult at all       phq 9 is negative   Fall Risk:    03/12/2023    1:37 PM 05/28/2022    3:31 PM 01/30/2022    4:07 PM 12/12/2021    3:20 PM 12/05/2021    7:42 AM  Fall Risk   Falls in the past year? 0 0 0 0 0  Number falls in past yr: 0   0 0  Injury with Fall? 0   0 0  Risk for fall due to : No Fall Risks No Fall Risks No Fall Risks No Fall Risks No Fall Risks  Follow up Falls prevention discussed Falls prevention discussed Falls prevention discussed Falls prevention discussed Falls prevention discussed      Functional Status Survey: Is the patient deaf or have difficulty hearing?: No Does the patient have difficulty seeing, even when wearing glasses/contacts?: No Does the patient have difficulty concentrating, remembering, or making decisions?: No Does the patient have difficulty walking or climbing stairs?: No Does the patient have difficulty dressing or bathing?: No Does the patient have difficulty doing errands alone such as visiting a doctor's office or shopping?: No    Assessment & Plan   1. Dyslipidemia associated with type 2 diabetes mellitus (HCC)  - Lipid panel - Microalbumin / creatinine urine ratio  2. Lymphadenopathy, inguinal  - PSA - CBC with Differential/Platelet  3. Benign essential HTN  At goal   4. OSA on CPAP  Compliant   5. Gastroesophageal reflux disease with esophagitis without hemorrhage   6. Statin intolerance  Discussed zetia  but not interested at this time   7. Viral illness  Negative home covid, feeling better today   8. Penile rash  - Herpes simplex virus culture - valACYclovir (VALTREX) 500 MG tablet; Take 1 tablet (500 mg total) by mouth 3 (three) times daily. Prn  Dispense: 30 tablet; Refill: 0  9. Tinea cruris  - fluconazole (DIFLUCAN) 150 MG tablet; Take 1 tablet (150 mg total) by mouth every other day.  Dispense: 3 tablet; Refill: 0 -  ketoconazole (NIZORAL) 2 % cream; Apply 1 Application topically 2 (two) times daily as needed for irritation. Groin rash  Dispense: 60 g; Refill: 0

## 2023-03-12 ENCOUNTER — Encounter: Payer: Self-pay | Admitting: Family Medicine

## 2023-03-12 ENCOUNTER — Ambulatory Visit: Payer: BC Managed Care – PPO | Admitting: Family Medicine

## 2023-03-12 VITALS — BP 124/66 | HR 70 | Resp 16 | Ht 72.0 in | Wt 241.0 lb

## 2023-03-12 DIAGNOSIS — G4733 Obstructive sleep apnea (adult) (pediatric): Secondary | ICD-10-CM | POA: Diagnosis not present

## 2023-03-12 DIAGNOSIS — E1169 Type 2 diabetes mellitus with other specified complication: Secondary | ICD-10-CM

## 2023-03-12 DIAGNOSIS — I10 Essential (primary) hypertension: Secondary | ICD-10-CM | POA: Diagnosis not present

## 2023-03-12 DIAGNOSIS — R590 Localized enlarged lymph nodes: Secondary | ICD-10-CM

## 2023-03-12 DIAGNOSIS — B349 Viral infection, unspecified: Secondary | ICD-10-CM

## 2023-03-12 DIAGNOSIS — B356 Tinea cruris: Secondary | ICD-10-CM

## 2023-03-12 DIAGNOSIS — K2100 Gastro-esophageal reflux disease with esophagitis, without bleeding: Secondary | ICD-10-CM

## 2023-03-12 DIAGNOSIS — R21 Rash and other nonspecific skin eruption: Secondary | ICD-10-CM

## 2023-03-12 DIAGNOSIS — E785 Hyperlipidemia, unspecified: Secondary | ICD-10-CM

## 2023-03-12 DIAGNOSIS — Z794 Long term (current) use of insulin: Secondary | ICD-10-CM

## 2023-03-12 DIAGNOSIS — Z789 Other specified health status: Secondary | ICD-10-CM

## 2023-03-12 MED ORDER — FLUCONAZOLE 150 MG PO TABS
150.0000 mg | ORAL_TABLET | ORAL | 0 refills | Status: DC
Start: 2023-03-12 — End: 2023-05-22

## 2023-03-12 MED ORDER — KETOCONAZOLE 2 % EX CREA
1.0000 | TOPICAL_CREAM | Freq: Two times a day (BID) | CUTANEOUS | 0 refills | Status: DC | PRN
Start: 2023-03-12 — End: 2023-06-11

## 2023-03-12 MED ORDER — VALACYCLOVIR HCL 500 MG PO TABS
500.0000 mg | ORAL_TABLET | Freq: Three times a day (TID) | ORAL | 0 refills | Status: DC
Start: 2023-03-12 — End: 2023-12-16

## 2023-03-14 ENCOUNTER — Encounter: Payer: Self-pay | Admitting: Family Medicine

## 2023-03-14 LAB — CBC WITH DIFFERENTIAL/PLATELET
Absolute Monocytes: 1019 cells/uL — ABNORMAL HIGH (ref 200–950)
Basophils Absolute: 31 cells/uL (ref 0–200)
Basophils Relative: 0.3 %
Eosinophils Absolute: 125 cells/uL (ref 15–500)
Eosinophils Relative: 1.2 %
HCT: 41.6 % (ref 38.5–50.0)
Hemoglobin: 14.2 g/dL (ref 13.2–17.1)
Lymphs Abs: 1030 cells/uL (ref 850–3900)
MCH: 29.8 pg (ref 27.0–33.0)
MCHC: 34.1 g/dL (ref 32.0–36.0)
MCV: 87.4 fL (ref 80.0–100.0)
MPV: 10.8 fL (ref 7.5–12.5)
Monocytes Relative: 9.8 %
Neutro Abs: 8195 cells/uL — ABNORMAL HIGH (ref 1500–7800)
Neutrophils Relative %: 78.8 %
Platelets: 135 10*3/uL — ABNORMAL LOW (ref 140–400)
RBC: 4.76 10*6/uL (ref 4.20–5.80)
RDW: 12.7 % (ref 11.0–15.0)
Total Lymphocyte: 9.9 %
WBC: 10.4 10*3/uL (ref 3.8–10.8)

## 2023-03-14 LAB — LIPID PANEL
Cholesterol: 183 mg/dL (ref <200)
HDL: 35 mg/dL — ABNORMAL LOW (ref >=40)
LDL Cholesterol (Calc): 108 mg/dL (calc) — ABNORMAL HIGH
Non-HDL Cholesterol (Calc): 148 mg/dL (calc) — ABNORMAL HIGH (ref <130)
Total CHOL/HDL Ratio: 5.2 (calc) — ABNORMAL HIGH (ref <5.0)
Triglycerides: 278 mg/dL — ABNORMAL HIGH (ref <150)

## 2023-03-14 LAB — HERPES SIMPLEX VIRUS CULTURE
MICRO NUMBER:: 15070294
SPECIMEN QUALITY:: ADEQUATE

## 2023-03-14 LAB — PSA: PSA: 0.38 ng/mL (ref <=4.00)

## 2023-03-14 LAB — MICROALBUMIN / CREATININE URINE RATIO
Creatinine, Urine: 167 mg/dL (ref 20–320)
Microalb Creat Ratio: 36 mg/g creat — ABNORMAL HIGH (ref <30)
Microalb, Ur: 6 mg/dL

## 2023-03-27 DIAGNOSIS — R600 Localized edema: Secondary | ICD-10-CM | POA: Insufficient documentation

## 2023-03-27 DIAGNOSIS — S76319A Strain of muscle, fascia and tendon of the posterior muscle group at thigh level, unspecified thigh, initial encounter: Secondary | ICD-10-CM | POA: Insufficient documentation

## 2023-05-22 ENCOUNTER — Encounter: Payer: Self-pay | Admitting: Family Medicine

## 2023-05-22 ENCOUNTER — Other Ambulatory Visit: Payer: Self-pay | Admitting: Family Medicine

## 2023-05-22 MED ORDER — TERBINAFINE HCL 250 MG PO TABS: 250.0000 mg | ORAL_TABLET | Freq: Every day | ORAL | 0 refills | Status: DC

## 2023-05-31 NOTE — Progress Notes (Unsigned)
Name: Ryan Ritter   MRN: [id number]    DOB: 1966-06-05   Date:06/04/2023       Progress Note  Subjective  Chief Complaint  CPE  HPI  Patient presents for annual CPE.  IPSS Questionnaire (AUA-7): Over the past month.   1)  How often have you had a sensation of not emptying your bladder completely after you finish urinating?  0 - Not at all  2)  How often have you had to urinate again less than two hours after you finished urinating? 3 - About half the time  3)  How often have you found you stopped and started again several times when you urinated?  0 - Not at all  4) How difficult have you found it to postpone urination?  1 - Less than 1 time in 5  5) How often have you had a weak urinary stream?  1 - Less than 1 time in 5  6) How often have you had to push or strain to begin urination?  2 - Less than half the time  7) How many times did you most typically get up to urinate from the time you went to bed until the time you got up in the morning?  1 - 1 time  Total score:  0-7 mildly symptomatic   8-19 moderately symptomatic   20-35 severely symptomatic     Diet: doing well, he follows a diabetic diet, last A1C April 2024 was 6.4 % , we will obtain records from Dr. Renae Fickle  Exercise:  he is doing strength training once a week, walks his dog for at least 20 minutes per day , longer on the weekend  Last Dental Exam: up to date Last Eye Exam: he is do for an exam   Depression: phq 9 is negative    06/04/2023    8:33 AM 03/12/2023    1:37 PM 01/30/2022    4:07 PM 12/12/2021    3:20 PM 12/05/2021    7:43 AM  Depression screen PHQ 2/9  Decreased Interest 0 0 0 0 0  Down, Depressed, Hopeless 0 0 0 0 0  PHQ - 2 Score 0 0 0 0 0  Altered sleeping 0 0 1 0 0  Tired, decreased energy 3 0 1 0 0  Change in appetite 1 0 0 0 0  Feeling bad or failure about yourself  0 0 0 0 0  Trouble concentrating 0 0 0 0 0  Moving slowly or fidgety/restless 0 0 0 0 0  Suicidal thoughts 0 0 0 0 0  PHQ-9 Score 4 0  2 0 0  Difficult doing work/chores Somewhat difficult  Not difficult at all      Hypertension:  BP Readings from Last 3 Encounters:  06/04/23 128/70  03/12/23 124/66  01/01/23 133/68    Obesity: Wt Readings from Last 3 Encounters:  06/04/23 243 lb 11.2 oz (110.5 kg)  03/12/23 241 lb (109.3 kg)  01/01/23 243 lb 12.8 oz (110.6 kg)   BMI Readings from Last 3 Encounters:  06/04/23 33.05 kg/m  03/12/23 32.69 kg/m  01/01/23 33.07 kg/m     Lipids:  Lab Results  Component Value Date   CHOL 183 03/12/2023   CHOL 207 (H) 12/05/2021   CHOL 172 01/12/2021   Lab Results  Component Value Date   HDL 35 (L) 03/12/2023   HDL 30 (L) 12/05/2021   HDL 34 (L) 01/12/2021   Lab Results  Component Value Date  LDLCALC 108 (H) 03/12/2023   LDLCALC 140 (H) 12/05/2021   LDLCALC 116 (H) 01/12/2021   Lab Results  Component Value Date   TRIG 278 (H) 03/12/2023   TRIG 231 (H) 12/05/2021   TRIG 116 01/12/2021   Lab Results  Component Value Date   CHOLHDL 5.2 (H) 03/12/2023   CHOLHDL 6.9 (H) 12/05/2021   CHOLHDL 5.1 (H) 01/12/2021   No results found for: "LDLDIRECT" Glucose:  Glucose, Bld  Date Value Ref Range Status  12/27/2022 162 (H) 70 - 99 mg/dL Final    Comment:    Glucose reference range applies only to samples taken after fasting for at least 8 hours.  12/05/2021 334 (H) 65 - 99 mg/dL Final    Comment:    .            Fasting reference interval . For someone without known diabetes, a glucose value >125 mg/dL indicates that they may have diabetes and this should be confirmed with a follow-up test. .   01/12/2021 192 (H) 65 - 99 mg/dL Final    Comment:    .            Fasting reference interval . For someone without known diabetes, a glucose value >125 mg/dL indicates that they may have diabetes and this should be confirmed with a follow-up test. .     Flowsheet Row Office Visit from 06/04/2023 in [facility]  AUDIT-C Score 0         Married STD testing and prevention (HIV/chl/gon/syphilis):  not applicable Sexual history: no ED since DM under control  Hep C Screening: up to date  Skin cancer: Discussed monitoring for atypical lesions Colorectal cancer: he is do for colonoscopy, he goes to [facility] and wants to schedule his own visit  Prostate cancer:  yes Lab Results  Component Value Date   PSA 0.38 03/12/2023   PSA 0.5 09/10/2019   PSA 0.4 02/25/2018     Lung cancer:  Low Dose CT Chest recommended if Age 41-80 years, 30 pack-year currently smoking OR have quit w/in 15years. Patient is  not a candidate for screening   AAA: The USPSTF recommends one-time screening with ultrasonography in men ages 29 to 75 years who have ever smoked. Patient is not  a candidate for screening  ECG:  2005 , we will recheck next visit   Vaccines:    Tdap: today  Shingrix: up to date  Pneumonia: up to date  Flu: he will have it at work  COVID-19: he had 3 vaccines  Advanced Care Planning: A voluntary discussion about advance care planning including the explanation and discussion of advance directives.  Discussed health care proxy and Living will, and the patient was able to identify a health care proxy as wife .  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 Al Corpus   METATARSAL OSTEOTOMY Left 03/02/2022   2nd Left - Dr. Al Corpus   tendinitis Left    TRIGGER FINGER RELEASE     vascectomy  08/2014    Family History  Problem Relation Age of Onset   Breast cancer Mother    Colon polyps Mother    Diabetes Father    Pancreatic cancer Father    Diabetes Brother    Diabetes Maternal Grandmother    Cancer Maternal Grandfather        leukemia   Leukemia Maternal Grandfather    Cancer Paternal Grandfather        multiple myeloma   Multiple myeloma Paternal Grandfather     Social History   Socioeconomic History   Marital status: Married    Spouse name: Not on file   Number of children: Not on file   Years of education: Not on file   Highest education level: Not on file  Occupational History   Not on file  Tobacco Use   Smoking status: Never   Smokeless tobacco: Former    Types: Chew    Quit date: 10/01/1988  Vaping Use   Vaping status: Never Used  Substance and Sexual Activity   Alcohol use: No    Alcohol/week: 0.0 standard drinks of alcohol   Drug use: No   Sexual activity: Yes    Partners: Female  Other Topics Concern   Not on file  Social History Narrative   Not on file   Social Determinants of Health   Financial Resource Strain: Low Risk  (12/12/2021)   Overall Financial Resource Strain (CARDIA)    Difficulty of Paying Living Expenses: Not hard at all  Food Insecurity: No Food Insecurity (12/12/2021)   Hunger Vital Sign    Worried About Running Out of Food in the Last Year: Never true    Ran Out of Food in the Last Year: Never true  Transportation Needs: No Transportation  Needs (12/12/2021)   PRAPARE - Administrator, Civil Service (Medical): No    Lack of Transportation (Non-Medical): No  Physical Activity: Insufficiently Active (12/12/2021)   Exercise Vital Sign    Days of Exercise per Week: 7 days    Minutes of Exercise per Session: 20 min  Stress: Stress Concern Present (12/12/2021)   [employer] of Occupational Health - Occupational Stress Questionnaire    Feeling of Stress : To some extent  Social Connections: Socially Integrated (12/12/2021)   Social Connection and Isolation Panel [NHANES]    Frequency of Communication with Friends and Family: More than three times a week    Frequency of Social Gatherings with Friends and Family: Once a week    Attends Religious Services: More than 4 times per year    Active Member of [employer] or Organizations: Yes    Attends Banker Meetings: More than 4 times per year    Marital Status: Married  Catering manager Violence: Not At Risk (12/12/2021)   Humiliation, Afraid, Rape, and Kick questionnaire    Fear of Current or Ex-Partner: No    Emotionally Abused:  No    Physically Abused: No    Sexually Abused: No     Current Outpatient Medications:    Accu-Chek Softclix Lancets lancets, SMARTSIG:2 Topical Twice Daily, Disp: , Rfl:    Boron 3 MG CAPS, Take 1 capsule by mouth 3 (three) times daily., Disp: , Rfl:    Chromium 200 MCG CAPS, Take 200 mcg by mouth 2 (two) times daily. , Disp: , Rfl:    Cinnamon 500 MG TABS, Take 1,000 tablets by mouth 2 (two) times daily., Disp: , Rfl:    Cod Liver Oil 5000-500 UNIT/5ML OIL, Take by mouth., Disp: , Rfl:    Coenzyme Q10 (CO Q10) 200 MG CAPS, Take 200 mg by mouth daily., Disp: , Rfl:    Continuous Blood Gluc Sensor (DEXCOM G7 SENSOR) MISC, 1 each by Does not apply route as directed. Apply one every 10 days, Disp: 9 each, Rfl: 1   glucose blood (ACCU-CHEK GUIDE) test strip, USE TO TEST UP TO 4 TIMES A DAY, Disp: 100 strip, Rfl: 2   Insulin Pen Needle  32G X 4 MM MISC, 1 each by Does not apply route daily at 12 noon. Twice daily prn, Disp: 100 each, Rfl: 2   ketoconazole (NIZORAL) 2 % cream, Apply 1 Application topically 2 (two) times daily as needed for irritation. Groin rash, Disp: 60 g, Rfl: 0   levocetirizine (XYZAL) 5 MG tablet, Take 5 mg by mouth every evening. , Disp: , Rfl:    metFORMIN (GLUCOPHAGE-XR) 750 MG 24 hr tablet, Take 1,500 mg by mouth daily with breakfast., Disp: , Rfl:    NOVOLOG FLEXPEN 100 UNIT/ML FlexPen, Inject 6-14 Units into the skin 2 (two) times daily as needed., Disp: , Rfl:    terbinafine (LAMISIL) 250 MG tablet, Take 1 tablet (250 mg total) by mouth daily., Disp: 7 tablet, Rfl: 0   valACYclovir (VALTREX) 500 MG tablet, Take 1 tablet (500 mg total) by mouth 3 (three) times daily. Prn, Disp: 30 tablet, Rfl: 0   EPINEPHrine 0.3 mg/0.3 mL IJ SOAJ injection, epinephrine 0.3 mg/0.3 mL injection, auto-injector (Patient not taking: Reported on 06/01/2022), Disp: , Rfl:    fluticasone (FLONASE) 50 MCG/ACT nasal spray, 2 sprays in each nostril Once a day Nasally 30 days (Patient not taking: Reported on 03/12/2023), Disp: , Rfl:   Allergies  Allergen Reactions   Benicar [Olmesartan] Other (See Comments)   Lisinopril Other (See Comments)     ROS  Constitutional: Negative for fever or weight change.  Respiratory: Negative for cough and shortness of breath.   Cardiovascular: Negative for chest pain or palpitations.  Gastrointestinal: Negative for abdominal pain, no bowel changes.  Musculoskeletal: Negative for gait problem or joint swelling.  Skin: positive for groin rash   Neurological: Negative for dizziness or headache.  No other specific complaints in a complete review of systems (except as listed in HPI above).    Objective  Vitals:   06/04/23 0817  BP: 128/70  Pulse: 61  Resp: 16  Temp: 97.8 F (36.6 C)  TempSrc: Oral  SpO2: 96%  Weight: 243 lb 11.2 oz (110.5 kg)  Height: 6' (1.829 m)    Body mass  index is 33.05 kg/m.  Physical Exam  Constitutional: Patient appears well-developed and well-nourished. No distress.  HENT: Head: Normocephalic and atraumatic. Ears: B TMs ok, no erythema or effusion; Nose: Nose normal. Mouth/Throat: Oropharynx is clear and moist. No oropharyngeal exudate.  Eyes: Conjunctivae and EOM are normal. Pupils are equal, round, and reactive  to light. No scleral icterus.  Neck: Normal range of motion. Neck supple. No JVD present. No thyromegaly present.  Cardiovascular: Normal rate, regular rhythm and normal heart sounds.  No murmur heard. No BLE edema. Pulmonary/Chest: Effort normal and breath sounds normal. No respiratory distress. Abdominal: Soft. Bowel sounds are normal, no distension. There is no tenderness. no masses MALE GENITALIA: Normal descended testes bilaterally, no masses palpated, no hernias, no lesions, no discharge RECTAL: Prostate normal size and consistency, no rectal masses or hemorrhoids Musculoskeletal: Normal range of motion, no joint effusions. No gross deformities, toe web connected between 3rd and 4 th toes on right foot  Neurological: he is alert and oriented to person, place, and time. No cranial nerve deficit. Coordination, balance, strength, speech and gait are normal.  Skin: Skin is warm and dry. No rash noted. No erythema.  Psychiatric: Patient has a normal mood and affect. behavior is normal. Judgment and thought content normal.   Recent Results (from the past 2160 hour(s))  Lipid panel     Status: Abnormal   Collection Time: 03/12/23  2:28 PM  Result Value Ref Range   Cholesterol 183 <200 mg/dL   HDL 35 (L) > OR = 40 mg/dL   Triglycerides 536 (H) <150 mg/dL    Comment: . If a non-fasting specimen was collected, consider repeat triglyceride testing on a fasting specimen if clinically indicated.  Perry Mount et al. J. of Clin. Lipidol. 2015;9:129-169. Marland Kitchen    LDL Cholesterol (Calc) 108 (H) mg/dL (calc)    Comment: Reference range:  <100 . Desirable range <100 mg/dL for primary prevention;   <70 mg/dL for patients with CHD or diabetic patients  with > or = 2 CHD risk factors. Marland Kitchen LDL-C is now calculated using the Martin-Hopkins  calculation, which is a validated novel method providing  better accuracy than the Friedewald equation in the  estimation of LDL-C.  Horald Pollen et al. Lenox Ahr. 6440;347(42): 2061-2068  (http://education.[facility].com/faq/FAQ164)    Total CHOL/HDL Ratio 5.2 (H) <5.0 (calc)   Non-HDL Cholesterol (Calc) 148 (H) <130 mg/dL (calc)    Comment: For patients with diabetes plus 1 major ASCVD risk  factor, treating to a non-HDL-C goal of <100 mg/dL  (LDL-C of <59 mg/dL) is considered a therapeutic  option.   Microalbumin / creatinine urine ratio     Status: Abnormal   Collection Time: 03/12/23  2:28 PM  Result Value Ref Range   Creatinine, Urine 167 20 - 320 mg/dL   Microalb, Ur 6.0 mg/dL    Comment: Reference Range Not established    Microalb Creat Ratio 36 (H) <30 mg/g creat    Comment: . The ADA defines abnormalities in albumin excretion as follows: Marland Kitchen Albuminuria Category        Result (mg/g creatinine) . Normal to Mildly increased   <30 Moderately increased         30-299  Severely increased           > OR = 300 . The ADA recommends that at least two of three specimens collected within a 3-6 month period be abnormal before considering a patient to be within a diagnostic category.   PSA     Status: None   Collection Time: 03/12/23  2:28 PM  Result Value Ref Range   PSA 0.38 < OR = 4.00 ng/mL    Comment: The total PSA value from this assay system is  standardized against the WHO standard. The test  result will be approximately 20% lower when  compared  to the equimolar-standardized total PSA (Beckman  Coulter). Comparison of serial PSA results should be  interpreted with this fact in mind. . This test was performed using the Siemens  chemiluminescent method. Values  obtained from  different assay methods cannot be used interchangeably. PSA levels, regardless of value, should not be interpreted as absolute evidence of the presence or absence of disease.   CBC with Differential/Platelet     Status: Abnormal   Collection Time: 03/12/23  2:28 PM  Result Value Ref Range   WBC 10.4 3.8 - 10.8 Thousand/uL   RBC 4.76 4.20 - 5.80 Million/uL   Hemoglobin 14.2 13.2 - 17.1 g/dL   HCT 53.6 64.4 - 03.4 %   MCV 87.4 80.0 - 100.0 fL   MCH 29.8 27.0 - 33.0 pg   MCHC 34.1 32.0 - 36.0 g/dL   RDW 74.2 59.5 - 63.8 %   Platelets 135 (L) 140 - 400 Thousand/uL   MPV 10.8 7.5 - 12.5 fL   Neutro Abs 8,195 (H) 1,500 - 7,800 cells/uL   Lymphs Abs 1,030 850 - 3,900 cells/uL   Absolute Monocytes 1,019 (H) 200 - 950 cells/uL   Eosinophils Absolute 125 15 - 500 cells/uL   Basophils Absolute 31 0 - 200 cells/uL   Neutrophils Relative % 78.8 %   Total Lymphocyte 9.9 %   Monocytes Relative 9.8 %   Eosinophils Relative 1.2 %   Basophils Relative 0.3 %  Herpes simplex virus culture     Status: None   Collection Time: 03/12/23  2:28 PM   Specimen: Other  Result Value Ref Range   MICRO NUMBER: [id number]    SPECIMEN QUALITY: Adequate    Source GENITAL    STATUS: FINAL    HSV CULTURE: Not Isolated      Fall Risk:    03/12/2023    1:37 PM 05/28/2022    3:31 PM 01/30/2022    4:07 PM 12/12/2021    3:20 PM 12/05/2021    7:42 AM  Fall Risk   Falls in the past year? 0 0 0 0 0  Number falls in past yr: 0   0 0  Injury with Fall? 0   0 0  Risk for fall due to : No Fall Risks No Fall Risks No Fall Risks No Fall Risks No Fall Risks  Follow up Falls prevention discussed Falls prevention discussed Falls prevention discussed Falls prevention discussed Falls prevention discussed     Functional Status Survey: Is the patient deaf or have difficulty hearing?: No Does the patient have difficulty seeing, even when wearing glasses/contacts?: No Does the patient have difficulty  concentrating, remembering, or making decisions?: Yes Does the patient have difficulty walking or climbing stairs?: No Does the patient have difficulty dressing or bathing?: No Does the patient have difficulty doing errands alone such as visiting a doctor's office or shopping?: No    Assessment & Plan  1. Well adult exam  - HM Diabetes Foot Exam - Vitamin B12 - COMPLETE METABOLIC PANEL WITH GFR - Hemoglobin A1c  2. Tinea cruris  - Ambulatory referral to Dermatology  3. Need for Tdap vaccination  - Tdap vaccine greater than or equal to 7yo IM  4. Dyslipidemia associated with type 2 diabetes mellitus (HCC)  - Hemoglobin A1c  5. Long-term use of high-risk medication  - COMPLETE METABOLIC PANEL WITH GFR  6. Low serum vitamin B12  - Vitamin B12  .  -Prostate cancer screening and PSA options (with potential  risks and benefits of testing vs not testing) were discussed along with recent recs/guidelines. -USPSTF grade A and B recommendations reviewed with patient; age-appropriate recommendations, preventive care, screening tests, etc discussed and encouraged; healthy living encouraged; see AVS for patient education given to patient -Discussed importance of 150 minutes of physical activity weekly, eat two servings of fish weekly, eat one serving of tree nuts ( cashews, pistachios, pecans, almonds.Marland Kitchen) every other day, eat 6 servings of fruit/vegetables daily and drink plenty of water and avoid sweet beverages.  -Reviewed Health Maintenance: yes

## 2023-06-04 ENCOUNTER — Ambulatory Visit (INDEPENDENT_AMBULATORY_CARE_PROVIDER_SITE_OTHER): Payer: BC Managed Care – PPO | Admitting: Family Medicine

## 2023-06-04 ENCOUNTER — Encounter: Payer: Self-pay | Admitting: Family Medicine

## 2023-06-04 VITALS — BP 128/70 | HR 61 | Temp 97.8°F | Resp 16 | Ht 72.0 in | Wt 243.7 lb

## 2023-06-04 DIAGNOSIS — Z23 Encounter for immunization: Secondary | ICD-10-CM | POA: Diagnosis not present

## 2023-06-04 DIAGNOSIS — Z0000 Encounter for general adult medical examination without abnormal findings: Secondary | ICD-10-CM

## 2023-06-04 DIAGNOSIS — E1169 Type 2 diabetes mellitus with other specified complication: Secondary | ICD-10-CM | POA: Diagnosis not present

## 2023-06-04 DIAGNOSIS — Z0001 Encounter for general adult medical examination with abnormal findings: Secondary | ICD-10-CM

## 2023-06-04 DIAGNOSIS — B356 Tinea cruris: Secondary | ICD-10-CM | POA: Diagnosis not present

## 2023-06-04 DIAGNOSIS — E538 Deficiency of other specified B group vitamins: Secondary | ICD-10-CM

## 2023-06-04 DIAGNOSIS — E785 Hyperlipidemia, unspecified: Secondary | ICD-10-CM

## 2023-06-04 DIAGNOSIS — Z79899 Other long term (current) drug therapy: Secondary | ICD-10-CM

## 2023-06-05 LAB — COMPLETE METABOLIC PANEL WITH GFR
AG Ratio: 1.7 (calc) (ref 1.0–2.5)
ALT: 17 U/L (ref 9–46)
AST: 14 U/L (ref 10–35)
Albumin: 4.5 g/dL (ref 3.6–5.1)
Alkaline phosphatase (APISO): 40 U/L (ref 35–144)
BUN: 22 mg/dL (ref 7–25)
CO2: 25 mmol/L (ref 20–32)
Calcium: 8.9 mg/dL (ref 8.6–10.3)
Chloride: 104 mmol/L (ref 98–110)
Creat: 0.88 mg/dL (ref 0.70–1.30)
Globulin: 2.6 g/dL (ref 1.9–3.7)
Glucose, Bld: 216 mg/dL — ABNORMAL HIGH (ref 65–99)
Potassium: 4.2 mmol/L (ref 3.5–5.3)
Sodium: 138 mmol/L (ref 135–146)
Total Bilirubin: 0.5 mg/dL (ref 0.2–1.2)
Total Protein: 7.1 g/dL (ref 6.1–8.1)
eGFR: 100 mL/min/{1.73_m2} (ref >=60)

## 2023-06-05 LAB — HEMOGLOBIN A1C
Hgb A1c MFr Bld: 7.2 %{Hb} — ABNORMAL HIGH (ref <5.7)
Mean Plasma Glucose: 160 mg/dL
eAG (mmol/L): 8.9 mmol/L

## 2023-06-05 LAB — VITAMIN B12: Vitamin B-12: 584 pg/mL (ref 200–1100)

## 2023-06-10 NOTE — Progress Notes (Unsigned)
Name: Ryan Ritter   MRN: [id number]    DOB: 03/13/1966   Date:06/10/2023       Progress Note  Subjective  Chief Complaint  Follow Up  HPI  DMII: since March 2023  he has been using a  CGM  his glucose is going down.  He is now seeing Dr. Renae Fickle and off Evaristo Bury, taking novalog in am's and sometimes in the evening, and Metformin 1500 mg daily, he tried Ozempic but could not tolerate medication . He could not tolerate Rosuvastatin - affected his memory. Last A1C was done 12/2022 and it was at goal at 6.4 %    OSA: he is using CPAP machine,  he does not snore when he uses the CPAP, he still feels tired when he wakes up, however he does not go to bed early, he only sleeps on average 6 hours per night . He tries to sleep more on weekends    Obesity:weight is stable. He tries to eat healthy and following a diabetic diet   Thrombocytopenia: reviewed previous labs with him, it has been hovering in 51's  Paternal grandfather had Multiple Myeloma and Maternal grandfather had leukemia. Seen by Dr. Cathie Hoops and levels normalized   B12 : improved   Lymphadenopathy right inguinal : he noticed a rash on his penis and scrotum 3 weeks ago , initially very painful, he had an MRI done for evaluation of right hip pain after a fall while running and it showed lymphadenopathy. He states it is going down in size. His wife had herpes type 2 prior to their marriage but he never had an outbreak before.   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 Mayford Knife at [facility] and will have PT . Worse pain is on buttocks   Viral illness: he states two days ago he had fatigue, followed by burning sensation in his eyes, mild sore throat, yesterday felt very tired and fever 101.5 , and spiked again last night and headache. 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 Al Corpus   METATARSAL OSTEOTOMY Left 03/02/2022   2nd Left - Dr. Al Corpus   tendinitis Left    TRIGGER FINGER RELEASE     vascectomy  08/2014    Family History  Problem Relation Age of Onset   Breast cancer Mother    Colon polyps Mother    Diabetes Father    Pancreatic cancer Father    Diabetes Brother    Diabetes Maternal Grandmother  Cancer Maternal Grandfather        leukemia   Leukemia Maternal Grandfather    Cancer Paternal Grandfather        multiple myeloma   Multiple myeloma Paternal Grandfather     Social History   Tobacco Use   Smoking status: Never   Smokeless tobacco: Former    Types: Chew    Quit date: 10/01/1988  Substance Use Topics   Alcohol use: No    Alcohol/week: 0.0 standard drinks of alcohol     Current Outpatient Medications:    Accu-Chek Softclix Lancets lancets, SMARTSIG:2 Topical Twice Daily, Disp: , Rfl:    Boron 3 MG CAPS, Take 1 capsule by mouth 3 (three) times daily., Disp: , Rfl:    Chromium 200 MCG CAPS, Take 200 mcg by mouth 2 (two) times daily. , Disp: , Rfl:    Cinnamon 500 MG  TABS, Take 1,000 tablets by mouth 2 (two) times daily., Disp: , Rfl:    Cod Liver Oil 5000-500 UNIT/5ML OIL, Take by mouth., Disp: , Rfl:    Coenzyme Q10 (CO Q10) 200 MG CAPS, Take 200 mg by mouth daily., Disp: , Rfl:    Continuous Blood Gluc Sensor (DEXCOM G7 SENSOR) MISC, 1 each by Does not apply route as directed. Apply one every 10 days, Disp: 9 each, Rfl: 1   EPINEPHrine 0.3 mg/0.3 mL IJ SOAJ injection, epinephrine 0.3 mg/0.3 mL injection, auto-injector (Patient not taking: Reported on 06/01/2022), Disp: , Rfl:    fluticasone (FLONASE) 50 MCG/ACT nasal spray, 2 sprays in each nostril Once a day Nasally 30 days (Patient not taking: Reported on 03/12/2023), Disp: , Rfl:    glucose blood (ACCU-CHEK GUIDE) test strip, USE TO TEST UP TO 4 TIMES A DAY, Disp: 100 strip, Rfl: 2   Insulin Pen Needle 32G X 4 MM MISC, 1 each by Does not apply route daily at 12 noon. Twice daily prn, Disp: 100 each, Rfl: 2   ketoconazole (NIZORAL) 2 % cream, Apply 1 Application topically 2 (two) times daily as needed for irritation. Groin rash, Disp: 60 g, Rfl: 0   levocetirizine (XYZAL) 5 MG tablet, Take 5 mg by mouth every evening. , Disp: , Rfl:    metFORMIN (GLUCOPHAGE-XR) 750 MG 24 hr tablet, Take 1,500 mg by mouth daily with breakfast., Disp: , Rfl:    NOVOLOG FLEXPEN 100 UNIT/ML FlexPen, Inject 6-14 Units into the skin 2 (two) times daily as needed., Disp: , Rfl:    terbinafine (LAMISIL) 250 MG tablet, Take 1 tablet (250 mg total) by mouth daily., Disp: 7 tablet, Rfl: 0   valACYclovir (VALTREX) 500 MG tablet, Take 1 tablet (500 mg total) by mouth 3 (three) times daily. Prn, Disp: 30 tablet, Rfl: 0  Allergies  Allergen Reactions   Benicar [Olmesartan] Other (See Comments)   Lisinopril Other (See Comments)    I personally reviewed active problem list, medication list, allergies, family history, social history, health maintenance with the patient/caregiver today.   ROS  ***  Objective  There were no vitals  filed for this visit.  There is no height or weight on file to calculate BMI.  Physical Exam ***   PHQ2/9:    06/04/2023    8:33 AM 03/12/2023    1:37 PM 01/30/2022    4:07 PM 12/12/2021    3:20 PM 12/05/2021    7:43 AM  Depression screen PHQ 2/9  Decreased Interest 0 0 0 0 0  Down, Depressed, Hopeless 0 0 0 0  0  PHQ - 2 Score 0 0 0 0 0  Altered sleeping 0 0 1 0 0  Tired, decreased energy 3 0 1 0 0  Change in appetite 1 0 0 0 0  Feeling bad or failure about yourself  0 0 0 0 0  Trouble concentrating 0 0 0 0 0  Moving slowly or fidgety/restless 0 0 0 0 0  Suicidal thoughts 0 0 0 0 0  PHQ-9 Score 4 0 2 0 0  Difficult doing work/chores Somewhat difficult  Not difficult at all      phq 9 is {gen pos [id number]}   Fall Risk:    03/12/2023    1:37 PM 05/28/2022    3:31 PM 01/30/2022    4:07 PM 12/12/2021    3:20 PM 12/05/2021    7:42 AM  Fall Risk   Falls in the past year? 0 0 0 0 0  Number falls in past yr: 0   0 0  Injury with Fall? 0   0 0  Risk for fall due to : No Fall Risks No Fall Risks No Fall Risks No Fall Risks No Fall Risks  Follow up Falls prevention discussed Falls prevention discussed Falls prevention discussed Falls prevention discussed Falls prevention discussed      Functional Status Survey:      Assessment & Plan  *** There are no diagnoses linked to this encounter.

## 2023-06-11 ENCOUNTER — Ambulatory Visit (INDEPENDENT_AMBULATORY_CARE_PROVIDER_SITE_OTHER): Payer: BC Managed Care – PPO | Admitting: Family Medicine

## 2023-06-11 ENCOUNTER — Encounter: Payer: Self-pay | Admitting: Family Medicine

## 2023-06-11 VITALS — BP 136/72 | HR 62 | Resp 16 | Ht 72.0 in | Wt 243.0 lb

## 2023-06-11 DIAGNOSIS — E1169 Type 2 diabetes mellitus with other specified complication: Secondary | ICD-10-CM

## 2023-06-11 DIAGNOSIS — E538 Deficiency of other specified B group vitamins: Secondary | ICD-10-CM | POA: Diagnosis not present

## 2023-06-11 DIAGNOSIS — Z7984 Long term (current) use of oral hypoglycemic drugs: Secondary | ICD-10-CM

## 2023-06-11 DIAGNOSIS — D696 Thrombocytopenia, unspecified: Secondary | ICD-10-CM

## 2023-06-11 DIAGNOSIS — Z789 Other specified health status: Secondary | ICD-10-CM

## 2023-06-11 DIAGNOSIS — E785 Hyperlipidemia, unspecified: Secondary | ICD-10-CM

## 2023-06-11 DIAGNOSIS — G4733 Obstructive sleep apnea (adult) (pediatric): Secondary | ICD-10-CM | POA: Diagnosis not present

## 2023-06-11 MED ORDER — B-12 1000 MCG SL SUBL
1.0000 | SUBLINGUAL_TABLET | SUBLINGUAL | 0 refills | Status: DC
Start: 2023-06-12 — End: 2024-06-29

## 2023-06-20 ENCOUNTER — Encounter: Payer: Self-pay | Admitting: Internal Medicine

## 2023-06-21 IMAGING — MR MR ANKLE*L* W/O CM
5 series · 40 of 40 positions shown · non-contrast
Comparison: Left foot radiographs 11/15/2021; MRI left heel
06/19/2019

CLINICAL DATA: Ankle pain. Tendon abnormality suspected. Evaluate
peroneal tendon tear. Surgical consideration. Status post left ankle
tendon repair [DATE].

EXAM:
MRI OF THE LEFT ANKLE WITHOUT CONTRAST
TECHNIQUE: Multiplanar, multisequence MR imaging of the ankle was performed. No
intravenous contrast was administered.

[Series 4: T2 fat-sat · axial · 3.0mm · 0.62mm/px · z∈[-104,+28]mm · 9 of 35 slices shown (1 of 2)]
[im 1/35]
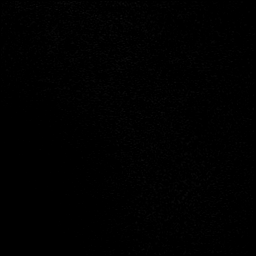
[im 5/35]
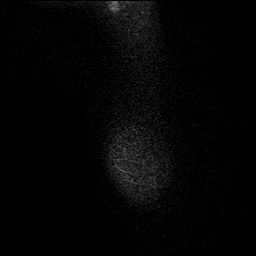
[im 9/35]
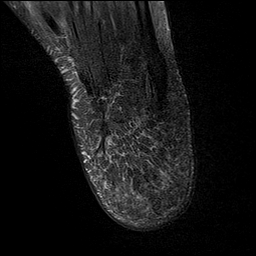
[im 13/35]
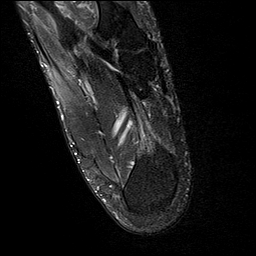
[im 18/35]
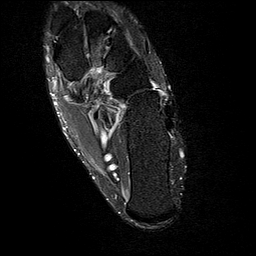
[im 22/35]
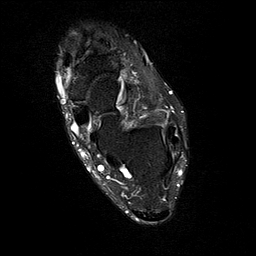
[im 26/35]
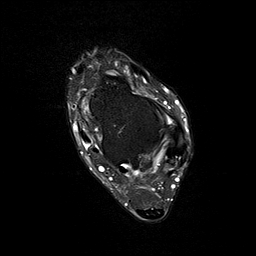
[im 30/35]
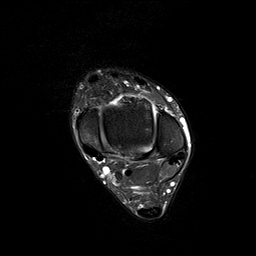
[im 35/35]
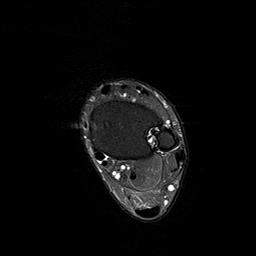

[Series 5: PD fat-sat · axial · 3.0mm · 0.62mm/px · z∈[-104,+35]mm · 9 of 37 slices shown]
[im 1/37]
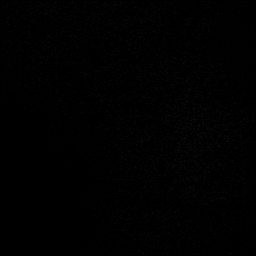
[im 5/37]
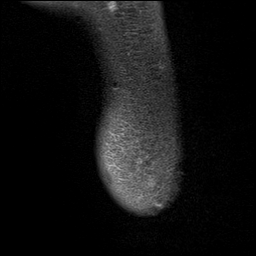
[im 10/37]
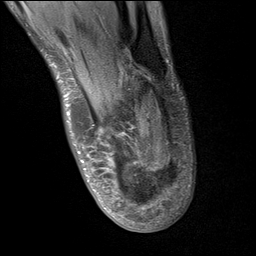
[im 14/37]
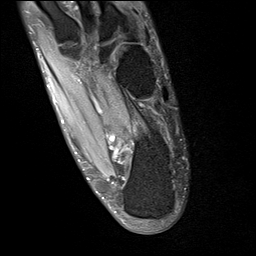
[im 19/37]
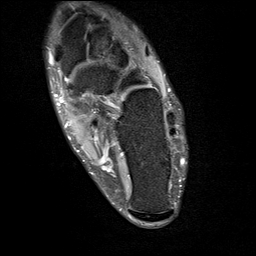
[im 23/37]
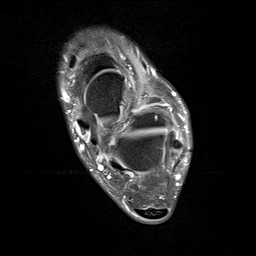
[im 28/37]
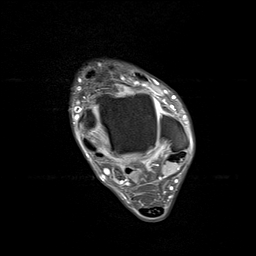
[im 32/37]
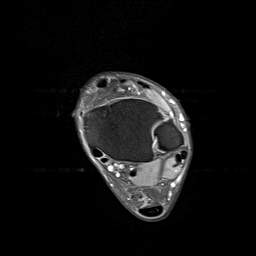
[im 37/37]
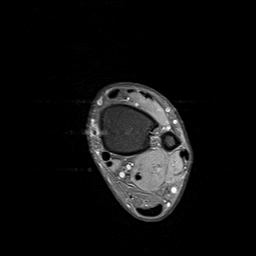

[Series 6: T1 · sagittal · 4.0mm · 0.53mm/px · 6 of 23 slices shown]
[im 1/23]
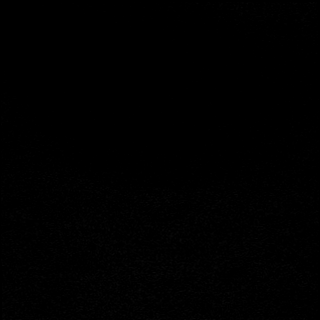
[im 5/23]
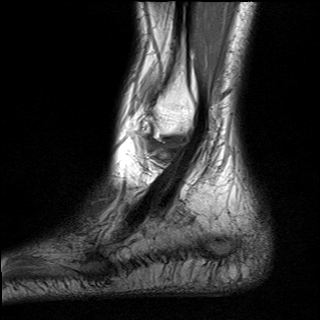
[im 9/23]
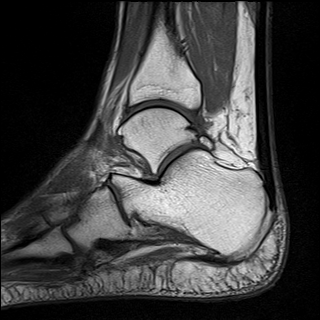
[im 14/23]
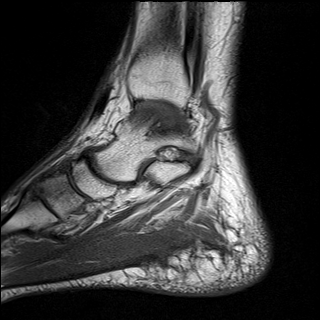
[im 18/23]
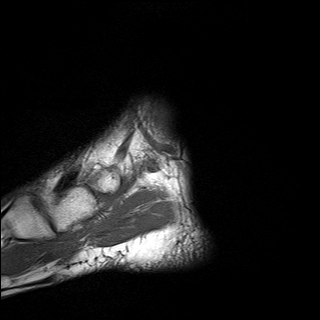
[im 23/23]
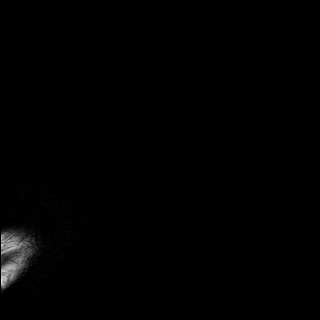

[Series 7: STIR · sagittal · 4.0mm · 0.33mm/px · 6 of 23 slices shown]
[im 1/23]
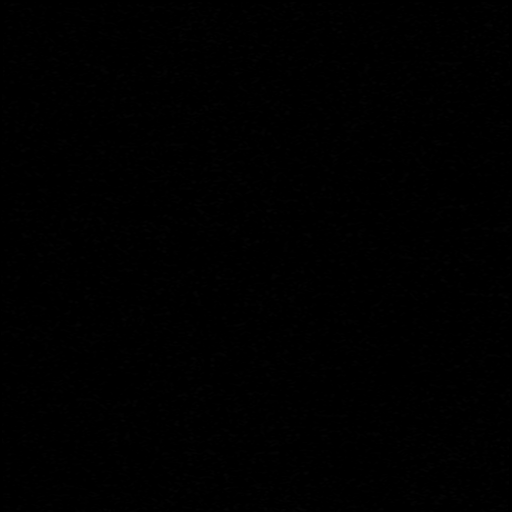
[im 5/23]
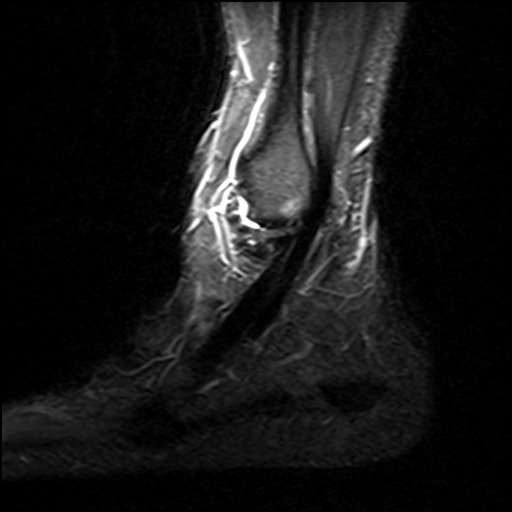
[im 9/23]
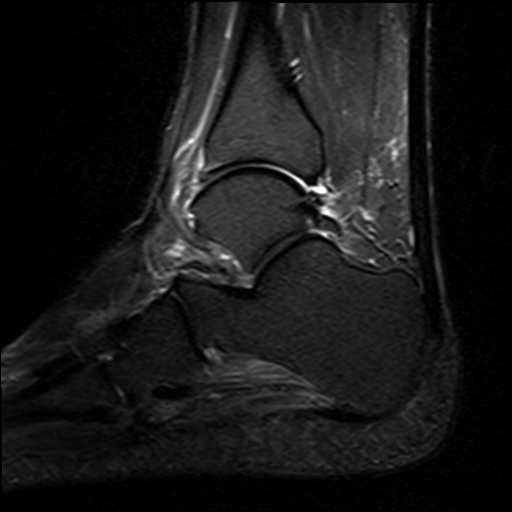
[im 14/23]
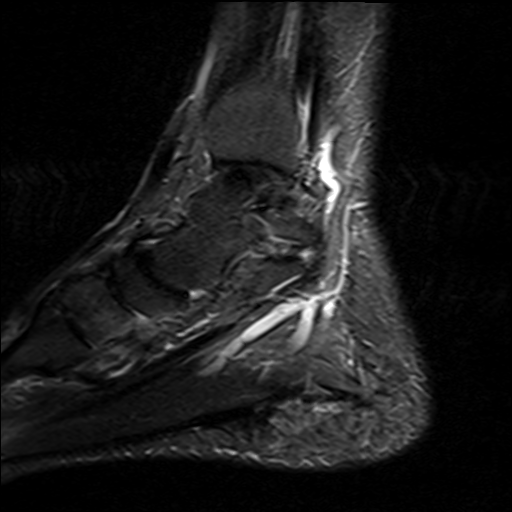
[im 18/23]
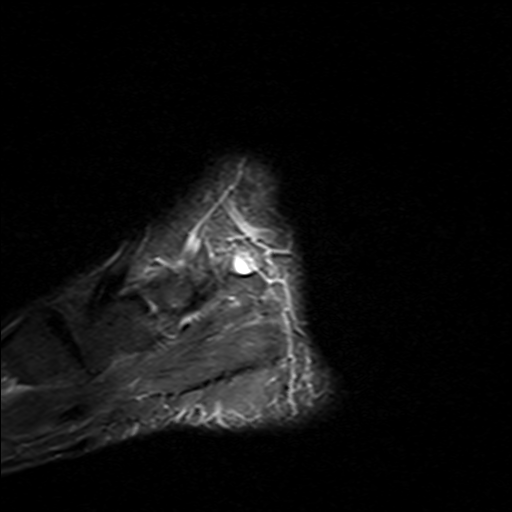
[im 23/23]
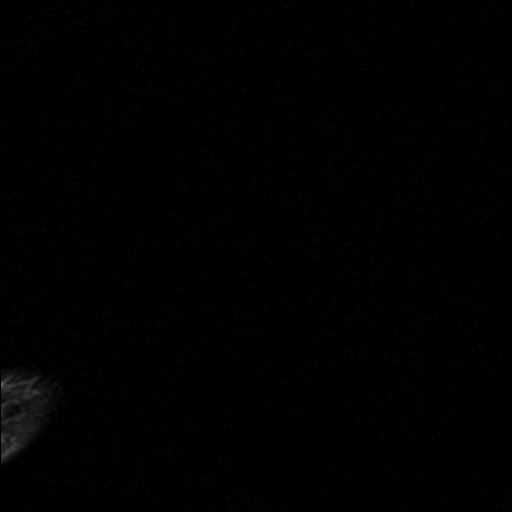

[Series 8: T2 fat-sat · coronal · 3.0mm · 0.62mm/px · 10 of 41 slices shown (2 of 2)]
[im 1/41]
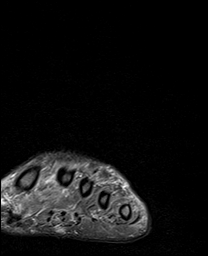
[im 5/41]
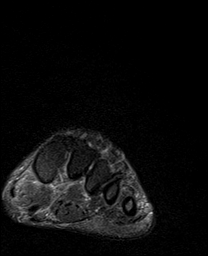
[im 9/41]
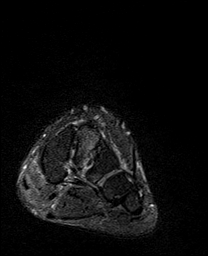
[im 14/41]
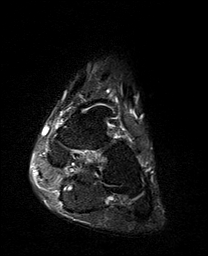
[im 18/41]
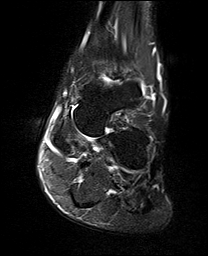
[im 23/41]
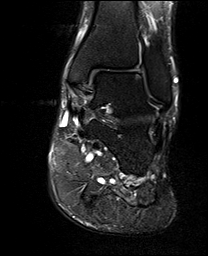
[im 27/41]
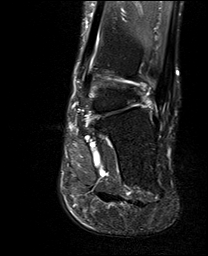
[im 32/41]
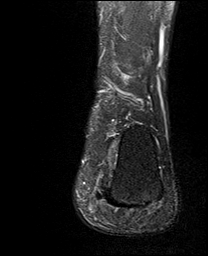
[im 36/41]
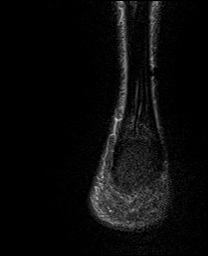
[im 41/41]
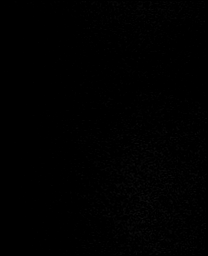

[40 of 40 positions shown; findings below may reference images not displayed]

FINDINGS: TENDONS

Peroneal: There is mild attenuation but resolution of the prior
"Yee configuration" of the peroneus brevis tendon just distal to
the fibula, presumably secondary to interval repair/debridement of
the prior split tear. The peroneus longus tendon is intact.

Posteromedial: Mild posterior tibial tenosynovitis. The flexor
digitorum longus and flexor hallucis longus tendons are intact.

Anterior: The tibialis anterior, extensor hallucis longus and
extensor digitorum longus tendons are intact.

Achilles: Intact.

Plantar Fascia: Mild-to-moderate plantar calcaneal heel spur. Mild
chronic thickening of the medial band of the plantar fascia. No
increased signal to indicate active plantar fasciitis.

LIGAMENTS

Lateral: The anterior and posterior talofibular, anterior and
posterior tibiofibular, and calcaneofibular ligaments are intact.

Medial: Interval increase in moderate fluid bright signal within the
origin of the tibiotalar deep deltoid ligament off of the medial
malleolus (coronal series 8, image 18) and now mild-to-moderate
partial-thickness tear without ligament retraction.

CARTILAGE

Ankle Joint: There is again high-grade partial to full-thickness
cartilage loss within the far lateral, mid to anterior aspect of the
talar dome (coronal images 19 through 23 and sagittal image 16).
There is mild-to-moderate subchondral increased T2 signal in this
region. Overall this is mildly worsened from prior. There is again
focal high-grade partial to full-thickness cartilage loss within the
posteromedial aspect of the talar dome (coronal image 16 and
sagittal image 11) with thinning of the adjacent tibial plafond
cartilage, unchanged from prior.

Subtalar Joints/Sinus Tarsi: Fat is preserved within sinus tarsi.
Mild thinning of the posterior subtalar joint cartilage.

Bones: No acute fracture.

Other: Mild-to-moderate talonavicular and navicular-cuneiform
osteoarthritis including cartilage thinning and subchondral
increased T2 signal greatest at the navicular-intermediate cuneiform
and navicular-lateral cuneiform articulations. Previously there was
predominantly degenerative cystic change in this region, now with
interval increase in surrounding edema (axial series 4, image 17).
IMPRESSION: :
IMPRESSION: 1. Mild attenuation but resolution of the prior "Yee
configuration" of the peroneus brevis tendon just distal to the
fibula, presumably secondary to interval repair/debridement of the
prior split tear.
2. Interval increase in now mild-to-moderate partial-thickness
midsubstance tear at the origin of the tibiotalar deep deltoid
ligament off of the medial malleolus.
3. Mild worsening of high-grade partial to full-thickness cartilage
loss throughout the far lateral, mid to anterior aspect of the talar
dome. Unchanged focal high-grade thinning of the posteromedial talar
dome and adjacent tibial plafond cartilage.
4. Mild-to-moderate navicular-intermediate and navicular-lateral
cuneiform osteoarthritis with chronic cystic change and increased
surrounding marrow edema.

## 2023-06-28 LAB — HM DIABETES EYE EXAM

## 2023-07-04 ENCOUNTER — Ambulatory Visit: Payer: BC Managed Care – PPO | Admitting: Dermatology

## 2023-07-04 ENCOUNTER — Encounter: Payer: Self-pay | Admitting: Dermatology

## 2023-07-04 VITALS — BP 111/66

## 2023-07-04 DIAGNOSIS — L2489 Irritant contact dermatitis due to other agents: Secondary | ICD-10-CM

## 2023-07-04 DIAGNOSIS — B372 Candidiasis of skin and nail: Secondary | ICD-10-CM | POA: Diagnosis not present

## 2023-07-04 DIAGNOSIS — L739 Follicular disorder, unspecified: Secondary | ICD-10-CM | POA: Diagnosis not present

## 2023-07-04 DIAGNOSIS — B356 Tinea cruris: Secondary | ICD-10-CM

## 2023-07-04 DIAGNOSIS — D1801 Hemangioma of skin and subcutaneous tissue: Secondary | ICD-10-CM

## 2023-07-04 DIAGNOSIS — L720 Epidermal cyst: Secondary | ICD-10-CM | POA: Diagnosis not present

## 2023-07-04 MED ORDER — CLOBETASOL PROPIONATE 0.05 % EX OINT: 1.0000 | TOPICAL_OINTMENT | Freq: Two times a day (BID) | CUTANEOUS | 1 refills | Status: DC

## 2023-07-04 MED ORDER — CLINDAMYCIN PHOSPHATE 1 % EX LOTN: TOPICAL_LOTION | Freq: Every day | CUTANEOUS | 4 refills | Status: DC

## 2023-07-04 MED ORDER — KETOCONAZOLE 2 % EX CREA
1.0000 | TOPICAL_CREAM | Freq: Two times a day (BID) | CUTANEOUS | 3 refills | Status: AC
Start: 2023-07-04 — End: 2023-11-01

## 2023-07-04 MED ORDER — DOXYCYCLINE MONOHYDRATE 100 MG PO CAPS: 100.0000 mg | ORAL_CAPSULE | ORAL | 2 refills | Status: DC

## 2023-07-04 NOTE — Progress Notes (Signed)
New Patient Visit   Subjective  Ryan Ritter is a 57 y.o. male who presents for the following: fungus groin, toes, ~33m,  has taken Fluconazole 200mg   x 3 doses, 1 week of Terbinafine, and tried ketoconazole 2% cr improved but didn't clear, currently using otc antifungal cr, pt states area in groin is red when he gets out of shower, breaking out post neck, ~11m this episode, used Doxycycline in past check growth back ~39m, pt says it fills up with matter and he pops it and stuff comes out, tender prn, check red spots chest, check fingers, hx of splitting of skin The patient has spots, moles and lesions to be evaluated, some may be new or changing and the patient may have concern these could be cancer.  New patient referral from dr. Alba Cory.  The following portions of the chart were reviewed this encounter and updated as appropriate: medications, allergies, medical history  Review of Systems:  No other skin or systemic complaints except as noted in HPI or Assessment and Plan.  Objective  Well appearing patient in no apparent distress; mood and affect are within normal limits.   A focused examination was performed of the following areas: Feet, groin, back, scalp  Relevant exam findings are noted in the Assessment and Plan.    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 It can be infectious  (bacterial, fungal) or noninfectious (shaving, tight clothing, heat/sweat, medications).  Folliculitis can be acute or chronic and recommended treatment depends on the underlying cause of folliculitis.  Treatment Plan: Start Clindamycin cr bid aa scalp When flared start Doxycycline 100mg  1 po bid with food and drink for 2 weeks prn flares  Doxycycline should be taken with food to prevent nausea. Do not lay down for 30 minutes after taking. Be cautious with sun exposure and use good sun protection while on this medication. Pregnant women should not take this medication.    EPIDERMAL INCLUSION CYST Exam: Subcutaneous nodule at R scapula back  Benign-appearing. Exam most consistent with an epidermal inclusion cyst. Discussed that a cyst is a benign growth that can grow over time and sometimes get irritated or inflamed. Recommend observation if it is not bothersome. Discussed option of surgical excision to remove it if it is growing, symptomatic, or other changes noted. Please call for new or changing lesions so they can be evaluated. - patient will decide on excision later  HEMANGIOMA chest Exam: red papule(s) Discussed benign nature. Recommend observation. Call for changes.  IRRITANT CONTACT DERMATITIS fingers Exam:Fissure/plaque R third finger tip. 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 Your physician has selected the right strength medicine for your problem and area affected on the body. Please use your medication only  as directed by your physician to prevent side effects.     Return if symptoms worsen or fail to improve.  I, Ardis Rowan, RMA, am acting as scribe for Elie Goody, MD .   Documentation: I have reviewed the above documentation for accuracy and completeness, and I agree with the above.  Elie Goody, MD

## 2023-07-04 NOTE — Patient Instructions (Addendum)
For toes Continue the Ketoconazole 2% cream twice a day until toes cleared  For bumps on scalp/neck Start Clindamycin lotion twice a day Start Doxycycline 100mg  1 pill 2 times a day with food and drink for 2 weeks as needed when flared  For fingers splitting Start Clobetasol ointment twice a day as needed for flares, avoid face, groin, axilla   Due to recent changes in healthcare laws, you may see results of your pathology and/or laboratory studies on MyChart before the doctors have had a chance to review them. We understand that in some cases there may be results that are confusing or concerning to you. Please understand that not all results are received at the same time and often the doctors may need to interpret multiple results in order to provide you with the best plan of care or course of treatment. Therefore, we ask that you please give [name] 2 business days to thoroughly review all your results before contacting the office for clarification. Should we see a critical lab result, you will be contacted sooner.   If You Need Anything After Your Visit  If you have any questions or concerns for your doctor, please call our main line at [phone] and press option 4 to reach your doctor's medical assistant. If no one answers, please leave a voicemail as directed and we will return your call as soon as possible. Messages left after 4 pm will be answered the following business day.   You may also send [name] a message via MyChart. We typically respond to MyChart messages within 1-2 business days.  For prescription refills, please ask your pharmacy to contact our office. Our fax number is [phone].  If you have an urgent issue when the clinic is closed that cannot wait until the next business day, you can page your doctor at the number below.    Please note that while we do our best to be available for urgent issues outside of office hours, we are not available 24/7.   If you have an urgent  issue and are unable to reach [name], you may choose to seek medical care at your doctor's office, retail clinic, urgent care center, or emergency room.  If you have a medical emergency, please immediately call 911 or go to the emergency department.  Pager Numbers  - Dr. Gwen Pounds: [phone]  - Dr. Roseanne Reno: [phone]  - Dr. Katrinka Blazing: [phone]   In the event of inclement weather, please call our main line at [phone] for an update on the status of any delays or closures.  Dermatology Medication Tips: Please keep the boxes that topical medications come in in order to help keep track of the instructions about where and how to use these. Pharmacies typically print the medication instructions only on the boxes and not directly on the medication tubes.   If your medication is too expensive, please contact our office at [phone] option 4 or send [name] a message through MyChart.   We are unable to tell what your co-pay for medications will be in advance as this is different depending on your insurance coverage. However, we may be able to find a substitute medication at lower cost or fill out paperwork to get insurance to cover a needed medication.   If a prior authorization is required to get your medication covered by your insurance company, please allow [name] 1-2 business days to complete this process.  Drug prices often vary depending on where the prescription is filled and some pharmacies  may offer cheaper prices.  The website www.goodrx.com contains coupons for medications through different pharmacies. The prices here do not account for what the cost may be with help from insurance (it may be cheaper with your insurance), but the website can give you the price if you did not use any insurance.  - You can print the associated coupon and take it with your prescription to the pharmacy.  - You may also stop by our office during regular business hours and pick up a GoodRx coupon card.  - If  you need your prescription sent electronically to a different pharmacy, notify our office through [facility] or by phone at [phone] option 4.     Si Usted Necesita Algo Despus de Su Visita  Tambin puede enviarnos un mensaje a travs de Clinical cytogeneticist. Por lo general respondemos a los mensajes de MyChart en el transcurso de 1 a 2 das hbiles.  Para renovar recetas, por favor pida a su farmacia que se ponga en contacto con nuestra oficina. Annie Sable de fax es [place] [phone].  Si tiene un asunto urgente cuando la clnica est cerrada y que no puede esperar hasta el siguiente da hbil, puede llamar/localizar a su doctor(a) al nmero que aparece a continuacin.   Por favor, tenga en cuenta que aunque hacemos todo lo posible para estar disponibles para asuntos urgentes fuera del horario de [place], no estamos disponibles las 24 horas del da, los 7 [address]  [address] de la [place].   Si tiene un problema urgente y no puede comunicarse con nosotros, puede optar por buscar atencin mdica  en el consultorio de su doctor(a), en una clnica privada, en un centro de atencin urgente o en una sala de emergencias.  Si tiene Engineer, drilling, por favor llame inmediatamente al 911 o vaya a la sala de emergencias.  Nmeros de bper  - Dr. Gwen Pounds: [phone]  - Dra. Roseanne Reno: [phone]  - Dr. Katrinka Blazing: [phone]   En caso de inclemencias del tiempo, por favor llame a Lacy Duverney principal al [phone] para una actualizacin sobre el [place] de cualquier retraso o cierre.  Consejos para la medicacin en dermatologa: Por favor, guarde las cajas en las que vienen los medicamentos de uso tpico para ayudarle a seguir las instrucciones sobre dnde y cmo usarlos. Las farmacias generalmente imprimen las instrucciones del medicamento slo en las cajas y no directamente en los tubos del [place].   Si su medicamento es muy caro, por favor, pngase en contacto con Rolm Gala llamando  al [phone] y presione la opcin 4 o envenos un mensaje a travs de Clinical cytogeneticist.   No podemos decirle cul ser su copago por los medicamentos por adelantado ya que esto es diferente dependiendo de la cobertura de su seguro. Sin embargo, es posible que podamos encontrar un medicamento sustituto a Audiological scientist un formulario para que el seguro cubra el medicamento que se considera necesario.   Si se requiere una autorizacin previa para que su compaa de seguros [place] su medicamento, por favor permtanos de 1 a 2 das hbiles para completar [address]  Los precios de los medicamentos varan con frecuencia dependiendo del Environmental consultant de dnde se surte la receta y alguna farmacias pueden ofrecer precios ms baratos.  El sitio web www.goodrx.com tiene cupones para medicamentos de Health and safety inspector. Los precios aqu no tienen en cuenta lo que podra costar con la ayuda del seguro (puede ser ms barato con su seguro), pero el sitio web puede darle el precio si  no utiliz [employer].  - Puede imprimir el cupn correspondiente y llevarlo con su receta a la farmacia.  - Tambin puede pasar por nuestra oficina durante el horario de atencin regular y Education officer, museum una tarjeta de cupones de GoodRx.  - Si necesita que su receta se enve electrnicamente a una farmacia diferente, informe a nuestra oficina a travs de MyChart de [place] o por telfono llamando al [phone] y presione la opcin 4.

## 2023-07-08 ENCOUNTER — Ambulatory Visit: Payer: BC Managed Care – PPO | Admitting: Dermatology

## 2023-12-16 ENCOUNTER — Ambulatory Visit: Payer: BC Managed Care – PPO | Admitting: Family Medicine

## 2023-12-16 ENCOUNTER — Encounter: Payer: Self-pay | Admitting: Family Medicine

## 2023-12-16 VITALS — BP 130/74 | HR 57 | Resp 16 | Ht 72.0 in | Wt 252.6 lb

## 2023-12-16 DIAGNOSIS — K2100 Gastro-esophageal reflux disease with esophagitis, without bleeding: Secondary | ICD-10-CM | POA: Diagnosis not present

## 2023-12-16 DIAGNOSIS — Z789 Other specified health status: Secondary | ICD-10-CM

## 2023-12-16 DIAGNOSIS — D696 Thrombocytopenia, unspecified: Secondary | ICD-10-CM | POA: Diagnosis not present

## 2023-12-16 DIAGNOSIS — E785 Hyperlipidemia, unspecified: Secondary | ICD-10-CM | POA: Diagnosis not present

## 2023-12-16 DIAGNOSIS — E538 Deficiency of other specified B group vitamins: Secondary | ICD-10-CM

## 2023-12-16 DIAGNOSIS — G4733 Obstructive sleep apnea (adult) (pediatric): Secondary | ICD-10-CM

## 2023-12-16 DIAGNOSIS — E1169 Type 2 diabetes mellitus with other specified complication: Secondary | ICD-10-CM

## 2023-12-16 DIAGNOSIS — R21 Rash and other nonspecific skin eruption: Secondary | ICD-10-CM

## 2023-12-16 LAB — POCT GLYCOSYLATED HEMOGLOBIN (HGB A1C): Hemoglobin A1C: 7.1 % — AB (ref 4.0–5.6)

## 2023-12-16 MED ORDER — VALACYCLOVIR HCL 500 MG PO TABS: 500.0000 mg | ORAL_TABLET | Freq: Three times a day (TID) | ORAL | 0 refills | Status: AC

## 2023-12-16 NOTE — Progress Notes (Addendum)
 Name: Ryan Ritter   MRN: [id number]    DOB: September 08, 1966   Date:12/16/2023       Progress Note  Subjective  Chief Complaint  Chief Complaint  Patient presents with   Medical Management of Chronic Issues   HPI   DMII: since March 2023  he has been using a CGM.  He is under the care of  Dr. Renae Fickle and off Evaristo Bury, taking novalog in am's and sometimes in the evening, and Metformin 1500 mg daily, he tried Ozempic but could not tolerate medication , she suggested him trying [facility] but he is afraid of trying a new medication secondary to severe nausea and diarrhea with Ozempic. Marland Kitchen He could not tolerate Rosuvastatin - affected his memory, LDL not at goal .  A1C today is up to  7.1 %. Dyslipidemia noticed but is on diet only cannot take statin therapy. We will recheck it and consider fish oil, tricor or zetia    OSA: he is using CPAP machine,  he does not snore when he uses the CPAP, he still feels tired when he wakes up, however he does not go to bed early, he only sleeps on average 5-6 hours per night and feels tired     Obesity: He tries to eat healthy and following a diabetic diet , his weight is up 9 lbs since Sepb 2024. He states not as active during winter months    Thrombocytopenia: reviewed previous labs with him, last level was stable at 27   Paternal grandfather had Multiple Myeloma and Maternal grandfather had leukemia. Seen by Dr. Cathie Hoops. 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 Al Corpus   METATARSAL OSTEOTOMY Left 03/02/2022   2nd Left - Dr. Al Corpus   tendinitis Left    TRIGGER FINGER RELEASE     vascectomy  08/2014    Family History  Problem Relation Age of Onset   Breast cancer Mother    Colon polyps Mother    Diabetes Father    Pancreatic cancer Father    Diabetes Brother    Diabetes Maternal Grandmother    Cancer Maternal Grandfather        leukemia   Leukemia Maternal Grandfather  Cancer Paternal Grandfather        multiple myeloma   Multiple myeloma Paternal Grandfather     Social History   Tobacco Use   Smoking status: Never   Smokeless tobacco: Former    Types: Chew    Quit date: 10/01/1988  Substance Use Topics   Alcohol use: No    Alcohol/week: 0.0 standard drinks of alcohol     Current Outpatient Medications:    Accu-Chek Softclix Lancets lancets, SMARTSIG:2 Topical Twice Daily, Disp: , Rfl:    Boron 3 MG CAPS, Take 1 capsule by mouth 3 (three) times daily., Disp: , Rfl:    Chromium 200 MCG CAPS, Take 200 mcg by mouth 2 (two) times daily. , Disp: , Rfl:    Cinnamon 500 MG TABS, Take 1,000 tablets by mouth 2 (two) times daily., Disp: , Rfl:    clindamycin (CLEOCIN-T) 1 % lotion, Apply topically daily. Bid to aa  bumps scalp, neck, Disp: 60 mL, Rfl: 4   clobetasol ointment (TEMOVATE) 0.05 %, Apply 1 Application topically 2 (two) times daily. Bid to fingers prn flares, avoid face, groin, axilla, Disp: 45 g, Rfl: 1   Cod Liver Oil 5000-500 UNIT/5ML OIL, Take by mouth., Disp: , Rfl:    Coenzyme Q10 (CO Q10) 200 MG CAPS, Take 200 mg by mouth daily., Disp: , Rfl:    Continuous Blood Gluc Sensor (DEXCOM G7 SENSOR) MISC, 1 each by Does not apply route as directed. Apply one every 10 days, Disp: 9 each, Rfl: 1   Cyanocobalamin (B-12) 1000 MCG SUBL, Place 1 tablet under the tongue 3 (three) times a week., Disp: 30 tablet, Rfl: 0   doxycycline (MONODOX) 100 MG capsule, Take 1 capsule (100 mg total) by mouth as directed. 1 po bid with food and drink for 2 weeks prn flares of bumps on scalp, neck, Disp: 30 capsule, Rfl: 2   EPINEPHrine 0.3 mg/0.3 mL IJ SOAJ injection, , Disp: , Rfl:    fluticasone (FLONASE) 50 MCG/ACT nasal spray, , Disp: , Rfl:    glucose blood (ACCU-CHEK GUIDE) test strip, USE TO TEST UP TO 4 TIMES A DAY, Disp: 100 strip, Rfl: 2   Insulin Pen Needle 32G X 4 MM MISC, 1 each by Does not apply route daily at 12 noon. Twice daily prn, Disp: 100 each, Rfl: 2   levocetirizine (XYZAL) 5 MG tablet, Take 5 mg by mouth every evening. , Disp: , Rfl:    metFORMIN (GLUCOPHAGE-XR) 750 MG 24 hr tablet, Take 1,500 mg by mouth daily with breakfast., Disp: , Rfl:    NOVOLOG FLEXPEN 100 UNIT/ML FlexPen, Inject 6-14 Units into the skin 2 (two) times daily as needed., Disp: , Rfl:    valACYclovir (VALTREX) 500 MG tablet, Take 1 tablet (500 mg total) by mouth 3 (three) times daily. Prn, Disp: 30 tablet, Rfl: 0  Allergies  Allergen Reactions   Benicar [Olmesartan] Other (See Comments)   Lisinopril Other (See Comments)    I personally reviewed active problem list, medication list, allergies with the patient/caregiver today.   ROS  Ten systems reviewed and is negative except as mentioned in HPI     Objective  Vitals:   12/16/23 1527  BP: 130/74  Pulse: (!) 57  Resp: 16  SpO2: 99%  Weight: 252 lb 9.6 oz (114.6 kg)  Height: 6' (1.829 m)    Body mass index is 34.26 kg/m.  Physical Exam  Constitutional: Patient appears well-developed and well-nourished. Obese  No distress.  HEENT:  head atraumatic, normocephalic, pupils equal and reactive to light,, neck supple Cardiovascular: Normal rate, regular rhythm and normal heart sounds.  No murmur heard. No BLE edema. Pulmonary/Chest: Effort normal and breath sounds normal. No respiratory distress. Abdominal: Soft.  There is no tenderness. Psychiatric: Patient has a normal mood and affect. behavior is normal. Judgment and thought content normal.   Recent Results (from the past 2160 hours)  POCT glycosylated hemoglobin (Hb A1C)     Status: Abnormal   Collection Time: 12/16/23  3:31 PM  Result Value Ref Range   Hemoglobin A1C 7.1 (A) 4.0 - 5.6 %   HbA1c POC (<> result, manual entry)     HbA1c, POC (prediabetic range)     HbA1c, POC (controlled diabetic range)       PHQ2/9:    12/16/2023    3:21 PM 06/11/2023    3:18 PM 06/04/2023    8:33 AM 03/12/2023    1:37 PM 01/30/2022    4:07 PM  Depression screen PHQ 2/9  Decreased Interest 0 0 0 0 0  Down, Depressed, Hopeless 0 0 0 0 0  PHQ - 2 Score 0 0 0 0 0  Altered sleeping 0 1 0 0 1  Tired, decreased energy 0 1 3 0 1  Change in appetite 0 0 1 0 0  Feeling bad or failure about yourself  0 0 0 0 0  Trouble concentrating 0 0 0 0 0  Moving slowly or fidgety/restless 0 0 0 0 0  Suicidal thoughts 0 0 0 0 0  PHQ-9 Score 0 2 4 0 2  Difficult doing work/chores Not difficult at all  Somewhat difficult  Not difficult at all    phq 9 is negative  Fall Risk:    06/11/2023    3:18 PM 03/12/2023    1:37 PM 05/28/2022    3:31 PM 01/30/2022    4:07 PM 12/12/2021    3:20 PM  Fall Risk   Falls in the past year? 0 0 0 0 0  Number falls in past yr: 0 0   0  Injury with Fall? 0 0   0  Risk  for fall due to : No Fall Risks No Fall Risks No Fall Risks No Fall Risks No Fall Risks  Follow up Falls prevention discussed Falls prevention discussed Falls prevention discussed Falls prevention discussed Falls prevention discussed     Assessment & Plan  1. Dyslipidemia associated with type 2 diabetes mellitus (HCC) (Primary)  - POCT glycosylated hemoglobin (Hb A1C)  Managed by Dr. Renae Fickle, next visit is virtual and we checked his A1C today   2. Thrombocytopenia (HCC)  Seen by hematologist in the past, last level stable, recheck with next labs   3. OSA on CPAP  Compliant   4. Gastroesophageal reflux disease with esophagitis without hemorrhage  stable  5. Statin intolerance  Discussed zetia, fish oil or tricor, he wants to hold off until next labs   6. Low serum vitamin B12  Continue supplementation   7. Penile rash  Prn rash - valACYclovir (VALTREX) 500 MG tablet; Take 1 tablet (500 mg total) by mouth 3 (three) times daily. Prn  Dispense: 30 tablet; Refill: 0

## 2024-01-30 ENCOUNTER — Ambulatory Visit: Admitting: Dermatology

## 2024-01-30 ENCOUNTER — Encounter: Payer: Self-pay | Admitting: Dermatology

## 2024-01-30 DIAGNOSIS — L249 Irritant contact dermatitis, unspecified cause: Secondary | ICD-10-CM

## 2024-01-30 DIAGNOSIS — L739 Follicular disorder, unspecified: Secondary | ICD-10-CM

## 2024-01-30 DIAGNOSIS — N492 Inflammatory disorders of scrotum: Secondary | ICD-10-CM | POA: Diagnosis not present

## 2024-01-30 DIAGNOSIS — L2489 Irritant contact dermatitis due to other agents: Secondary | ICD-10-CM

## 2024-01-30 DIAGNOSIS — B958 Unspecified staphylococcus as the cause of diseases classified elsewhere: Secondary | ICD-10-CM

## 2024-01-30 DIAGNOSIS — R21 Rash and other nonspecific skin eruption: Secondary | ICD-10-CM | POA: Diagnosis not present

## 2024-01-30 MED ORDER — CLINDAMYCIN PHOSPHATE 1 % EX LOTN: TOPICAL_LOTION | Freq: Every day | CUTANEOUS | 4 refills | Status: AC

## 2024-01-30 MED ORDER — DOXYCYCLINE MONOHYDRATE 100 MG PO CAPS: 100.0000 mg | ORAL_CAPSULE | ORAL | 2 refills | Status: AC

## 2024-01-30 MED ORDER — MUPIROCIN 2 % EX OINT: 1.0000 | TOPICAL_OINTMENT | Freq: Two times a day (BID) | CUTANEOUS | 2 refills | Status: DC

## 2024-01-30 NOTE — Patient Instructions (Addendum)
 For bumps on posterior neck and posterior scalp Restart Clindamycin  cr 2 times a day Restart Doxycycline  100mg  1 pill twice a day for 2 weeks when flared Start Hibiclens to wash area once daily        Due to recent changes in healthcare laws, you may see results of your pathology and/or laboratory studies on MyChart before the doctors have had a chance to review them. We understand that in some cases there may be results that are confusing or concerning to you. Please understand that not all results are received at the same time and often the doctors may need to interpret multiple results in order to provide you with the best plan of care or course of treatment. Therefore, we ask that you please give us  2 business days to thoroughly review all your results before contacting the office for clarification. Should we see a critical lab result, you will be contacted sooner.   If You Need Anything After Your Visit  If you have any questions or concerns for your doctor, please call our main line at [phone] and press option 4 to reach your doctor's medical assistant. If no one answers, please leave a voicemail as directed and we will return your call as soon as possible. Messages left after 4 pm will be answered the following business day.   You may also send us  a message via MyChart. We typically respond to MyChart messages within 1-2 business days.  For prescription refills, please ask your pharmacy to contact our office. Our fax number is [phone].  If you have an urgent issue when the clinic is closed that cannot wait until the next business day, you can page your doctor at the number below.    Please note that while we do our best to be available for urgent issues outside of office hours, we are not available 24/7.   If you have an urgent issue and are unable to reach us , you may choose to seek medical care at your doctor's office, retail clinic, urgent care center, or emergency  room.  If you have a medical emergency, please immediately call 911 or go to the emergency department.  Pager Numbers  - Dr. Bary Likes: [phone]  - Dr. Annette Barters: [phone]  - Dr. Felipe Horton: [phone]   In the event of inclement weather, please call our main line at [phone] for an update on the status of any delays or closures.  Dermatology Medication Tips: Please keep the boxes that topical medications come in in order to help keep track of the instructions about where and how to use these. Pharmacies typically print the medication instructions only on the boxes and not directly on the medication tubes.   If your medication is too expensive, please contact our office at [phone] option 4 or send us  a message through MyChart.   We are unable to tell what your co-pay for medications will be in advance as this is different depending on your insurance coverage. However, we may be able to find a substitute medication at lower cost or fill out paperwork to get insurance to cover a needed medication.   If a prior authorization is required to get your medication covered by your insurance company, please allow us  1-2 business days to complete this process.  Drug prices often vary depending on where the prescription is filled and some pharmacies may offer cheaper prices.  The website www.goodrx.com contains coupons for medications through different pharmacies. The prices here do not account for  what the cost may be with help from insurance (it may be cheaper with your insurance), but the website can give you the price if you did not use any insurance.  - You can print the associated coupon and take it with your prescription to the pharmacy.  - You may also stop by our office during regular business hours and pick up a GoodRx coupon card.  - If you need your prescription sent electronically to a different pharmacy, notify our office through [facility] or by phone at  [phone] option 4.     Si Usted Necesita Algo Despus de Su Visita  Tambin puede enviarnos un mensaje a travs de Clinical cytogeneticist. Por lo general respondemos a los mensajes de MyChart en el transcurso de 1 a 2 das hbiles.  Para renovar recetas, por favor pida a su farmacia que se ponga en contacto con nuestra oficina. Franz Jacks de fax es [place] [phone].  Si tiene un asunto urgente cuando la clnica est cerrada y que no puede esperar hasta el siguiente da hbil, puede llamar/localizar a su doctor(a) al nmero que aparece a continuacin.   Por favor, tenga en cuenta que aunque hacemos todo lo posible para estar disponibles para asuntos urgentes fuera del horario de Jane, no estamos disponibles las 24 horas del da, los 7 [address]  [address] de la [place].   Si tiene un problema urgente y no puede comunicarse con nosotros, puede optar por buscar atencin mdica  en el consultorio de su doctor(a), en una clnica privada, en un centro de atencin urgente o en una sala de emergencias.  Si tiene Engineer, drilling, por favor llame inmediatamente al 911 o vaya a la sala de emergencias.  Nmeros de bper  - Dr. Bary Likes: [phone]  - Dra. Annette Barters: [phone]  - Dr. Felipe Horton: [phone]   En caso de inclemencias del tiempo, por favor llame a Lajuan Pila principal al [phone] para una actualizacin sobre el [place] de cualquier retraso o cierre.  Consejos para la medicacin en dermatologa: Por favor, guarde las cajas en las que vienen los medicamentos de uso tpico para ayudarle a seguir las instrucciones sobre dnde y cmo usarlos. Las farmacias generalmente imprimen las instrucciones del medicamento slo en las cajas y no directamente en los tubos del [place].   Si su medicamento es muy caro, por favor, pngase en contacto con Bettyjane Brunet llamando al [phone] y presione la opcin 4 o envenos un mensaje a travs de Clinical cytogeneticist.   No podemos decirle cul ser su copago por los  medicamentos por adelantado ya que esto es diferente dependiendo de la cobertura de su seguro. Sin embargo, es posible que podamos encontrar un medicamento sustituto a Audiological scientist un formulario para que el seguro cubra el medicamento que se considera necesario.   Si se requiere una autorizacin previa para que su compaa de seguros [place] su medicamento, por favor permtanos de 1 a 2 das hbiles para completar este proceso.  Los precios de los medicamentos varan con frecuencia dependiendo del Environmental consultant de dnde se surte la receta y alguna farmacias pueden ofrecer precios ms baratos.  El sitio web www.goodrx.com tiene cupones para medicamentos de Health and safety inspector. Los precios aqu no tienen en cuenta lo que podra costar con la ayuda del seguro (puede ser ms barato con su seguro), pero el sitio web puede darle el precio si no utiliz Tourist information centre manager.  - Puede imprimir el cupn correspondiente y llevarlo con su receta a la farmacia.  - Tambin  puede pasar por nuestra oficina durante el horario de atencin regular y Education officer, museum una tarjeta de cupones de GoodRx.  - Si necesita que su receta se enve electrnicamente a una farmacia diferente, informe a nuestra oficina a travs de MyChart de [place] o por telfono llamando al [phone] y presione la opcin 4.

## 2024-01-30 NOTE — Progress Notes (Signed)
 Follow-Up Visit   Subjective  Ryan Ritter is a 58 y.o. 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   The following portions of the chart were reviewed this encounter and updated as appropriate: medications, allergies, medical history  Review of Systems:  No other skin or systemic complaints except as noted in HPI or Assessment and Plan.  Objective  Well appearing patient in no apparent distress; mood and affect are within normal limits.   A focused examination was performed of the following areas: groin  Relevant exam findings are noted in the Assessment and Plan.    Assessment & Plan   FOLLICULITIS, chronic flaring not at goal Post scalp, neck Exam: excoriated erythematous papules  Folliculitis occurs due to inflammation of the superficial hair follicle (pore), resulting in acne-like lesions (pus bumps). It can be infectious (bacterial, fungal) or noninfectious (shaving, tight clothing, heat/sweat, medications).  Folliculitis can be acute or chronic and recommended treatment depends on the underlying cause of folliculitis.  Treatment Plan: Restart Clindamycin  cream bid aa scalp, posterior neck Restart Doxycycline  100mg  1 po bid with food and drink for 2 weeks prn flares Start Hibiclens qd to wash area  Doxycycline  should be taken with food to prevent nausea. Do not lay down for 30 minutes after taking. Be cautious with sun exposure and use good sun protection while on this medication. Pregnant women should not take this medication.   IRRITANT DERMATITIS/red  scrotum syndrome, failed ketoconazole  scrotum Exam: well-demarcated square erythematous patch on anterior scrotum. No scale or purulence  Treatment Plan: Start Opzelura cream bid to aa scrotum, sample x 1 Lot [id number] exp 11/28/24 Discussed liner or pad between underwear and scrotum to decrease irritation  L nare inflamed papule concerning for staph infection Exam: erythematous papule on left medial inner alar rim  Ddx: staph infection  Treatment Plan: Aerobic culture today Start Doxycycline  1 po bid with food and drink for 2 weeks Start Mupirocin  oint bid until resolved  Doxycycline  should be taken with food to prevent nausea. Do not lay down for 30 minutes after taking. Be cautious with sun exposure and use good sun protection while on this medication. Pregnant women should not take this medication.    RASH AND OTHER NONSPECIFIC SKIN ERUPTION   Related Procedures Aerobic culture FOLLICULITIS   Related Medications clindamycin  (CLEOCIN -T) 1 % lotion Apply topically daily. Bid to aa bumps scalp, neck doxycycline  (MONODOX ) 100 MG capsule Take 1 capsule (100 mg total) by mouth as directed. 1 po bid with food and drink for 2 weeks prn flares of bumps on scalp, neck IRRITANT CONTACT DERMATITIS DUE TO OTHER AGENTS   STAPHYLOCOCCAL INFECTION   Related Procedures Aerobic culture Related Medications doxycycline  (MONODOX ) 100 MG capsule Take 1 capsule (100 mg total) by mouth as directed. 1 po bid with food and drink for 2 weeks prn flares of bumps on scalp, neck mupirocin  ointment (BACTROBAN ) 2 % Apply 1 Application topically 2 (two) times daily. Bid to nose until resolved  Return in about 1 month (around 03/01/2024) for Folliculitis f/u, groin rash  f/u.  I, Rollie Clipper, RMA, am acting as scribe for Harris Liming, MD .   Documentation: I have reviewed the above documentation for accuracy and completeness, and I agree with the above.  Harris Liming, MD

## 2024-02-04 ENCOUNTER — Encounter: Payer: Self-pay | Admitting: Dermatology

## 2024-02-04 LAB — AEROBIC CULTURE

## 2024-02-18 NOTE — Telephone Encounter (Signed)
 Patient called office. Next opening for Dr. Felipe Horton is June 3rd and patient already has appt. Will watch for openings. aw

## 2024-03-03 ENCOUNTER — Ambulatory Visit: Admitting: Dermatology

## 2024-03-03 DIAGNOSIS — L988 Other specified disorders of the skin and subcutaneous tissue: Secondary | ICD-10-CM

## 2024-03-03 DIAGNOSIS — R21 Rash and other nonspecific skin eruption: Secondary | ICD-10-CM

## 2024-03-03 NOTE — Patient Instructions (Signed)

## 2024-03-03 NOTE — Progress Notes (Unsigned)
   Follow-Up Visit   Subjective  Ryan Ritter is a 58 y.o. male who presents for the following: Rash on the scrotum didn't improve with Opzelura samples. Currently he is using OTC anti-fungal cream, which helps some, but doesn't resolve erythema, pt here today for a biopsy. Recheck bacterial infection of the L nare, pt finished antibiotics and is no longer using Mupirocin  ointment. Infection has cleared per patient.  The following portions of the chart were reviewed this encounter and updated as appropriate: medications, allergies, medical history  Review of Systems:  No other skin or systemic complaints except as noted in HPI or Assessment and Plan.  Objective  Well appearing patient in no apparent distress; mood and affect are within normal limits.   A focused examination was performed of the following areas: the face and scrotum   Relevant exam findings are noted in the Assessment and Plan.  scrotum well-demarcated square erythematous patch on anterior scrotum. No scale or purulence   Assessment & Plan   FOLLICULITIS Exam: post scalp and neck  Folliculitis occurs due to inflammation of the superficial hair follicle (pore), resulting in acne-like lesions (pus bumps). It can be infectious (bacterial, fungal) or noninfectious (shaving, tight clothing, heat/sweat, medications).  Folliculitis can be acute or chronic and recommended treatment depends on the underlying cause of folliculitis.  Treatment Plan: Continue topical clindamycin  to aa QD.  RASH AND OTHER NONSPECIFIC SKIN ERUPTION scrotum Skin / nail biopsy - scrotum Type of biopsy: incisional   Informed consent: discussed and consent obtained   Timeout: patient name, date of birth, surgical site, and procedure verified   Procedure prep:  Patient was prepped and draped in usual sterile fashion (the patient was cleaned and prepped) Prep type:  Isopropyl alcohol Anesthesia: the lesion was anesthetized in a standard fashion    Anesthetic:  1% lidocaine plain local infiltration Instrument used: scissors   Suture size:  4-0 Suture type: Prolene (polypropylene)   Hemostasis achieved with: suture, pressure and aluminum chloride   Outcome: patient tolerated procedure well   Post-procedure details: sterile dressing applied and wound care instructions given   Dressing type: bandage, petrolatum and pressure dressing    EPIDERMAL INCLUSION CYST Exam: Subcutaneous nodule at the back  Benign-appearing. Exam most consistent with an epidermal inclusion cyst. Discussed that a cyst is a benign growth that can grow over time and sometimes get irritated or inflamed. Recommend observation if it is not bothersome. Discussed option of surgical excision to remove it if it is growing, symptomatic, or other changes noted. Please call for new or changing lesions so they can be evaluated.   Return in about 1 week (around 03/10/2024) for follow up and suture removal.  I, Mara Seminole, CMA, am acting as scribe for Harris Liming, MD .   Documentation: I have reviewed the above documentation for accuracy and completeness, and I agree with the above.  Harris Liming, MD

## 2024-03-04 ENCOUNTER — Encounter: Payer: Self-pay | Admitting: Dermatology

## 2024-03-06 LAB — SURGICAL PATHOLOGY

## 2024-03-09 ENCOUNTER — Telehealth: Payer: Self-pay | Admitting: Dermatology

## 2024-03-09 ENCOUNTER — Ambulatory Visit: Payer: Self-pay | Admitting: Dermatology

## 2024-03-09 DIAGNOSIS — R208 Other disturbances of skin sensation: Secondary | ICD-10-CM

## 2024-03-09 MED ORDER — LIDOCAINE 5 % EX OINT: 1.0000 | TOPICAL_OINTMENT | Freq: Two times a day (BID) | CUTANEOUS | 11 refills | Status: DC | PRN

## 2024-03-09 NOTE — Telephone Encounter (Signed)
 Spoke to patient re: biopsy. Shows absence of pathology except telangiectasia. Clinical picture and near-normal pathology fit red scrotum syndrome. Idiopathic chronic irritation and burning. Possible link to chronic topical steroid use. No cure but not dangerous. Manage symptoms Tx lidocaine ointment vs compounded lido amitryp gabapen vs oral gabapentin Patient opts for lidocaine oinment  Patient mentioned contracting herpes from wife. Red scrotum and burning started 1-2 months before first herpes outbreak. Discussed that symptoms are unlikely to be caused by herpes but would send suppressive Rx to see if it helps if patient desires.   All questions answered

## 2024-03-10 ENCOUNTER — Ambulatory Visit

## 2024-03-10 DIAGNOSIS — Z4802 Encounter for removal of sutures: Secondary | ICD-10-CM

## 2024-03-10 DIAGNOSIS — L988 Other specified disorders of the skin and subcutaneous tissue: Secondary | ICD-10-CM

## 2024-03-10 NOTE — Progress Notes (Signed)
   Follow-Up Visit   Subjective  Ryan Ritter is a 58 y.o. male who presents for the following: Suture removal - L scrotum  Pathology showed absence of pathology except telangiectasia   The following portions of the chart were reviewed this encounter and updated as appropriate: medications, allergies, medical history  Review of Systems:  No other skin or systemic complaints except as noted in HPI or Assessment and Plan.  Objective  Well appearing patient in no apparent distress; mood and affect are within normal limits.  Areas Examined: The scrotum  Relevant physical exam findings are noted in the Assessment and Plan.    Assessment & Plan    Encounter for Removal of Sutures - Incision site is clean, dry and intact. - Wound cleansed, sutures removed, wound cleansed - Discussed pathology results showing absence of pathology except telangiectasia. Dr. Felipe Horton discussed pathology with patient yesterday via phone. - Patient advised to keep steri-strips dry until they fall off. - Scars remodel for a full year. - Once steri-strips fall off, patient can apply over-the-counter silicone scar cream once to twice a day to help with scar remodeling if desired. - Patient advised to call with any concerns or if they notice any new or changing lesions.  Return in about 6 months (around 09/09/2024) for TBSE and follow up.  Molly Angers CMA, AAMA

## 2024-03-10 NOTE — Patient Instructions (Signed)

## 2024-03-10 NOTE — Progress Notes (Deleted)
   Follow-Up Visit   Subjective  Ryan Ritter is a 58 y.o. male who presents for the following: Suture removal, discussed pathology results and treatment options over the phone.  The following portions of the chart were reviewed this encounter and updated as appropriate: medications, allergies, medical history  Review of Systems:  No other skin or systemic complaints except as noted in HPI or Assessment and Plan.  Objective  Well appearing patient in no apparent distress; mood and affect are within normal limits.   A focused examination was performed of the following areas: the scrotum   Relevant exam findings are noted in the Assessment and Plan.    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 Possible link to chronic topical steroid use. No cure but not dangerous. Manage symptoms Tx lidocaine ointment vs compounded lido amitryp gabapen vs oral gabapentin Patient opts for lidocaine oinment   Patient mentioned contracting herpes from wife. Red scrotum and burning started 1-2 months before first herpes outbreak. Discussed that symptoms are unlikely to be caused by herpes but would send suppressive Rx to see if it helps if patient desires.   Encounter for Removal of Sutures - Incision site at the R scrotum is clean, dry and intact - Wound cleansed, sutures removed, wound cleansed and steri strips applied.  - Discussed pathology results showing ***  - Patient advised to keep steri-strips dry until they fall off. - Scars remodel for a full year. - Once steri-strips fall off, patient can apply over-the-counter silicone scar cream each night to help with scar remodeling if desired. - Patient advised to call with any concerns or if they notice any new or changing lesions.   No follow-ups on file.  Arlinda Lais, CMA, am acting as scribe for Harris Liming, MD .   Documentation: I have  reviewed the above documentation for accuracy and completeness, and I agree with the above.  Harris Liming, MD

## 2024-03-11 ENCOUNTER — Ambulatory Visit: Admitting: Dermatology

## 2024-05-08 LAB — HM DIABETES EYE EXAM

## 2024-06-19 NOTE — Patient Instructions (Signed)

## 2024-06-22 ENCOUNTER — Encounter: Payer: Self-pay | Admitting: Family Medicine

## 2024-06-22 ENCOUNTER — Ambulatory Visit: Payer: Self-pay | Admitting: Family Medicine

## 2024-06-22 VITALS — BP 116/64 | HR 60 | Resp 16 | Ht 72.0 in | Wt 234.5 lb

## 2024-06-22 DIAGNOSIS — Z0001 Encounter for general adult medical examination with abnormal findings: Secondary | ICD-10-CM | POA: Diagnosis not present

## 2024-06-22 DIAGNOSIS — Z125 Encounter for screening for malignant neoplasm of prostate: Secondary | ICD-10-CM | POA: Diagnosis not present

## 2024-06-22 DIAGNOSIS — E538 Deficiency of other specified B group vitamins: Secondary | ICD-10-CM

## 2024-06-22 DIAGNOSIS — J3081 Allergic rhinitis due to animal (cat) (dog) hair and dander: Secondary | ICD-10-CM | POA: Insufficient documentation

## 2024-06-22 DIAGNOSIS — Z789 Other specified health status: Secondary | ICD-10-CM

## 2024-06-22 DIAGNOSIS — E291 Testicular hypofunction: Secondary | ICD-10-CM

## 2024-06-22 DIAGNOSIS — E1169 Type 2 diabetes mellitus with other specified complication: Secondary | ICD-10-CM

## 2024-06-22 DIAGNOSIS — Z1211 Encounter for screening for malignant neoplasm of colon: Secondary | ICD-10-CM

## 2024-06-22 DIAGNOSIS — Z79899 Other long term (current) drug therapy: Secondary | ICD-10-CM

## 2024-06-22 DIAGNOSIS — E785 Hyperlipidemia, unspecified: Secondary | ICD-10-CM

## 2024-06-22 DIAGNOSIS — Z0000 Encounter for general adult medical examination without abnormal findings: Secondary | ICD-10-CM

## 2024-06-22 NOTE — Progress Notes (Signed)
 Name: Ryan Ritter   MRN: [id number]    DOB: [birth year]   Date:06/22/2024       Progress Note  Subjective  Chief Complaint  Chief Complaint  Patient presents with   Annual Exam    HPI  Patient presents for annual CPE .   IPSS     Row Name 06/22/24 9185         International Prostate Symptom Score   How often have you had the sensation of not emptying your bladder? Not at All     How often have you had to urinate less than every two hours? Not at All     How often have you found you stopped and started again several times when you urinated? Less than 1 in 5 times     How often have you found it difficult to postpone urination? Not at All     How often have you had a weak urinary stream? About half the time     How often have you had to strain to start urination? Not at All     How many times did you typically get up at night to urinate? 1 Time     Total IPSS Score 5       Quality of Life due to urinary symptoms   If you were to spend the rest of your life with your urinary condition just the way it is now how would you feel about that? Mostly Satisfied        Diet: he eats a very low carbohydrate diet.  Exercise: walking daily for about one mile  Last Dental Exam: up to date Last Eye Exam: up to date   Depression: phq 9 is negative    06/22/2024    8:14 AM 12/16/2023    3:21 PM 06/11/2023    3:18 PM 06/04/2023    8:33 AM 03/12/2023    1:37 PM  Depression screen PHQ 2/9  Decreased Interest 0 0 0 0 0  Down, Depressed, Hopeless 0 0 0 0 0  PHQ - 2 Score 0 0 0 0 0  Altered sleeping  0 1 0 0  Tired, decreased energy  0 1 3 0  Change in appetite  0 0 1 0  Feeling bad or failure about yourself   0 0 0 0  Trouble concentrating  0 0 0 0  Moving slowly or fidgety/restless  0 0 0 0  Suicidal thoughts  0 0 0 0  PHQ-9 Score  0 2 4 0  Difficult doing work/chores  Not difficult at all  Somewhat difficult     Hypertension:  BP Readings from Last 3 Encounters:  06/22/24 116/64   12/16/23 130/74  07/04/23 111/66    Obesity: Wt Readings from Last 3 Encounters:  06/22/24 234 lb 8 oz (106.4 kg)  12/16/23 252 lb 9.6 oz (114.6 kg)  06/11/23 243 lb (110.2 kg)   BMI Readings from Last 3 Encounters:  06/22/24 31.80 kg/m  12/16/23 34.26 kg/m  06/11/23 32.96 kg/m     Flowsheet Row Office Visit from 06/22/2024 in [facility]  AUDIT-C Score 0    Married STD testing and prevention (HIV/chl/gon/syphilis): not applicable Sexual history: no problems Hep C Screening: completed Skin cancer: seeing Dermatologist  Colorectal cancer: he will schedule it soon, behind  Prostate cancer:  yes Lab Results  Component Value Date   PSA 0.38 03/12/2023   PSA 0.5 09/10/2019   PSA 0.4 02/25/2018  Lung cancer:  Low Dose CT Chest recommended if Age 41-80 years, 30 pack-year currently smoking OR have quit w/in 15years. Patient  is not a candidate for screening   AAA: The USPSTF recommends one-time screening with ultrasonography in men ages 64 to 75 years who have ever smoked. Patient   is not a candidate for screening  ECG:  2024  Vaccines: reviewed with the patient. He will get flu and PCV 20 next visit   Advanced Care Planning: A voluntary discussion about advance care planning including the explanation and discussion of advance directives.  Discussed health care proxy and Living will, and the patient was able to identify a health care proxy as wife .  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 Verta   METATARSAL OSTEOTOMY Left 03/02/2022   2nd Left - Dr. Verta   tendinitis Left    TRIGGER FINGER RELEASE     vascectomy  08/2014    Family History  Problem Relation Age of Onset   Breast cancer Mother    Colon polyps Mother    Diabetes Father    Pancreatic cancer Father    Cancer Father    Hearing loss Father    Diabetes Brother    Diabetes Maternal Grandmother    Cancer Maternal Grandfather        leukemia   Leukemia Maternal Grandfather    Cancer Paternal Grandfather        multiple myeloma   Multiple myeloma Paternal Grandfather    Diabetes Paternal Grandmother     Social History   Socioeconomic History   Marital status: Married    Spouse name: Not on file   Number of children: 1   Years of education: Not on file   Highest education level: Not on file  Occupational History   Not on file  Tobacco Use   Smoking status: Never   Smokeless tobacco: Former    Types: Chew    Quit date: 10/01/1988  Vaping Use   Vaping status: Never Used  Substance and Sexual Activity   Alcohol use: No    Alcohol/week: 0.0 standard drinks of alcohol   Drug use: No   Sexual activity: Yes    Partners: Female  Other Topics Concern   Not on file  Social History Narrative   Not on file   Social Drivers of Health   Financial Resource Strain: Low Risk  (06/22/2024)   Overall Financial Resource Strain (CARDIA)    Difficulty of Paying Living  Expenses:  Not hard at all  Food Insecurity: No Food Insecurity (06/22/2024)   Hunger Vital Sign    Worried About Running Out of Food in the Last Year: Never true    Ran Out of Food in the Last Year: Never true  Transportation Needs: No Transportation Needs (06/22/2024)   PRAPARE - Administrator, Civil Service (Medical): No    Lack of Transportation (Non-Medical): No  Physical Activity: Insufficiently Active (06/22/2024)   Exercise Vital Sign    Days of Exercise per Week: 5 days    Minutes of Exercise per Session: 20 min  Stress: No Stress Concern Present (06/22/2024)   [employer] of Occupational Health - Occupational Stress Questionnaire    Feeling of Stress: Only a little  Social Connections: Socially Integrated (06/22/2024)   Social Connection and Isolation Panel    Frequency of Communication with Friends and Family: More than three times a week    Frequency of Social Gatherings with Friends and Family: Once a week    Attends Religious Services: More than 4 times per year    Active Member of [employer] or Organizations: Yes    Attends Banker Meetings: More than 4 times per year    Marital Status: Married  Catering manager Violence: Not At Risk (06/22/2024)   Humiliation, Afraid, Rape, and Kick questionnaire    Fear of Current or Ex-Partner: No    Emotionally Abused: No    Physically Abused: No    Sexually Abused: No     Current Outpatient Medications:    Accu-Chek Softclix Lancets lancets, SMARTSIG:2 Topical Twice Daily, Disp: , Rfl:    Boron 3 MG CAPS, Take 1 capsule by mouth 3 (three) times daily., Disp: , Rfl:    Chromium 200 MCG CAPS, Take 200 mcg by mouth 2 (two) times daily. , Disp: , Rfl:    clindamycin  (CLEOCIN -T) 1 % lotion, Apply topically daily. Bid to aa bumps scalp, neck, Disp: 60 mL, Rfl: 4   Coenzyme Q10 (CO Q10) 200 MG CAPS, Take 200 mg by mouth daily., Disp: , Rfl:    Continuous Blood Gluc Sensor (DEXCOM G7 SENSOR) MISC, 1 each by Does not apply  route as directed. Apply one every 10 days, Disp: 9 each, Rfl: 1   Cyanocobalamin  (B-12) 1000 MCG SUBL, Place 1 tablet under the tongue 3 (three) times a week., Disp: 30 tablet, Rfl: 0   doxycycline  (MONODOX ) 100 MG capsule, Take 1 capsule (100 mg total) by mouth as directed. 1 po bid with food and drink for 2 weeks prn flares of bumps on scalp, neck (Patient taking differently: Take 100 mg by mouth as needed (Rash). 1 po bid with food and drink for 2 weeks prn flares of bumps on scalp, neck), Disp: 30 capsule, Rfl: 2   EPINEPHrine 0.3 mg/0.3 mL IJ SOAJ injection, , Disp: , Rfl:    glucose blood (ACCU-CHEK GUIDE) test strip, USE TO TEST UP TO 4 TIMES A DAY, Disp: 100 strip, Rfl: 2   Insulin  Pen Needle 32G X 4 MM MISC, 1 each by Does not apply route daily at 12 noon. Twice daily prn, Disp: 100 each, Rfl: 2   levocetirizine (XYZAL) 5 MG tablet, Take 5 mg by mouth every evening. , Disp: , Rfl:    lidocaine  (XYLOCAINE ) 5 % ointment, Apply 1 Application topically 2 (two) times daily as needed., Disp: 30 g, Rfl: 11   metFORMIN  (GLUCOPHAGE -XR) 750 MG 24 hr tablet, Take 1,500 mg by  mouth daily with breakfast., Disp: , Rfl:    NOVOLOG FLEXPEN 100 UNIT/ML FlexPen, Inject 6-14 Units into the skin 2 (two) times daily as needed., Disp: , Rfl:    valACYclovir  (VALTREX ) 500 MG tablet, Take 1 tablet (500 mg total) by mouth 3 (three) times daily. Prn, Disp: 30 tablet, Rfl: 0   Cinnamon 500 MG TABS, Take 1,000 tablets by mouth 2 (two) times daily. (Patient not taking: Reported on 06/22/2024), Disp: , Rfl:    clobetasol  ointment (TEMOVATE ) 0.05 %, Apply 1 Application topically 2 (two) times daily. Bid to fingers prn flares, avoid face, groin, axilla (Patient not taking: Reported on 06/22/2024), Disp: 45 g, Rfl: 1   Cod Liver Oil 5000-500 UNIT/5ML OIL, Take by mouth. (Patient not taking: Reported on 06/22/2024), Disp: , Rfl:    fluticasone (FLONASE) 50 MCG/ACT nasal spray, , Disp: , Rfl:    mupirocin  ointment (BACTROBAN ) 2  %, Apply 1 Application topically 2 (two) times daily. Bid to nose until resolved (Patient not taking: Reported on 06/22/2024), Disp: 22 g, Rfl: 2  Allergies  Allergen Reactions   Benicar [Olmesartan] Other (See Comments)   Lisinopril Other (See Comments)     ROS  Constitutional: Negative for fever or weight change.  Respiratory: Negative for cough and shortness of breath.   Cardiovascular: Negative for chest pain or palpitations.  Gastrointestinal: Negative for abdominal pain, no bowel changes.  Musculoskeletal: Negative for gait problem or joint swelling.  Skin: scrotum rash , seen by dermatologist and stable Neurological: Negative for dizziness or headache.  No other specific complaints in a complete review of systems (except as listed in HPI above).    Objective  Vitals:   06/22/24 0818  BP: 116/64  Pulse: 60  Resp: 16  SpO2: 99%  Weight: 234 lb 8 oz (106.4 kg)  Height: 6' (1.829 m)    Body mass index is 31.8 kg/m.  Physical Exam  Constitutional: Patient appears well-developed and well-nourished. No distress.  HENT: Head: Normocephalic and atraumatic. Ears: B TMs ok, no erythema or effusion; Nose: Nose normal. Mouth/Throat: Oropharynx is clear and moist. No oropharyngeal exudate.  Eyes: Conjunctivae and EOM are normal. Pupils are equal, round, and reactive to light. No scleral icterus.  Neck: Normal range of motion. Neck supple. No JVD present. No thyromegaly present.  Cardiovascular: Normal rate, regular rhythm and normal heart sounds.  No murmur heard. No lower extremity edema Pulmonary/Chest: Effort normal and breath sounds normal. No respiratory distress. Abdominal: Soft. Bowel sounds are normal, no distension. There is no tenderness. no masses MALE GENITALIA: Normal descended testes bilaterally, no masses palpated, no hernias, no lesions, no discharge RECTAL: Prostate slightly enlarged, normal consistency , no rectal masses or hemorrhoids Musculoskeletal: Normal  range of motion, no joint effusions. No gross deformities Neurological: he is alert and oriented to person, place, and time. No cranial nerve deficit. Coordination, balance, strength, speech and gait are normal.  Skin: Skin is warm and dry.  Rash on scrotum noticed  Psychiatric: Patient has a normal mood and affect. behavior is normal. Judgment and thought content normal.     Assessment & Plan 1. Well adult exam (Primary)  - Urine Microalbumin w/creat. ratio - CBC with Differential/Platelet - Comprehensive metabolic panel with GFR - Hemoglobin A1c - Pneumococcal conjugate vaccine 20-valent (Prevnar 20) - HM Diabetes Foot Exam - PSA - Lipoprotein Fractionation, NMR with Lipid Panel (Triglycerides/HDL-C) - Testosterone ,Free and Total  2. Dyslipidemia associated with type 2 diabetes mellitus (HCC)  - Urine Microalbumin  w/creat. ratio - Hemoglobin A1c - HM Diabetes Foot Exam - Lipoprotein Fractionation, NMR with Lipid Panel (Triglycerides/HDL-C)  3. Low serum vitamin B12  - B12 and Folate Panel  4. Screening for prostate cancer  - PSA  5. Hypogonadism in male  - Testosterone ,Free and Total    -Prostate cancer screening and PSA options (with potential risks and benefits of testing vs not testing) were discussed along with recent recs/guidelines. -USPSTF grade A and B recommendations reviewed with patient; age-appropriate recommendations, preventive care, screening tests, etc discussed and encouraged; healthy living encouraged; see AVS for patient education given to patient -Discussed importance of 150 minutes of physical activity weekly, eat two servings of fish weekly, eat one serving of tree nuts ( cashews, pistachios, pecans, almonds.SABRA) every other day, eat 6 servings of fruit/vegetables daily and drink plenty of water and avoid sweet beverages.  -Reviewed Health Maintenance: yes

## 2024-06-25 ENCOUNTER — Ambulatory Visit: Payer: Self-pay | Admitting: Family Medicine

## 2024-06-27 LAB — CBC WITH DIFFERENTIAL/PLATELET
Absolute Lymphocytes: 964 {cells}/uL (ref 850–3900)
Absolute Monocytes: 413 {cells}/uL (ref 200–950)
Basophils Absolute: 31 {cells}/uL (ref 0–200)
Basophils Relative: 0.6 %
Eosinophils Absolute: 168 {cells}/uL (ref 15–500)
Eosinophils Relative: 3.3 %
HCT: 45.7 % (ref 38.5–50.0)
Hemoglobin: 15 g/dL (ref 13.2–17.1)
MCH: 30.1 pg (ref 27.0–33.0)
MCHC: 32.8 g/dL (ref 32.0–36.0)
MCV: 91.8 fL (ref 80.0–100.0)
MPV: 10.8 fL (ref 7.5–12.5)
Monocytes Relative: 8.1 %
Neutro Abs: 3524 {cells}/uL (ref 1500–7800)
Neutrophils Relative %: 69.1 %
Platelets: 140 Thousand/uL (ref 140–400)
RBC: 4.98 Million/uL (ref 4.20–5.80)
RDW: 13 % (ref 11.0–15.0)
Total Lymphocyte: 18.9 %
WBC: 5.1 Thousand/uL (ref 3.8–10.8)

## 2024-06-27 LAB — COMPREHENSIVE METABOLIC PANEL WITH GFR
AG Ratio: 2 (calc) (ref 1.0–2.5)
ALT: 16 U/L (ref 9–46)
AST: 14 U/L (ref 10–35)
Albumin: 4.7 g/dL (ref 3.6–5.1)
Alkaline phosphatase (APISO): 36 U/L (ref 35–144)
BUN/Creatinine Ratio: 35 (calc) — ABNORMAL HIGH (ref 6–22)
BUN: 31 mg/dL — ABNORMAL HIGH (ref 7–25)
CO2: 26 mmol/L (ref 20–32)
Calcium: 9.4 mg/dL (ref 8.6–10.3)
Chloride: 101 mmol/L (ref 98–110)
Creat: 0.89 mg/dL (ref 0.70–1.30)
Globulin: 2.4 g/dL (ref 1.9–3.7)
Glucose, Bld: 194 mg/dL — ABNORMAL HIGH (ref 65–99)
Potassium: 4.7 mmol/L (ref 3.5–5.3)
Sodium: 134 mmol/L — ABNORMAL LOW (ref 135–146)
Total Bilirubin: 0.5 mg/dL (ref 0.2–1.2)
Total Protein: 7.1 g/dL (ref 6.1–8.1)
eGFR: 99 mL/min/1.73m2 (ref >=60)

## 2024-06-27 LAB — LIPOPROTEIN FRACTIONATION, NMR W/ LIPID PNL
CHOL/HDL C: 4.6 calc (ref <5.0)
CHOLESTEROL, TOTAL: 203 mg/dL — ABNORMAL HIGH (ref <200)
HDL CHOLESTEROL: 44 mg/dL (ref >39)
HDL P: 36.6 umol/L (ref >32.8)
HDL Size: 8.4 nm — ABNORMAL LOW (ref >9.0)
LARGE HDL P: <3 umol/L — ABNORMAL LOW (ref >7.2)
LARGE VLDL P: 3 nmol/L (ref <3.7)
LDL CHOLESTEROL: 138 mg/dL — ABNORMAL HIGH (ref <100)
LDL P: 1969 nmol/L — ABNORMAL HIGH (ref <935)
LDL SIZE: 20.9 nm (ref >20.5)
NON HDL CHOLESTEROL: 159 mg/dL — ABNORMAL HIGH (ref <130)
SMALL LDL P: 916 nmol/L — ABNORMAL HIGH (ref <467)
TG/HDL C: 2.4 calc — ABNORMAL HIGH (ref <2.0)
TRIGLYCERIDES: 105 mg/dL (ref <150)
VLDL Size: 46.6 nm (ref <47.1)

## 2024-06-27 LAB — HEMOGLOBIN A1C
Hgb A1c MFr Bld: 6.6 % — ABNORMAL HIGH (ref <5.7)
Mean Plasma Glucose: 143 mg/dL
eAG (mmol/L): 7.9 mmol/L

## 2024-06-27 LAB — B12 AND FOLATE PANEL
Folate: 6.8 ng/mL
Vitamin B-12: 822 pg/mL (ref 200–1100)

## 2024-06-27 LAB — MICROALBUMIN / CREATININE URINE RATIO
Creatinine, Urine: 87 mg/dL (ref 20–320)
Microalb Creat Ratio: 7 mg/g{creat} (ref <30)
Microalb, Ur: 0.6 mg/dL

## 2024-06-27 LAB — TESTOSTERONE, FREE & TOTAL
Free Testosterone: 79 pg/mL (ref 35.0–155.0)
Testosterone, Total, LC-MS-MS: 439 ng/dL (ref 250–1100)

## 2024-06-27 LAB — PSA: PSA: 0.52 ng/mL (ref <=4.00)

## 2024-06-29 ENCOUNTER — Encounter: Payer: Self-pay | Admitting: Family Medicine

## 2024-06-29 ENCOUNTER — Ambulatory Visit: Admitting: Family Medicine

## 2024-06-29 VITALS — BP 124/72 | HR 59 | Resp 16 | Ht 72.0 in | Wt 235.3 lb

## 2024-06-29 DIAGNOSIS — Z794 Long term (current) use of insulin: Secondary | ICD-10-CM

## 2024-06-29 DIAGNOSIS — Z23 Encounter for immunization: Secondary | ICD-10-CM

## 2024-06-29 DIAGNOSIS — E538 Deficiency of other specified B group vitamins: Secondary | ICD-10-CM | POA: Diagnosis not present

## 2024-06-29 DIAGNOSIS — K2100 Gastro-esophageal reflux disease with esophagitis, without bleeding: Secondary | ICD-10-CM | POA: Diagnosis not present

## 2024-06-29 DIAGNOSIS — E1169 Type 2 diabetes mellitus with other specified complication: Secondary | ICD-10-CM

## 2024-06-29 DIAGNOSIS — E785 Hyperlipidemia, unspecified: Secondary | ICD-10-CM

## 2024-06-29 DIAGNOSIS — G4733 Obstructive sleep apnea (adult) (pediatric): Secondary | ICD-10-CM

## 2024-06-29 MED ORDER — B-12 1000 MCG SL SUBL: 1.0000 | SUBLINGUAL_TABLET | SUBLINGUAL | 3 refills | Status: AC

## 2024-06-29 NOTE — Patient Instructions (Signed)
 Nexlizet or zetia 

## 2024-06-29 NOTE — Progress Notes (Signed)
 Name: Ryan Ritter   MRN: [id number]    DOB: [birth year]   Date:06/29/2024       Progress Note  Subjective  Chief Complaint  Chief Complaint  Patient presents with   Medical Management of Chronic Issues   Discussed the use of AI scribe software for clinical note transcription with the patient, who gave verbal consent to proceed.  History of Present Illness Ryan Ritter is a 58 year old male with diabetes who presents for a follow-up visit to review lab results and discuss ongoing management of his conditions.  He manages his diabetes with NovoLog insulin  before meals and metformin  500 mg twice daily. His blood sugar control has improved, and he has not needed additional insulin  with supper. He reports frequent urination since his diabetes diagnosis but denies polyphagia or polydipsia.  Recent lab work shows an A1c of 6.6%. He has a history of microalbuminuria but last urine micro back to normal, also has dyslipidemia associated with DM  He also has a history of low platelet counts, which have normalized recently and has seen hematologist in the past.   He has a history of elevated cholesterol levels and previously experienced cognitive side effects from Crestor , leading to its discontinuation. He is not currently taking any statins and prefers to manage his cholesterol through diet and lifestyle changes. His recent lipid panel shows high LDL particle size and low HDL functionality.  He uses a CPAP machine for obstructive sleep apnea and reports consistent nightly use. He has not seen a specialist for this condition in a while.  He has a history of osteoarthritis in his hip, which is currently stable. He also experiences occasional folliculitis on his neck, for which he uses doxycycline  as needed.    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 Verta   METATARSAL OSTEOTOMY Left 03/02/2022   2nd Left - Dr. Verta   tendinitis Left    TRIGGER FINGER RELEASE     vascectomy  08/2014    Family History  Problem Relation Age of Onset   Breast cancer Mother    Colon polyps Mother    Diabetes Father    Pancreatic cancer Father    Cancer Father    Hearing loss Father    Diabetes Brother    Diabetes Maternal Grandmother    Cancer Maternal Grandfather        leukemia   Leukemia Maternal Grandfather    Cancer Paternal Grandfather        multiple myeloma   Multiple  myeloma Paternal Grandfather    Diabetes Paternal Grandmother     Social History   Tobacco Use   Smoking status: Never   Smokeless tobacco: Former    Types: Chew    Quit date: 10/01/1988  Substance Use Topics   Alcohol use: No    Alcohol/week: 0.0 standard drinks of alcohol     Current Outpatient Medications:    Accu-Chek Softclix Lancets lancets, SMARTSIG:2 Topical Twice Daily, Disp: , Rfl:    Boron 3 MG CAPS, Take 1 capsule by mouth 3 (three) times daily., Disp: , Rfl:    Chromium 200 MCG CAPS, Take 200 mcg by mouth 2 (two) times daily. , Disp:  , Rfl:    clindamycin  (CLEOCIN -T) 1 % lotion, Apply topically daily. Bid to aa bumps scalp, neck, Disp: 60 mL, Rfl: 4   Coenzyme Q10 (CO Q10) 200 MG CAPS, Take 200 mg by mouth daily., Disp: , Rfl:    Continuous Blood Gluc Sensor (DEXCOM G7 SENSOR) MISC, 1 each by Does not apply route as directed. Apply one every 10 days, Disp: 9 each, Rfl: 1   doxycycline  (MONODOX ) 100 MG capsule, Take 1 capsule (100 mg total) by mouth as directed. 1 po bid with food and drink for 2 weeks prn flares of bumps on scalp, neck (Patient taking differently: Take 100 mg by mouth as needed (Rash). 1 po bid with food and drink for 2 weeks prn flares of bumps on scalp, neck), Disp: 30 capsule, Rfl: 2   EPINEPHrine 0.3 mg/0.3 mL IJ SOAJ injection, , Disp: , Rfl:    glucose blood (ACCU-CHEK GUIDE) test strip, USE TO TEST UP TO 4 TIMES A DAY, Disp: 100 strip, Rfl: 2   Insulin  Pen Needle 32G X 4 MM MISC, 1 each by Does not apply route daily at 12 noon. Twice daily prn, Disp: 100 each, Rfl: 2   levocetirizine (XYZAL) 5 MG tablet, Take 5 mg by mouth every evening. , Disp: , Rfl:    lidocaine  (XYLOCAINE ) 5 % ointment, Apply 1 Application topically 2 (two) times daily as needed., Disp: 30 g, Rfl: 11   metFORMIN  (GLUCOPHAGE -XR) 750 MG 24 hr tablet, Take 1,500 mg by mouth daily with breakfast., Disp: , Rfl:    NOVOLOG FLEXPEN 100 UNIT/ML FlexPen, Inject 6-14 Units into the skin 2 (two) times daily as needed., Disp: , Rfl:    valACYclovir  (VALTREX ) 500 MG tablet, Take 1 tablet (500 mg total) by mouth 3 (three) times daily. Prn, Disp: 30 tablet, Rfl: 0   Cyanocobalamin  (B-12) 1000 MCG SUBL, Place 1 tablet under the tongue 3 (three) times a week., Disp: 100 tablet, Rfl: 3  Allergies  Allergen Reactions   Benicar [Olmesartan] Other (See Comments)   Crestor  [Rosuvastatin ] Other (See Comments)    Cognitive dysfunction   Lisinopril Other (See Comments)    I personally reviewed active problem list, medication list, allergies with the  patient/caregiver today.   ROS  Ten systems reviewed and is negative except as mentioned in HPI   Objective Physical Exam  CONSTITUTIONAL: Patient appears well-developed and well-nourished. No distress. HEENT: Head atraumatic, normocephalic, neck supple. CARDIOVASCULAR: Normal rate, regular rhythm and normal heart sounds. No murmur heard. No BLE edema. PULMONARY: Effort normal and breath sounds normal. No respiratory distress. ABDOMINAL: There is no tenderness or distention. PSYCHIATRIC: Patient has a normal mood and affect. Behavior is normal. Judgment and thought content normal.   Vitals:   06/29/24 1509  BP: 124/72  Pulse: (!) 59  Resp:  16  SpO2: 99%  Weight: 235 lb 4.8 oz (106.7 kg)  Height: 6' (1.829 m)    Body mass index is 31.91 kg/m.  Recent Results (from the past 2160 hours)  HM DIABETES EYE EXAM     Status: None   Collection Time: 05/08/24  7:31 AM  Result Value Ref Range   HM Diabetic Eye Exam No Retinopathy No Retinopathy    Comment: Abstracted by HIM  Urine Microalbumin w/creat. ratio     Status: None   Collection Time: 06/22/24  9:13 AM  Result Value Ref Range   Creatinine, Urine 87 20 - 320 mg/dL   Microalb, Ur 0.6 mg/dL    Comment: Reference Range Not established    Microalb Creat Ratio 7 <30 mg/g creat    Comment: . The ADA defines abnormalities in albumin excretion as follows: SABRA Albuminuria Category        Result (mg/g creatinine) . Normal to Mildly increased   <30 Moderately increased         30-299  Severely increased           > OR = 300 . The ADA recommends that at least two of three specimens collected within a 3-6 month period be abnormal before considering a patient to be within a diagnostic category.   CBC with Differential/Platelet     Status: None   Collection Time: 06/22/24  9:13 AM  Result Value Ref Range   WBC 5.1 3.8 - 10.8 Thousand/uL   RBC 4.98 4.20 - 5.80 Million/uL   Hemoglobin 15.0 13.2 - 17.1 g/dL   HCT 54.2 61.4  - 49.9 %   MCV 91.8 80.0 - 100.0 fL   MCH 30.1 27.0 - 33.0 pg   MCHC 32.8 32.0 - 36.0 g/dL    Comment: For adults, a slight decrease in the calculated MCHC value (in the range of 30 to 32 g/dL) is most likely not clinically significant; however, it should be interpreted with caution in correlation with other red cell parameters and the patient's clinical condition.    RDW 13.0 11.0 - 15.0 %   Platelets 140 140 - 400 Thousand/uL   MPV 10.8 7.5 - 12.5 fL   Neutro Abs 3,524 1,500 - 7,800 cells/uL   Absolute Lymphocytes 964 850 - 3,900 cells/uL   Absolute Monocytes 413 200 - 950 cells/uL   Eosinophils Absolute 168 15 - 500 cells/uL   Basophils Absolute 31 0 - 200 cells/uL   Neutrophils Relative % 69.1 %   Total Lymphocyte 18.9 %   Monocytes Relative 8.1 %   Eosinophils Relative 3.3 %   Basophils Relative 0.6 %  Comprehensive metabolic panel with GFR     Status: Abnormal   Collection Time: 06/22/24  9:13 AM  Result Value Ref Range   Glucose, Bld 194 (H) 65 - 99 mg/dL    Comment: .            Fasting reference interval . For someone without known diabetes, a glucose value >125 mg/dL indicates that they may have diabetes and this should be confirmed with a follow-up test. .    BUN 31 (H) 7 - 25 mg/dL   Creat 9.10 9.29 - 8.69 mg/dL   eGFR 99 > OR = 60 fO/fpw/8.26f7   BUN/Creatinine Ratio 35 (H) 6 - 22 (calc)   Sodium 134 (L) 135 - 146 mmol/L   Potassium 4.7 3.5 - 5.3 mmol/L   Chloride 101 98 - 110 mmol/L   CO2 26  20 - 32 mmol/L   Calcium  9.4 8.6 - 10.3 mg/dL   Total Protein 7.1 6.1 - 8.1 g/dL   Albumin 4.7 3.6 - 5.1 g/dL   Globulin 2.4 1.9 - 3.7 g/dL (calc)   AG Ratio 2.0 1.0 - 2.5 (calc)   Total Bilirubin 0.5 0.2 - 1.2 mg/dL   Alkaline phosphatase (APISO) 36 35 - 144 U/L   AST 14 10 - 35 U/L   ALT 16 9 - 46 U/L  Hemoglobin A1c     Status: Abnormal   Collection Time: 06/22/24  9:13 AM  Result Value Ref Range   Hgb A1c MFr Bld 6.6 (H) <5.7 %    Comment: For someone  without known diabetes, a hemoglobin A1c value of 6.5% or greater indicates that they may have  diabetes and this should be confirmed with a follow-up  test. . For someone with known diabetes, a value <7% indicates  that their diabetes is well controlled and a value  greater than or equal to 7% indicates suboptimal  control. A1c targets should be individualized based on  duration of diabetes, age, comorbid conditions, and  other considerations. . Currently, no consensus exists regarding use of hemoglobin A1c for diagnosis of diabetes for children. .    Mean Plasma Glucose 143 mg/dL   eAG (mmol/L) 7.9 mmol/L  PSA     Status: None   Collection Time: 06/22/24  9:13 AM  Result Value Ref Range   PSA 0.52 < OR = 4.00 ng/mL    Comment: The total PSA value from this assay system is  standardized against the WHO standard. The test  result will be approximately 20% lower when compared  to the equimolar-standardized total PSA (Beckman  Coulter). Comparison of serial PSA results should be  interpreted with this fact in mind. . This test was performed using the Siemens  chemiluminescent method. Values obtained from  different assay methods cannot be used interchangeably. PSA levels, regardless of value, should not be interpreted as absolute evidence of the presence or absence of disease.   Lipoprotein Fractionation, NMR with Lipid Panel (Triglycerides/HDL-C)     Status: Abnormal   Collection Time: 06/22/24  9:13 AM  Result Value Ref Range   CHOLESTEROL, TOTAL 203 (H) <200 mg/dL   HDL CHOLESTEROL 44 >60 mg/dL   TRIGLYCERIDES 894 <849 mg/dL   LDL CHOLESTEROL 861 (H) <100 mg/dL (calc)    Comment: Desirable range <100 mg/dL for primary prevention; <70 mg/dL for patients with CHD or diabetic patients with >= 2 CHD risk factors.  LDL-C is now calculated using the Martin-Hopkins calculation, which is a validated novel method providing better accuracy than the Friedewald equation in  the estimation of LDL-C. Gladis APPLETHWAITE et al. SANDREA. 7986;689(80): 2061-2068 (http://education.[facility].com/faq/FAQ164)     CHOL/HDL C 4.6 <5.0 calc   NON HDL CHOLESTEROL 159 (H) <130 mg/dL (calc)    Comment: For patients with diabetes plus 1 major ASCVD risk factor, treating to a non-HDL-C goal of <100 mg/dL (LDL-C of <29 mg/dL) is considered a therapeutic option.     TG/HDL C 2.4 (H) <2.0 calc   LDL P 1,969 (H) <935 nmol/L    Comment: This test was developed and its analytical performance characteristics have been determined by [facility]. It has not been cleared or approved by the U.S. Food and Drug Administration. This assay has been validated pursuant to the CLIA regulations and is used for clinical purposes.  Relative risk:  Optimal <935; Moderate [phone]; High >1816 nmol/L.  Reference range is [phone] nmol/L.     SMALL LDL P 916 (H) <467 nmol/L    Comment: Relative risk: Optimal <467; Moderate [id number]; High >820 nmol/L.  Reference range is <1408 nmol/L.     LDL SIZE 20.9 >20.5 nm    Comment: Relative risk: Optimal >20.5; High <20.6 nm.  Reference range is 20.0-22.3 nm.     HDL P 36.6 >32.8 umol/L    Comment: Relative risk: Optimal >32.8; Moderate 29.2-32.8; High <29.2 umol/L.  Reference range is 21.1-43.4 umol/L.     LARGE HDL P <3.0 (L) >7.2 umol/L    Comment: Relative risk: Optimal >7.2; Moderate 5.3-7.2; High <5.3 umol/L.  Reference range is >3.5  umol/L.     HDL Size 8.4 (L) >9.0 nm    Comment: Relative risk: Optimal >9.0; Moderate 8.7-9.0; High <8.7 nm.  Reference range is 8.3-10.5 nm.     LARGE VLDL P 3.0 <3.7 nmol/L    Comment: Relative risk: Optimal <3.7; Moderate 3.7-6.1; High >6.1 nmol/L.  Reference range is <16.0 nmol/L.     VLDL Size 46.6 <47.1 nm    Comment: Relative risk: Optimal <47.1; Moderate 47.1-49.0; High >49.0 nm.  Reference range is 41.1-61.7 nm.    B12 and Folate Panel      Status: None   Collection Time: 06/22/24  9:13 AM  Result Value Ref Range   Vitamin B-12 822 200 - 1,100 pg/mL   Folate 6.8 ng/mL    Comment:                            Reference Range                            Low:           <3.4                            Borderline:    3.4-5.4                            Normal:        >5.4 .   Testosterone  , Free and Total     Status: None   Collection Time: 06/22/24  9:13 AM  Result Value Ref Range   Testosterone , Total, LC-MS-MS 439 250 - 1,100 ng/dL    Comment: . For additional information, please refer to https://education.[facility].com/faq/FAQ165 (This link is being provided for informational/educational purposes only.) (Note) . This test was developed and its analytical performance  characteristics have been determined by [facility]. It has  not been cleared or approved by the FDA. This assay has  been validated pursuant to the CLIA regulations and is  used for clinical purposes. . .    Free Testosterone  79 35.0 - 155.0 pg/mL    Comment: (Note) This test was developed and its analytical performance  characteristics have been determined by [facility]. It has  not been cleared or approved by the FDA. This assay has  been validated pursuant to the CLIA regulations and is  used for clinical purposes. . [facility] [address] [place] [address] [phone] Johanna Agent L. Gino, MD, PhD     Diabetic Foot Exam:     PHQ2/9:    06/29/2024    3:04  PM 06/22/2024    8:14 AM 12/16/2023    3:21 PM 06/11/2023    3:18 PM 06/04/2023    8:33 AM  Depression screen PHQ 2/9  Decreased Interest 0 0 0 0 0  Down, Depressed, Hopeless 0 0 0 0 0  PHQ - 2 Score 0 0 0 0 0  Altered sleeping   0 1 0  Tired, decreased energy   0 1 3  Change in appetite   0 0 1  Feeling bad or failure about yourself    0 0 0  Trouble concentrating   0 0 0  Moving slowly or fidgety/restless   0 0 0  Suicidal thoughts    0 0 0  PHQ-9 Score   0 2 4  Difficult doing work/chores   Not difficult at all  Somewhat difficult    phq 9 is negative  Fall Risk:    06/29/2024    3:04 PM 06/22/2024    8:13 AM 06/11/2023    3:18 PM 03/12/2023    1:37 PM 05/28/2022    3:31 PM  Fall Risk   Falls in the past year? 0 0 0 0 0  Number falls in past yr: 0 0 0 0   Injury with Fall? 0 0 0 0   Risk for fall due to : No Fall Risks No Fall Risks No Fall Risks No Fall Risks No Fall Risks  Follow up Falls evaluation completed Falls evaluation completed Falls prevention discussed Falls prevention discussed Falls prevention discussed      Data saved with a previous flowsheet row definition      Assessment & Plan Type 2 diabetes mellitus with associated dyslipidemia Improved control with A1c at 6.6%. No basal insulin , uses NovoLog as needed and metformin . Reports frequent urination. - Continue NovoLog as needed. - Continue metformin  500 mg twice daily. - Send MyChart message to confirm metformin  dosage. - Monitor blood glucose levels with CGM.  Dyslipidemia High-risk lipid profile with high LDL particle size. Cognitive dysfunction with statins, he does not want to start any medications for cholesterol at this time - Consider Zetia  or Nexlizat as alternatives to statins. - Discuss PCSK9 inhibitors with Doctor Rosena. - Reassess lipid profile at next physical.  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 - Avoid statin use due to cognitive side effects.  Low B12 - rx sent to pharmacy   Obstructive sleep apnea Managed with CPAP, consistent use but no recent specialist consultation. - Refer to Doctor Jess at [facility] for sleep apnea management. - Consider new sleep study if needed.  Obesity BMI of 31, managed with diet and exercise.  Recurrent folliculitis Managed with doxycycline  as needed. Used doxycycline  for one week this  year for neck folliculitis. - Continue doxycycline  as needed for folliculitis.  General Health Maintenance Routine health maintenance discussed. Flu shot planned, colonoscopy due pending insurance verification. - Administer flu shot. - Verify insurance coverage and schedule colonoscopy.

## 2024-06-30 ENCOUNTER — Other Ambulatory Visit: Payer: Self-pay

## 2024-07-20 ENCOUNTER — Ambulatory Visit: Admitting: Family Medicine

## 2024-07-20 ENCOUNTER — Encounter: Payer: Self-pay | Admitting: Family Medicine

## 2024-07-20 VITALS — BP 122/74 | HR 63 | Temp 98.7°F | Resp 16 | Ht 72.0 in | Wt 235.0 lb

## 2024-07-20 DIAGNOSIS — J069 Acute upper respiratory infection, unspecified: Secondary | ICD-10-CM | POA: Diagnosis not present

## 2024-07-20 DIAGNOSIS — J302 Other seasonal allergic rhinitis: Secondary | ICD-10-CM

## 2024-07-20 DIAGNOSIS — J0100 Acute maxillary sinusitis, unspecified: Secondary | ICD-10-CM | POA: Diagnosis not present

## 2024-07-20 DIAGNOSIS — E1169 Type 2 diabetes mellitus with other specified complication: Secondary | ICD-10-CM

## 2024-07-20 DIAGNOSIS — Z7984 Long term (current) use of oral hypoglycemic drugs: Secondary | ICD-10-CM

## 2024-07-20 DIAGNOSIS — Z794 Long term (current) use of insulin: Secondary | ICD-10-CM

## 2024-07-20 DIAGNOSIS — E785 Hyperlipidemia, unspecified: Secondary | ICD-10-CM

## 2024-07-20 MED ORDER — FLUTICASONE PROPIONATE 50 MCG/ACT NA SUSP: 2.0000 | Freq: Every day | NASAL | 6 refills | Status: AC

## 2024-07-20 MED ORDER — BENZONATATE 100 MG PO CAPS: 100.0000 mg | ORAL_CAPSULE | Freq: Three times a day (TID) | ORAL | 0 refills | Status: AC | PRN

## 2024-07-20 NOTE — Progress Notes (Signed)
 Ryan Ritter

## 2024-07-20 NOTE — Progress Notes (Signed)
 Name: Ryan Ritter   MRN: [id number]    DOB: 1965-10-10   Date:07/20/2024       Progress Note  Subjective  Chief Complaint  Chief Complaint  Patient presents with   Sinusitis    X1 week   Cough    Slightly productive    Discussed the use of AI scribe software for clinical note transcription with the patient, who gave verbal consent to proceed.  History of Present Illness Ryan Ritter is a 58 year old male who presents with persistent sore throat, sinus congestion, and fatigue.  He has been feeling unwell for the past few weeks, initially feeling like he was 'fighting something.' A significant sore throat began last Tuesday, evolving into a less severe but persistent soreness. He also experiences significant sinus congestion, particularly in the maxillary region, accompanied by facial pain and a headache.  His symptoms worsened last week, leading to missed work on Thursday and Friday. Although he felt slightly better on Saturday, his condition deteriorated again by Sunday, and he was unable to go to work on Monday due to feeling significantly worse.  He describes his nasal discharge as mostly clear, with occasional green and yellow mucus over the weekend. No fever, although he has felt as though he might have one at times, but his temperature readings have been slightly below normal.  He has been using a generic form of Zyrtec and saline nasal spray, particularly before using his CPAP mask at night. He has also been gargling with salt water and took Sudafed, which provided some relief. No shortness of breath or wheezing but reports a significant decrease in energy levels and a persistent cough, especially in the morning.  Regarding his diabetes, his blood sugar levels have been higher than usual, particularly in the mornings, reaching over 200 mg/dL. However, after taking his medications and a short walk, his levels decreased to 106 mg/dL by the time of the visit.    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   Vitiligo 09/09/2018   OSA on CPAP 05/29/2017   Low HDL (under 40) 11/08/2016   Spondylolysis of cervical region 08/08/2016   Family history of colonic polyps 03/14/2015   Gastroesophageal reflux disease with esophagitis without hemorrhage 03/14/2015   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   Hypotestosteronism 03/14/2015   Family history of malignant neoplasm of prostate 05/21/2008   Hyperlipidemia LDL goal <70 01/29/2007    Social History   Tobacco Use   Smoking status: Never   Smokeless tobacco: Former    Types: Chew    Quit date: 10/01/1988  Substance Use Topics   Alcohol use: No    Alcohol/week: 0.0 standard drinks of alcohol     Current Outpatient Medications:    Accu-Chek Softclix Lancets lancets, SMARTSIG:2 Topical Twice Daily, Disp: , Rfl:    benzonatate (TESSALON) 100 MG capsule, Take 1-2 capsules (100-200 mg total) by mouth 3 (three) times daily as needed., Disp: 40 capsule, Rfl: 0   Boron 3 MG CAPS, Take 1 capsule by mouth 3 (three) times daily., Disp: , Rfl:    Chromium 200  MCG CAPS, Take 200 mcg by mouth 2 (two) times daily. , Disp: , Rfl:    clindamycin  (CLEOCIN -T) 1 % lotion, Apply topically daily. Bid to aa bumps scalp, neck, Disp: 60 mL, Rfl: 4   Coenzyme Q10 (CO Q10) 200 MG CAPS, Take 200 mg by mouth daily., Disp: , Rfl:    Continuous Blood Gluc Sensor (DEXCOM G7 SENSOR) MISC, 1 each by Does not apply route as directed. Apply one every 10 days, Disp: 9 each, Rfl: 1   Cyanocobalamin  (B-12) 1000 MCG SUBL, Place 1 tablet under the tongue 3  (three) times a week., Disp: 100 tablet, Rfl: 3   doxycycline  (MONODOX ) 100 MG capsule, Take 1 capsule (100 mg total) by mouth as directed. 1 po bid with food and drink for 2 weeks prn flares of bumps on scalp, neck (Patient taking differently: Take 100 mg by mouth as needed (Rash). 1 po bid with food and drink for 2 weeks prn flares of bumps on scalp, neck), Disp: 30 capsule, Rfl: 2   EPINEPHrine 0.3 mg/0.3 mL IJ SOAJ injection, , Disp: , Rfl:    fluticasone (FLONASE) 50 MCG/ACT nasal spray, Place 2 sprays into both nostrils daily., Disp: 16 g, Rfl: 6   glucose blood (ACCU-CHEK GUIDE) test strip, USE TO TEST UP TO 4 TIMES A DAY, Disp: 100 strip, Rfl: 2   Insulin  Pen Needle 32G X 4 MM MISC, 1 each by Does not apply route daily at 12 noon. Twice daily prn, Disp: 100 each, Rfl: 2   levocetirizine (XYZAL) 5 MG tablet, Take 5 mg by mouth every evening. , Disp: , Rfl:    lidocaine  (XYLOCAINE ) 5 % ointment, Apply 1 Application topically 2 (two) times daily as needed., Disp: 30 g, Rfl: 11   metFORMIN  (GLUCOPHAGE ) 500 MG tablet, Take 500 mg by mouth 2 (two) times daily with a meal., Disp: , Rfl:    NOVOLOG FLEXPEN 100 UNIT/ML FlexPen, Inject 6-14 Units into the skin 2 (two) times daily as needed., Disp: , Rfl:    valACYclovir  (VALTREX ) 500 MG tablet, Take 1 tablet (500 mg total) by mouth 3 (three) times daily. Prn, Disp: 30 tablet, Rfl: 0  Allergies  Allergen Reactions   Benicar [Olmesartan] Other (See Comments)   Crestor  [Rosuvastatin ] Other (See Comments)    Cognitive dysfunction   Lisinopril Other (See Comments)    ROS  Ten systems reviewed and is negative except as mentioned in HPI    Objective  Vitals:   07/20/24 1353  BP: 122/74  Pulse: 63  Resp: 16  Temp: 98.7 F (37.1 C)  SpO2: 97%  Weight: 235 lb (106.6 kg)  Height: 6' (1.829 m)    Body mass index is 31.87 kg/m.    Physical Exam CONSTITUTIONAL: Patient appears well-developed and well-nourished. No distress. HEENT: Head  atraumatic, normocephalic, neck supple. Ears normal bilaterally. Throat normal. Tenderness during percussion of maxillary sinus bilaterally, boggy turbinates CARDIOVASCULAR: Normal rate, regular rhythm and normal heart sounds. No murmur heard. No BLE edema. PULMONARY: Effort normal and breath sounds normal. Lungs clear to auscultation, no wheezing. No respiratory distress. ABDOMINAL: There is no tenderness or distention. MUSCULOSKELETAL: Normal gait. Without gross motor or sensory deficit. PSYCHIATRIC: Patient has a normal mood and affect. Behavior is normal. Judgment and thought content normal.  Recent Results (from the past 2160 hours)  HM DIABETES EYE EXAM     Status: None   Collection Time: 05/08/24  7:31 AM  Result Value Ref Range   HM Diabetic  Eye Exam No Retinopathy No Retinopathy    Comment: Abstracted by HIM  Urine Microalbumin w/creat. ratio     Status: None   Collection Time: 06/22/24  9:13 AM  Result Value Ref Range   Creatinine, Urine 87 20 - 320 mg/dL   Microalb, Ur 0.6 mg/dL    Comment: Reference Range Not established    Microalb Creat Ratio 7 <30 mg/g creat    Comment: . The ADA defines abnormalities in albumin excretion as follows: SABRA Albuminuria Category        Result (mg/g creatinine) . Normal to Mildly increased   <30 Moderately increased         30-299  Severely increased           > OR = 300 . The ADA recommends that at least two of three specimens collected within a 3-6 month period be abnormal before considering a patient to be within a diagnostic category.   CBC with Differential/Platelet     Status: None   Collection Time: 06/22/24  9:13 AM  Result Value Ref Range   WBC 5.1 3.8 - 10.8 Thousand/uL   RBC 4.98 4.20 - 5.80 Million/uL   Hemoglobin 15.0 13.2 - 17.1 g/dL   HCT 54.2 61.4 - 49.9 %   MCV 91.8 80.0 - 100.0 fL   MCH 30.1 27.0 - 33.0 pg   MCHC 32.8 32.0 - 36.0 g/dL    Comment: For adults, a slight decrease in the calculated MCHC value (in  the range of 30 to 32 g/dL) is most likely not clinically significant; however, it should be interpreted with caution in correlation with other red cell parameters and the patient's clinical condition.    RDW 13.0 11.0 - 15.0 %   Platelets 140 140 - 400 Thousand/uL   MPV 10.8 7.5 - 12.5 fL   Neutro Abs 3,524 1,500 - 7,800 cells/uL   Absolute Lymphocytes 964 850 - 3,900 cells/uL   Absolute Monocytes 413 200 - 950 cells/uL   Eosinophils Absolute 168 15 - 500 cells/uL   Basophils Absolute 31 0 - 200 cells/uL   Neutrophils Relative % 69.1 %   Total Lymphocyte 18.9 %   Monocytes Relative 8.1 %   Eosinophils Relative 3.3 %   Basophils Relative 0.6 %  Comprehensive metabolic panel with GFR     Status: Abnormal   Collection Time: 06/22/24  9:13 AM  Result Value Ref Range   Glucose, Bld 194 (H) 65 - 99 mg/dL    Comment: .            Fasting reference interval . For someone without known diabetes, a glucose value >125 mg/dL indicates that they may have diabetes and this should be confirmed with a follow-up test. .    BUN 31 (H) 7 - 25 mg/dL   Creat 9.10 9.29 - 8.69 mg/dL   eGFR 99 > OR = 60 fO/fpw/8.26f7   BUN/Creatinine Ratio 35 (H) 6 - 22 (calc)   Sodium 134 (L) 135 - 146 mmol/L   Potassium 4.7 3.5 - 5.3 mmol/L   Chloride 101 98 - 110 mmol/L   CO2 26 20 - 32 mmol/L   Calcium  9.4 8.6 - 10.3 mg/dL   Total Protein 7.1 6.1 - 8.1 g/dL   Albumin 4.7 3.6 - 5.1 g/dL   Globulin 2.4 1.9 - 3.7 g/dL (calc)   AG Ratio 2.0 1.0 - 2.5 (calc)   Total Bilirubin 0.5 0.2 - 1.2 mg/dL   Alkaline phosphatase (APISO) 36  35 - 144 U/L   AST 14 10 - 35 U/L   ALT 16 9 - 46 U/L  Hemoglobin A1c     Status: Abnormal   Collection Time: 06/22/24  9:13 AM  Result Value Ref Range   Hgb A1c MFr Bld 6.6 (H) <5.7 %    Comment: For someone without known diabetes, a hemoglobin A1c value of 6.5% or greater indicates that they may have  diabetes and this should be confirmed with a follow-up  test. . For  someone with known diabetes, a value <7% indicates  that their diabetes is well controlled and a value  greater than or equal to 7% indicates suboptimal  control. A1c targets should be individualized based on  duration of diabetes, age, comorbid conditions, and  other considerations. . Currently, no consensus exists regarding use of hemoglobin A1c for diagnosis of diabetes for children. .    Mean Plasma Glucose 143 mg/dL   eAG (mmol/L) 7.9 mmol/L  PSA     Status: None   Collection Time: 06/22/24  9:13 AM  Result Value Ref Range   PSA 0.52 < OR = 4.00 ng/mL    Comment: The total PSA value from this assay system is  standardized against the WHO standard. The test  result will be approximately 20% lower when compared  to the equimolar-standardized total PSA (Beckman  Coulter). Comparison of serial PSA results should be  interpreted with this fact in mind. . This test was performed using the Siemens  chemiluminescent method. Values obtained from  different assay methods cannot be used interchangeably. PSA levels, regardless of value, should not be interpreted as absolute evidence of the presence or absence of disease.   Lipoprotein Fractionation, NMR with Lipid Panel (Triglycerides/HDL-C)     Status: Abnormal   Collection Time: 06/22/24  9:13 AM  Result Value Ref Range   CHOLESTEROL, TOTAL 203 (H) <200 mg/dL   HDL CHOLESTEROL 44 >60 mg/dL   TRIGLYCERIDES 894 <849 mg/dL   LDL CHOLESTEROL 861 (H) <100 mg/dL (calc)    Comment: Desirable range <100 mg/dL for primary prevention; <70 mg/dL for patients with CHD or diabetic patients with >= 2 CHD risk factors.  LDL-C is now calculated using the Martin-Hopkins calculation, which is a validated novel method providing better accuracy than the Friedewald equation in the estimation of LDL-C. Gladis APPLETHWAITE et al. SANDREA. 7986;689(80): 2061-2068 (http://education.[facility].com/faq/FAQ164)     CHOL/HDL C 4.6 <5.0 calc   NON HDL  CHOLESTEROL 159 (H) <130 mg/dL (calc)    Comment: For patients with diabetes plus 1 major ASCVD risk factor, treating to a non-HDL-C goal of <100 mg/dL (LDL-C of <29 mg/dL) is considered a therapeutic option.     TG/HDL C 2.4 (H) <2.0 calc   LDL P 1,969 (H) <935 nmol/L    Comment: This test was developed and its analytical performance characteristics have been determined by [facility]. It has not been cleared or approved by the U.S. Food and Drug Administration. This assay has been validated pursuant to the CLIA regulations and is used for clinical purposes.  Relative risk: Optimal <935; Moderate [phone]; High >1816 nmol/L.  Reference range is [phone] nmol/L.     SMALL LDL P 916 (H) <467 nmol/L    Comment: Relative risk: Optimal <467; Moderate [id number]; High >820 nmol/L.  Reference range is <1408 nmol/L.     LDL SIZE 20.9 >20.5 nm    Comment: Relative risk: Optimal >20.5; High <20.6  nm.  Reference range is 20.0-22.3 nm.     HDL P 36.6 >32.8 umol/L    Comment: Relative risk: Optimal >32.8; Moderate 29.2-32.8; High <29.2 umol/L.  Reference range is 21.1-43.4 umol/L.     LARGE HDL P <3.0 (L) >7.2 umol/L    Comment: Relative risk: Optimal >7.2; Moderate 5.3-7.2; High <5.3 umol/L.  Reference range is >3.5  umol/L.     HDL Size 8.4 (L) >9.0 nm    Comment: Relative risk: Optimal >9.0; Moderate 8.7-9.0; High <8.7 nm.  Reference range is 8.3-10.5 nm.     LARGE VLDL P 3.0 <3.7 nmol/L    Comment: Relative risk: Optimal <3.7; Moderate 3.7-6.1; High >6.1 nmol/L.  Reference range is <16.0 nmol/L.     VLDL Size 46.6 <47.1 nm    Comment: Relative risk: Optimal <47.1; Moderate 47.1-49.0; High >49.0 nm.  Reference range is 41.1-61.7 nm.    B12 and Folate Panel     Status: None   Collection Time: 06/22/24  9:13 AM  Result Value Ref Range   Vitamin B-12 822 200 - 1,100 pg/mL   Folate 6.8 ng/mL    Comment:                             Reference Range                            Low:           <3.4                            Borderline:    3.4-5.4                            Normal:        >5.4 .   Testosterone  , Free and Total     Status: None   Collection Time: 06/22/24  9:13 AM  Result Value Ref Range   Testosterone , Total, LC-MS-MS 439 250 - 1,100 ng/dL    Comment: . For additional information, please refer to https://education.[facility].com/faq/FAQ165 (This link is being provided for informational/educational purposes only.) (Note) . This test was developed and its analytical performance  characteristics have been determined by [facility]. It has  not been cleared or approved by the FDA. This assay has  been validated pursuant to the CLIA regulations and is  used for clinical purposes. . .    Free Testosterone  79 35.0 - 155.0 pg/mL    Comment: (Note) This test was developed and its analytical performance  characteristics have been determined by [facility]. It has  not been cleared or approved by the FDA. This assay has  been validated pursuant to the CLIA regulations and is  used for clinical purposes. . [facility] [address] [place] [address] [phone] Johanna Agent L. Frame, MD, PhD       Assessment & Plan  Acute sinusitis with sore throat and cough/likely viral at this time  Symptoms persistent with worsening sore throat, sinus pain, congestion, headache, and cough. Nasal discharge mostly clear with some green/yellow. No fever. Differential includes viral, bacterial, and allergies. No antibiotics due to lack of bacterial signs. - Advise rest and increased fluid intake. - Continue saline nasal spray multiple times a day. - Prescribe Flonase for short-term use. - Prescribe Tessalon  Perles for cough. - Continue Sudafed in the morning, avoid in the afternoon. - Advise zinc and vitamin C supplements temporarily. - Provide work excuse from  Monday through Friday. - Instruct to report worsening symptoms or persistent thick yellow/green discharge for potential antibiotics. Azithromycin  500 mg for 3 days if needed   Allergic rhinitis Chronic condition worsened by seasonal allergens. Managed with Zyrtec and saline spray. Discussed short-term Flonase use despite blood sugar concerns. - Prescribe Flonase for short-term use. - Continue Zyrtec. - Advise saline nasal spray.  Type 2 diabetes mellitus with dyslipidemia Elevated blood sugar likely due to stress and illness. Morning levels over 200 mg/dL, decreased to 893 mg/dL later. - Monitor blood sugar levels. - Advise dietary monitoring. - Encourage increased fluid intake.

## 2024-07-21 ENCOUNTER — Ambulatory Visit: Admitting: Sleep Medicine

## 2024-08-06 ENCOUNTER — Ambulatory Visit (INDEPENDENT_AMBULATORY_CARE_PROVIDER_SITE_OTHER): Admitting: Sleep Medicine

## 2024-08-06 ENCOUNTER — Encounter: Payer: Self-pay | Admitting: Sleep Medicine

## 2024-08-06 VITALS — BP 110/70 | HR 54 | Temp 98.4°F | Ht 72.0 in | Wt 244.8 lb

## 2024-08-06 DIAGNOSIS — G4733 Obstructive sleep apnea (adult) (pediatric): Secondary | ICD-10-CM | POA: Diagnosis not present

## 2024-08-06 DIAGNOSIS — E669 Obesity, unspecified: Secondary | ICD-10-CM

## 2024-08-06 NOTE — Patient Instructions (Signed)

## 2024-08-06 NOTE — Progress Notes (Signed)
 Name:Ryan Ritter MRN: [id number] DOB: 03-06-1966   CHIEF COMPLAINT:  REESTABLISH CARE FOR OSA   HISTORY OF PRESENT ILLNESS: Ryan Ritter is a 58 y.o. w/ a h/o OSA, DMII and obesity presents to reestablish care for OSA. Reports that he was initially diagnosed with OSA several years ago and was subsequently started on CPAP therapy. Reports using CPAP therapy every night, which is confirmed by compliance data. He is currently using the Airfit P10 nasal pillow mask, which is comfortable. Reports daytime sleepiness despite using CPAP therapy.   Bedtime 10 pm-12 am Sleep onset 5 mins Rise time 5-5:30 am   EPWORTH SLEEP SCORE     07/01/2017    4:00 PM  Results of the Epworth flowsheet  Sitting and reading 2  Watching TV 1  Sitting, inactive in a public place (e.g. a theatre or a meeting) 2  As a passenger in a car for an hour without a break 1  Lying down to rest in the afternoon when circumstances permit 3  Sitting and talking to someone 0  Sitting quietly after a lunch without alcohol 2  In a car, while stopped for a few minutes in traffic 1  Total score 12    PAST MEDICAL HISTORY :   has a past medical history of Allergy , Benign essential HTN (01/29/2007), Diabetes mellitus without complication (HCC), Hyperlipidemia, Mass of left thigh (10/09/2018), Obesity (03/14/2015), OSA on CPAP (05/29/2017), Plantar fasciitis (09/30/2015), and Sleep apnea.  has a past surgical history that includes tendinitis (Left); vascectomy (08/2014); Trigger finger release; Anal fissure repair; Metatarsal osteotomy (Left, 03/02/2022); and Hammer toe surgery (Left, 03/02/2022). Prior to Admission medications   Medication Sig Start Date End Date Taking? Authorizing Provider  Accu-Chek Softclix Lancets lancets SMARTSIG:2 Topical Twice Daily 12/14/21   [provider]  benzonatate (TESSALON) 100 MG capsule Take 1-2 capsules (100-200 mg total) by mouth 3 (three) times daily as needed. 07/20/24   Sowles,  Krichna, MD  Boron 3 MG CAPS Take 1 capsule by mouth 3 (three) times daily.    [provider]  Chromium 200 MCG CAPS Take 200 mcg by mouth 2 (two) times daily.     [provider]  clindamycin  (CLEOCIN -T) 1 % lotion Apply topically daily. Bid to aa bumps scalp, neck 01/30/24 01/29/25  Claudene Lehmann, MD  Coenzyme Q10 (CO Q10) 200 MG CAPS Take 200 mg by mouth daily.    [provider]  Continuous Blood Gluc Sensor (DEXCOM G7 SENSOR) MISC 1 each by Does not apply route as directed. Apply one every 10 days 12/12/21   Sowles, Krichna, MD  Cyanocobalamin  (B-12) 1000 MCG SUBL Place 1 tablet under the tongue 3 (three) times a week. 06/29/24   Sowles, Krichna, MD  doxycycline  (MONODOX ) 100 MG capsule Take 1 capsule (100 mg total) by mouth as directed. 1 po bid with food and drink for 2 weeks prn flares of bumps on scalp, neck Patient taking differently: Take 100 mg by mouth as needed (Rash). 1 po bid with food and drink for 2 weeks prn flares of bumps on scalp, neck 01/30/24   Claudene Lehmann, MD  EPINEPHrine 0.3 mg/0.3 mL IJ SOAJ injection     [provider]  fluticasone (FLONASE) 50 MCG/ACT nasal spray Place 2 sprays into both nostrils daily. 07/20/24   Sowles, Krichna, MD  glucose blood (ACCU-CHEK GUIDE) test strip USE TO TEST UP TO 4 TIMES A DAY 01/15/22   Sowles, Krichna, MD  Insulin  Pen Needle 32G X 4 MM MISC 1 each by Does not apply route daily at 12 noon. Twice daily prn 12/18/21   Sowles, Krichna, MD  levocetirizine (XYZAL) 5 MG tablet Take 5 mg by mouth every evening.  03/05/15   [provider]  lidocaine  (XYLOCAINE ) 5 % ointment Apply 1 Application topically 2 (two) times daily as needed. 03/09/24   Claudene Lehmann, MD  metFORMIN  (GLUCOPHAGE ) 500 MG tablet Take 500 mg by mouth 2 (two) times daily with a meal.    [provider]  NOVOLOG FLEXPEN 100 UNIT/ML FlexPen Inject 6-14 Units into the skin 2 (two) times daily as needed. 02/16/22   Deward Diedra NOVAK, MD  valACYclovir  (VALTREX ) 500 MG tablet Take 1 tablet (500 mg total) by mouth 3 (three) times daily. Prn 12/16/23   Sowles, Krichna, MD   Allergies  Allergen Reactions   Benicar [Olmesartan] Other (See Comments)   Crestor  [Rosuvastatin ] Other (See Comments)    Cognitive dysfunction   Lisinopril Other (See Comments)    FAMILY HISTORY:  family history includes Breast cancer in his mother; Cancer in his father, maternal grandfather, and paternal grandfather; Colon polyps in his mother; Diabetes in his brother, father, maternal grandmother, and paternal grandmother; Hearing loss in his father; Leukemia in his maternal grandfather; Multiple myeloma in his paternal grandfather; Pancreatic cancer in his father. SOCIAL HISTORY:  reports that he has never smoked. He quit smokeless tobacco use about 35 years ago.  His smokeless tobacco use included chew. He reports that he does not drink alcohol and does not use drugs.   Review of Systems:  Gen:  Denies  fever, sweats, chills weight loss  HEENT: Denies blurred vision, double vision, ear pain, eye pain, hearing loss, nose bleeds, sore throat Cardiac:  No dizziness, chest pain or heaviness, chest tightness,edema, No JVD Resp:   No cough, -sputum production, -shortness of breath,-wheezing, -hemoptysis,  Gi: Denies swallowing difficulty, stomach pain, nausea or vomiting, diarrhea, constipation, bowel incontinence Gu:  Denies bladder incontinence, burning urine Ext:   Denies Joint pain, stiffness or swelling Skin: Denies  skin rash, easy bruising or bleeding or hives Endoc:  Denies polyuria, polydipsia , polyphagia or weight change Psych:   Denies depression, insomnia or hallucinations  Other:  All other systems negative  VITAL SIGNS: BP 110/70   Pulse (!) 54   Temp 98.4 F (36.9 C)   Ht 6' (1.829 m)   Wt 244 lb 12.8 oz (111 kg)   SpO2 99%   BMI 33.20 kg/m     Physical Examination:   General Appearance: No distress  EYES  PERRLA, EOM intact.   NECK Supple, No JVD Pulmonary: normal breath sounds, No wheezing.  CardiovascularNormal S1,S2.  No m/r/g.   Abdomen: Benign, Soft, non-tender. Skin:   warm, no rashes, no ecchymosis  Extremities: normal, no cyanosis, clubbing. Neuro:without focal findings,  speech normal  PSYCHIATRIC: Mood, affect within normal limits.   ASSESSMENT AND PLAN  OSA Patient is using and benefiting from CPAP therapy. Counseled patient on increasing total sleep time to 7-8 hours per night. Discussed the consequences of untreated sleep apnea. Advised not to drive drowsy for safety of patient and others. Will follow up in 3 months.     Obesity Counseled patient on diet and lifestyle modification.    Patient  satisfied with Plan of action and management. All questions answered  I spent a total of 47 minutes reviewing chart data, face-to-face evaluation with the patient, counseling and  coordination of care as detailed above.    Ruari Duggan, M.D.  Sleep Medicine [facility]

## 2024-08-24 ENCOUNTER — Encounter: Payer: Self-pay | Admitting: Dermatology

## 2024-08-25 ENCOUNTER — Other Ambulatory Visit: Payer: Self-pay | Admitting: Dermatology

## 2024-08-25 DIAGNOSIS — R208 Other disturbances of skin sensation: Secondary | ICD-10-CM

## 2024-08-25 MED ORDER — LIDOCAINE 5 % EX OINT: 1.0000 | TOPICAL_OINTMENT | Freq: Two times a day (BID) | CUTANEOUS | 5 refills | Status: AC | PRN

## 2024-09-09 ENCOUNTER — Ambulatory Visit: Admitting: Dermatology

## 2024-09-10 ENCOUNTER — Ambulatory Visit: Admitting: Dermatology

## 2024-09-10 ENCOUNTER — Encounter: Payer: Self-pay | Admitting: Dermatology

## 2024-09-10 DIAGNOSIS — L281 Prurigo nodularis: Secondary | ICD-10-CM

## 2024-09-10 DIAGNOSIS — D485 Neoplasm of uncertain behavior of skin: Secondary | ICD-10-CM

## 2024-09-10 DIAGNOSIS — L249 Irritant contact dermatitis, unspecified cause: Secondary | ICD-10-CM

## 2024-09-10 DIAGNOSIS — W908XXA Exposure to other nonionizing radiation, initial encounter: Secondary | ICD-10-CM

## 2024-09-10 DIAGNOSIS — D229 Melanocytic nevi, unspecified: Secondary | ICD-10-CM

## 2024-09-10 DIAGNOSIS — Z1283 Encounter for screening for malignant neoplasm of skin: Secondary | ICD-10-CM

## 2024-09-10 DIAGNOSIS — L578 Other skin changes due to chronic exposure to nonionizing radiation: Secondary | ICD-10-CM

## 2024-09-10 DIAGNOSIS — L821 Other seborrheic keratosis: Secondary | ICD-10-CM

## 2024-09-10 DIAGNOSIS — D1801 Hemangioma of skin and subcutaneous tissue: Secondary | ICD-10-CM

## 2024-09-10 DIAGNOSIS — L988 Other specified disorders of the skin and subcutaneous tissue: Secondary | ICD-10-CM

## 2024-09-10 DIAGNOSIS — L814 Other melanin hyperpigmentation: Secondary | ICD-10-CM

## 2024-09-10 NOTE — Progress Notes (Signed)
 Follow-Up Visit   Subjective  Ryan Ritter is a 58 y.o. male who presents for the following: Skin Cancer Screening and Full Body Skin Exam. No personal Hx of skin cancer or dysplastic nevi.   The patient presents for Total-Body Skin Exam (TBSE) for skin cancer screening and mole check. The patient has spots, moles and lesions to be evaluated, some may be new or changing and the patient may have concern these could be cancer.    The following portions of the chart were reviewed this encounter and updated as appropriate: medications, allergies, medical history  Review of Systems:  No other skin or systemic complaints except as noted in HPI or Assessment and Plan.  Objective  Well appearing patient in no apparent distress; mood and affect are within normal limits.  A full examination was performed including scalp, head, eyes, ears, nose, lips, neck, chest, axillae, abdomen, back, buttocks, bilateral upper extremities, bilateral lower extremities, hands, feet, fingers, toes, fingernails, and toenails. All findings within normal limits unless otherwise noted below.   Relevant physical exam findings are noted in the Assessment and Plan.    Assessment & Plan   SKIN CANCER SCREENING PERFORMED TODAY.  ACTINIC DAMAGE - Chronic condition, secondary to cumulative UV/sun exposure - diffuse scaly erythematous macules with underlying dyspigmentation - Recommend daily broad spectrum sunscreen SPF 30+ to sun-exposed areas, reapply every 2 hours as needed.  - Staying in the shade or wearing long sleeves, sun glasses (UVA+UVB protection) and wide brim hats (4-inch brim around the entire circumference of the hat) are also recommended for sun protection.  - Call for new or changing lesions.  LENTIGINES, SEBORRHEIC KERATOSES, HEMANGIOMAS - Benign normal skin lesions - Benign-appearing - Call for any changes  MELANOCYTIC NEVI - Tan-brown and/or pink-flesh-colored symmetric macules and papules -  Benign appearing on exam today - Observation - Call clinic for new or changing moles - Recommend daily use of broad spectrum spf 30+ sunscreen to sun-exposed areas.    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 Condition is bothersome/symptomatic for patient. Currently flared.   Lichen simplex chronicus [facility]) is a persistent itchy area of thickened skin that is induced by chronic rubbing and/or scratching (chronic dermatitis).  These areas may be pink, hyperpigmented and may have excoriations and bumps (prurigo nodules-PN).  PN/LSC is commonly observed in uncontrolled atopic dermatitis and other forms of eczema, and in other itchy skin conditions (eg, insect bites, scabies).  Sometimes it is not possible to know initial cause of PN/LSC if it has been present for a long time.  It generally responds well to treatment with high potency topical steroids.  It is important to stop rubbing/scratching the area in order to break the itch-scratch-rash-itch cycle, in order for the rash to resolve.   Treatment Plan: Discussed treatment options of shave removal, topical corticosteroid. Patient deferred treatment at this time.  Avoid picking/rubbing/scratching    Neoplasm of skin Moveable subcutaneous papule at R distal dorsal forearm, non tender.   Exam: discussed biopsy, will watch for changes.     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 Use as directed.  ACTINIC ELASTOSIS   LENTIGINES   MULTIPLE BENIGN NEVI   SEBORRHEIC KERATOSES   CHERRY  ANGIOMA   PRURIGO NODULARIS   IRRITANT CONTACT DERMATITIS, UNSPECIFIED  TRIGGER   Return in about 1 year (around 09/10/2025) for TBSE.  I, Kate Fought, CMA, am acting as scribe for Boneta Sharps, MD.   Documentation: I have reviewed the above documentation for accuracy and completeness, and I agree with the above.  Boneta Sharps, MD

## 2024-09-10 NOTE — Patient Instructions (Signed)

## 2025-07-05 ENCOUNTER — Encounter: Admitting: Family Medicine

## 2025-07-12 ENCOUNTER — Ambulatory Visit: Admitting: Family Medicine

## 2025-09-09 ENCOUNTER — Ambulatory Visit: Admitting: Dermatology
